# Patient Record
Sex: Male | Born: 1970 | Race: Black or African American | Hispanic: No | Smoking: Current some day smoker
Health system: Southern US, Community
[De-identification: ages and names within clinical notes are randomized; demographics above are authoritative.]

## PROBLEM LIST (undated history)

## (undated) DIAGNOSIS — I639 Cerebral infarction, unspecified: Secondary | ICD-10-CM

## (undated) DIAGNOSIS — I10 Essential (primary) hypertension: Secondary | ICD-10-CM

## (undated) DIAGNOSIS — E119 Type 2 diabetes mellitus without complications: Secondary | ICD-10-CM

## (undated) DIAGNOSIS — Z21 Asymptomatic human immunodeficiency virus [HIV] infection status: Secondary | ICD-10-CM

## (undated) DIAGNOSIS — G510 Bell's palsy: Secondary | ICD-10-CM

## (undated) DIAGNOSIS — B20 Human immunodeficiency virus [HIV] disease: Secondary | ICD-10-CM

## (undated) HISTORY — DX: Bell's palsy: G51.0

## (undated) HISTORY — PX: NO PAST SURGERIES: SHX2092

## (undated) HISTORY — PX: HEMORRHOID SURGERY: SHX153

---

## 1998-12-20 ENCOUNTER — Emergency Department (HOSPITAL_COMMUNITY): Admission: EM | Admit: 1998-12-20 | Discharge: 1998-12-20 | Payer: Self-pay | Admitting: Emergency Medicine

## 1999-11-08 ENCOUNTER — Emergency Department (HOSPITAL_COMMUNITY): Admission: EM | Admit: 1999-11-08 | Discharge: 1999-11-08 | Payer: Self-pay | Admitting: Emergency Medicine

## 1999-11-10 ENCOUNTER — Ambulatory Visit (HOSPITAL_COMMUNITY): Admission: RE | Admit: 1999-11-10 | Discharge: 1999-11-10 | Payer: Self-pay | Admitting: Family Medicine

## 1999-11-10 ENCOUNTER — Encounter: Payer: Self-pay | Admitting: Family Medicine

## 2001-04-10 ENCOUNTER — Emergency Department (HOSPITAL_COMMUNITY): Admission: EM | Admit: 2001-04-10 | Discharge: 2001-04-10 | Payer: Self-pay | Admitting: Emergency Medicine

## 2001-04-10 ENCOUNTER — Encounter: Payer: Self-pay | Admitting: Emergency Medicine

## 2001-12-28 ENCOUNTER — Encounter: Payer: Self-pay | Admitting: Emergency Medicine

## 2001-12-28 ENCOUNTER — Emergency Department (HOSPITAL_COMMUNITY): Admission: EM | Admit: 2001-12-28 | Discharge: 2001-12-28 | Payer: Self-pay | Admitting: Emergency Medicine

## 2002-07-02 ENCOUNTER — Emergency Department (HOSPITAL_COMMUNITY): Admission: EM | Admit: 2002-07-02 | Discharge: 2002-07-02 | Payer: Self-pay

## 2003-04-20 ENCOUNTER — Emergency Department (HOSPITAL_COMMUNITY): Admission: EM | Admit: 2003-04-20 | Discharge: 2003-04-20 | Payer: Self-pay | Admitting: Emergency Medicine

## 2003-05-04 ENCOUNTER — Emergency Department (HOSPITAL_COMMUNITY): Admission: EM | Admit: 2003-05-04 | Discharge: 2003-05-04 | Payer: Self-pay | Admitting: Family Medicine

## 2003-10-05 ENCOUNTER — Emergency Department (HOSPITAL_COMMUNITY): Admission: EM | Admit: 2003-10-05 | Discharge: 2003-10-05 | Payer: Self-pay | Admitting: Emergency Medicine

## 2005-09-21 ENCOUNTER — Emergency Department (HOSPITAL_COMMUNITY): Admission: EM | Admit: 2005-09-21 | Discharge: 2005-09-21 | Payer: Self-pay | Admitting: Emergency Medicine

## 2005-09-22 ENCOUNTER — Emergency Department (HOSPITAL_COMMUNITY): Admission: EM | Admit: 2005-09-22 | Discharge: 2005-09-22 | Payer: Self-pay | Admitting: Emergency Medicine

## 2007-04-12 ENCOUNTER — Emergency Department (HOSPITAL_COMMUNITY): Admission: EM | Admit: 2007-04-12 | Discharge: 2007-04-12 | Payer: Self-pay | Admitting: Emergency Medicine

## 2008-03-11 ENCOUNTER — Emergency Department (HOSPITAL_COMMUNITY): Admission: EM | Admit: 2008-03-11 | Discharge: 2008-03-11 | Payer: Self-pay | Admitting: Emergency Medicine

## 2009-02-25 ENCOUNTER — Emergency Department (HOSPITAL_COMMUNITY): Admission: EM | Admit: 2009-02-25 | Discharge: 2009-02-25 | Payer: Self-pay | Admitting: Emergency Medicine

## 2009-10-01 ENCOUNTER — Emergency Department (HOSPITAL_COMMUNITY): Admission: EM | Admit: 2009-10-01 | Discharge: 2009-10-01 | Payer: Self-pay | Admitting: Emergency Medicine

## 2009-10-09 ENCOUNTER — Emergency Department (HOSPITAL_COMMUNITY): Admission: EM | Admit: 2009-10-09 | Discharge: 2009-10-09 | Payer: Self-pay | Admitting: Emergency Medicine

## 2010-06-17 LAB — URINE MICROSCOPIC-ADD ON

## 2010-06-17 LAB — URINALYSIS, ROUTINE W REFLEX MICROSCOPIC
Bilirubin Urine: NEGATIVE
Glucose, UA: NEGATIVE mg/dL
Hgb urine dipstick: NEGATIVE
Ketones, ur: NEGATIVE mg/dL
Nitrite: NEGATIVE
Protein, ur: 30 mg/dL — AB
Specific Gravity, Urine: 1.011 (ref 1.005–1.030)
Urobilinogen, UA: 0.2 mg/dL (ref 0.0–1.0)
pH: 8 (ref 5.0–8.0)

## 2010-06-17 LAB — DIFFERENTIAL
Basophils Absolute: 0 K/uL (ref 0.0–0.1)
Basophils Relative: 0 % (ref 0–1)
Eosinophils Absolute: 0.1 K/uL (ref 0.0–0.7)
Eosinophils Relative: 1 % (ref 0–5)
Lymphocytes Relative: 47 % — ABNORMAL HIGH (ref 12–46)
Lymphs Abs: 3.9 K/uL (ref 0.7–4.0)
Monocytes Absolute: 0.9 K/uL (ref 0.1–1.0)
Monocytes Relative: 10 % (ref 3–12)
Neutro Abs: 3.4 K/uL (ref 1.7–7.7)
Neutrophils Relative %: 41 % — ABNORMAL LOW (ref 43–77)

## 2010-06-17 LAB — COMPREHENSIVE METABOLIC PANEL
ALT: 25 U/L (ref 0–53)
Alkaline Phosphatase: 60 U/L (ref 39–117)
BUN: 7 mg/dL (ref 6–23)
CO2: 26 mEq/L (ref 19–32)
Creatinine, Ser: 0.96 mg/dL (ref 0.4–1.5)
Total Bilirubin: 0.7 mg/dL (ref 0.3–1.2)

## 2010-06-17 LAB — HEMOCCULT GUIAC POC 1CARD (OFFICE): Fecal Occult Bld: NEGATIVE

## 2010-06-17 LAB — CBC
Hemoglobin: 16.5 g/dL (ref 13.0–17.0)
RBC: 5.56 MIL/uL (ref 4.22–5.81)
RDW: 13.7 % (ref 11.5–15.5)
WBC: 8.2 10*3/uL (ref 4.0–10.5)

## 2010-06-17 LAB — LIPASE, BLOOD: Lipase: 32 U/L (ref 11–59)

## 2010-12-19 LAB — DIFFERENTIAL
Basophils Relative: 1 % (ref 0–1)
Lymphocytes Relative: 48 % — ABNORMAL HIGH (ref 12–46)
Monocytes Absolute: 1.2 10*3/uL — ABNORMAL HIGH (ref 0.1–1.0)
Monocytes Relative: 10 % (ref 3–12)

## 2010-12-19 LAB — BASIC METABOLIC PANEL
Creatinine, Ser: 1 mg/dL (ref 0.4–1.5)
GFR calc Af Amer: >60 mL/min (ref >60)
GFR calc non Af Amer: >60 mL/min (ref >60)
Potassium: 3.6 mEq/L (ref 3.5–5.1)

## 2010-12-19 LAB — CBC
HCT: 45 % (ref 39.0–52.0)
MCV: 86.3 fL (ref 78.0–100.0)

## 2011-01-20 ENCOUNTER — Emergency Department (HOSPITAL_COMMUNITY)
Admission: EM | Admit: 2011-01-20 | Discharge: 2011-01-21 | Disposition: A | Discharge status: Home / Self Care | Payer: Self-pay | Attending: Emergency Medicine | Admitting: Emergency Medicine

## 2011-01-20 ENCOUNTER — Encounter: Payer: Self-pay | Admitting: *Deleted

## 2011-01-20 DIAGNOSIS — Z79899 Other long term (current) drug therapy: Secondary | ICD-10-CM | POA: Insufficient documentation

## 2011-01-20 DIAGNOSIS — R220 Localized swelling, mass and lump, head: Secondary | ICD-10-CM | POA: Insufficient documentation

## 2011-01-20 DIAGNOSIS — F17200 Nicotine dependence, unspecified, uncomplicated: Secondary | ICD-10-CM | POA: Insufficient documentation

## 2011-01-20 DIAGNOSIS — K047 Periapical abscess without sinus: Secondary | ICD-10-CM | POA: Insufficient documentation

## 2011-01-20 DIAGNOSIS — R221 Localized swelling, mass and lump, neck: Secondary | ICD-10-CM | POA: Insufficient documentation

## 2011-01-20 NOTE — ED Notes (Signed)
Rt face swollen this am with pain.

## 2011-01-21 MED ORDER — PENICILLIN V POTASSIUM 500 MG PO TABS: 500.0000 mg | ORAL_TABLET | Freq: Four times a day (QID) | ORAL | Status: AC

## 2011-01-21 MED ORDER — OXYCODONE-ACETAMINOPHEN 5-325 MG PO TABS
1.0000 | ORAL_TABLET | Freq: Once | ORAL | Status: AC
  Administered 2011-01-21: 1 via ORAL
  Filled 2011-01-21: qty 1

## 2011-01-21 MED ORDER — OXYCODONE-ACETAMINOPHEN 5-325 MG PO TABS: 1.0000 | ORAL_TABLET | ORAL | Status: AC | PRN

## 2011-01-21 NOTE — ED Notes (Signed)
To CT

## 2011-01-21 NOTE — ED Notes (Signed)
Mentioned L jaw swelling (in previous note) was d/t positioning, no L jaw swelling noted, swelling present and significant in the R maxillary sinus area, (R of nose), (denies: change in hearing or vision, fever, nvd, drainage in mouth nose or externally). Noticed last week, but went away and returned yesterday, "rapidly progressively worse".

## 2011-01-21 NOTE — ED Notes (Signed)
L jaw noted to be swollen.

## 2011-01-21 NOTE — ED Notes (Signed)
Alert, interactive, calm, skin W&D, resps e/u, NAD, speaking in clear complete sentences, sitting on edge of bed, family at [facility]. [facility].

## 2011-01-21 NOTE — ED Notes (Signed)
Alert, NAD, calm, interactive.  

## 2011-01-21 NOTE — ED Provider Notes (Signed)
History     CSN: [id number] Arrival date & time: 01/20/2011  9:15 PM   First MD Initiated Contact with Patient 01/21/11 0155      Chief Complaint  Patient presents with  . Facial Swelling    (Consider location/radiation/quality/duration/timing/severity/associated sxs/prior treatment) The history is provided by the patient.   patient has had swelling in his right face for the last day or 2 with increasing pain. No fevers. He states he is missing some fillings on that side. States he is able to eat he just chews on that side. No fevers. No drainage no trouble seeing. No eye pain.  History reviewed. No pertinent past medical history.  History reviewed. No pertinent past surgical history.  No family history on file.  History  Substance Use Topics  . Smoking status: Current Everyday Smoker  . Smokeless tobacco: Not on file  . Alcohol Use: Yes      Review of Systems  Constitutional: Negative for activity change and appetite change.  HENT: Positive for facial swelling. Negative for ear pain, drooling, mouth sores and ear discharge.   Respiratory: Negative for choking.   Cardiovascular: Negative for chest pain.    Allergies  Review of patient's allergies indicates no known allergies.  Home Medications   Current Outpatient Rx  Name Route Sig Dispense Refill  . ACETAMINOPHEN 500 MG PO TABS Oral Take 1,000 mg by mouth 3 (three) times daily as needed. For pain     . BC HEADACHE POWDER PO Oral Take 1 packet by mouth 2 (two) times daily as needed. For pain     . OXYCODONE-ACETAMINOPHEN 5-325 MG PO TABS Oral Take 1 tablet by mouth every 4 (four) hours as needed for pain. 15 tablet 0  . PENICILLIN V POTASSIUM 500 MG PO TABS Oral Take 1 tablet (500 mg total) by mouth 4 (four) times daily. 40 tablet 0    BP 161/99  Pulse 86  Temp(Src) 98.6 F (37 C) (Oral)  Resp 20  Ht 5\' 7"  (1.702 m)  Wt 192 lb (87.091 kg)  BMI 30.07 kg/m2  SpO2 97%  Physical Exam  Constitutional: He  appears well-developed and well-nourished.  HENT:       Swelling of the right cheek. Fluctuant mass in the right upper jaw area on the buchal side. Mild erythema. Overall good dentition. No palate swelling.  Eyes: Pupils are equal, round, and reactive to light.  Neck: Normal range of motion.  Cardiovascular: Normal rate.     ED Course  INCISION AND DRAINAGE Date/Time: 01/21/2011 3:00 AM Performed by: Benjiman Core R. Authorized by: Billee Cashing Consent: Verbal consent obtained. Written consent not obtained. Risks and benefits: risks, benefits and alternatives were discussed Consent given by: patient Patient understanding: patient states understanding of the procedure being performed Patient consent: the patient's understanding of the procedure matches consent given Procedure consent: procedure consent matches procedure scheduled Relevant documents: relevant documents present and verified Test results: test results available and properly labeled Site marked: the operative site was not marked Imaging studies: imaging studies not available Required items: required blood products, implants, devices, and special equipment available Patient identity confirmed: verbally with patient and arm band Time out: Immediately prior to procedure a "time out" was called to verify the correct patient, procedure, equipment, support staff and site/side marked as required. Type: abscess Body area: mouth Location details: alveolar process Anesthesia: local infiltration Local anesthetic: lidocaine 2% with epinephrine Anesthetic total: 2 ml Patient sedated: no Scalpel size: 11 Incision  type: single straight Complexity: simple Drainage: purulent Drainage amount: moderate Wound treatment: wound left open Patient tolerance: Patient tolerated the procedure well with no immediate complications.   (including critical care time)  Labs Reviewed - No data to display No results found.   1.  Dental abscess       MDM  Right-sided dental abscess post drainage. Patient feels better after draining. We'll treat with antibiotics and pain relief. Dental followup.        Juliet Rude. Rubin Payor, MD 01/21/11 [phone]

## 2011-01-21 NOTE — ED Notes (Signed)
I&D tray at [facility], EDP Dr. Rubin Payor into see & update pt.

## 2011-04-27 ENCOUNTER — Emergency Department (HOSPITAL_COMMUNITY)
Admission: EM | Admit: 2011-04-27 | Discharge: 2011-04-27 | Disposition: A | Discharge status: Home / Self Care | Payer: Self-pay | Attending: Emergency Medicine | Admitting: Emergency Medicine

## 2011-04-27 ENCOUNTER — Encounter (HOSPITAL_COMMUNITY): Payer: Self-pay | Admitting: Emergency Medicine

## 2011-04-27 DIAGNOSIS — K029 Dental caries, unspecified: Secondary | ICD-10-CM | POA: Insufficient documentation

## 2011-04-27 DIAGNOSIS — K089 Disorder of teeth and supporting structures, unspecified: Secondary | ICD-10-CM | POA: Insufficient documentation

## 2011-04-27 MED ORDER — OXYCODONE-ACETAMINOPHEN 5-325 MG PO TABS: 2.0000 | ORAL_TABLET | ORAL | Status: AC | PRN

## 2011-04-27 MED ORDER — OXYCODONE-ACETAMINOPHEN 5-325 MG PO TABS
1.0000 | ORAL_TABLET | Freq: Once | ORAL | Status: AC
  Administered 2011-04-27: 1 via ORAL
  Filled 2011-04-27: qty 1

## 2011-04-27 MED ORDER — PENICILLIN V POTASSIUM 250 MG PO TABS
500.0000 mg | ORAL_TABLET | Freq: Once | ORAL | Status: AC
  Administered 2011-04-27: 500 mg via ORAL
  Filled 2011-04-27: qty 2

## 2011-04-27 MED ORDER — PENICILLIN V POTASSIUM 500 MG PO TABS: 500.0000 mg | ORAL_TABLET | Freq: Four times a day (QID) | ORAL | Status: AC

## 2011-04-27 NOTE — ED Notes (Signed)
RT UPPER TOOTH PAIN SINCE SAT

## 2011-04-27 NOTE — ED Provider Notes (Signed)
History     CSN: [id number]  Arrival date & time 04/27/11  1514   First MD Initiated Contact with Patient 04/27/11 1746      Chief Complaint  Patient presents with  . Dental Pain    (Consider location/radiation/quality/duration/timing/severity/associated sxs/prior treatment) Patient is a 41 y.o. male presenting with tooth pain. The history is provided by the patient.  Dental PainPrimary symptoms do not include headaches, fever or sore throat.   the patient is a 41 year old, male, with no significant past medical history, who presents to the ER complaining of pain in his right upper molar for the past 2 days.  He had a filling that fell out.  A long time ago.  He smokes cigarettes.  He denies nausea, vomiting, fevers, chills, or trauma.  He tried to take Tylenol, but did not get any relief so he came to the emergency department for care.  History reviewed. No pertinent past medical history.  History reviewed. No pertinent past surgical history.  No family history on file.  History  Substance Use Topics  . Smoking status: Current Everyday Smoker  . Smokeless tobacco: Not on file  . Alcohol Use: Yes      Review of Systems  Constitutional: Negative for fever and chills.  HENT: Positive for dental problem. Negative for sore throat and voice change.   Gastrointestinal: Negative for nausea and vomiting.  Neurological: Negative for weakness and headaches.  All other systems reviewed and are negative.    Allergies  Review of patient's allergies indicates no known allergies.  Home Medications   Current Outpatient Rx  Name Route Sig Dispense Refill  . ACETAMINOPHEN 500 MG PO TABS Oral Take 1,000 mg by mouth 3 (three) times daily as needed. For pain     . BC HEADACHE POWDER PO Oral Take 1 packet by mouth 2 (two) times daily as needed. For headache      BP 147/98  Pulse 65  Temp 98.7 F (37.1 C)  Resp 15  SpO2 100%  Physical Exam  Vitals reviewed. Constitutional: He  is oriented to person, place, and time. He appears well-developed and well-nourished.  HENT:  Head: Normocephalic and atraumatic.  Nose: Nose normal.       #1 tooth tender to palpation without visible abscess. Widespread dental caries  Eyes: Conjunctivae are normal. Pupils are equal, round, and reactive to light.  Neck: Normal range of motion. Neck supple.  Pulmonary/Chest: Effort normal.  Musculoskeletal: Normal range of motion.  Neurological: He is alert and oriented to person, place, and time.  Skin: Skin is warm and dry.  Psychiatric: He has a normal mood and affect.    ED Course  Procedures (including critical care time) Dental caries with probable periapical abscess.  Labs Reviewed - No data to display No results found.   No diagnosis found.    MDM  Dental caries Nontoxic, with no airway obstruction.        Nicholes Stairs, MD 04/27/11 [phone]

## 2012-02-08 ENCOUNTER — Telehealth: Payer: Self-pay

## 2012-02-08 NOTE — Telephone Encounter (Signed)
Left message on patient's voicemail . Call to schedule intake .  Laurell Josephs, RN

## 2012-02-29 ENCOUNTER — Telehealth: Payer: Self-pay

## 2012-02-29 NOTE — Telephone Encounter (Signed)
Message left on patients voice mail. This is second attempt.  If a return call is not received . I will make a Paramedic referral.  Micalah Cabezas,RN

## 2012-03-03 ENCOUNTER — Ambulatory Visit (INDEPENDENT_AMBULATORY_CARE_PROVIDER_SITE_OTHER): Payer: Self-pay

## 2012-03-03 ENCOUNTER — Ambulatory Visit: Payer: Self-pay

## 2012-03-03 DIAGNOSIS — Z79899 Other long term (current) drug therapy: Secondary | ICD-10-CM

## 2012-03-03 DIAGNOSIS — B20 Human immunodeficiency virus [HIV] disease: Secondary | ICD-10-CM

## 2012-03-03 DIAGNOSIS — Z113 Encounter for screening for infections with a predominantly sexual mode of transmission: Secondary | ICD-10-CM

## 2012-03-03 LAB — CBC WITH DIFFERENTIAL/PLATELET
Basophils Relative: 0 % (ref 0–1)
Eosinophils Absolute: 0.2 10*3/uL (ref 0.0–0.7)
Hemoglobin: 14.5 g/dL (ref 13.0–17.0)
Lymphocytes Relative: 43 % (ref 12–46)
Lymphs Abs: 2.9 10*3/uL (ref 0.7–4.0)
MCH: 28.2 pg (ref 26.0–34.0)
Monocytes Relative: 9 % (ref 3–12)
Neutro Abs: 3 10*3/uL (ref 1.7–7.7)
Neutrophils Relative %: 45 % (ref 43–77)
RBC: 5.14 MIL/uL (ref 4.22–5.81)

## 2012-03-03 LAB — LIPID PANEL
Cholesterol: 144 mg/dL (ref 0–200)
Total CHOL/HDL Ratio: 6.3 Ratio
Triglycerides: 371 mg/dL — ABNORMAL HIGH (ref <150)
VLDL: 74 mg/dL — ABNORMAL HIGH (ref 0–40)

## 2012-03-04 LAB — URINALYSIS
Bilirubin Urine: NEGATIVE
Glucose, UA: NEGATIVE mg/dL
Protein, ur: NEGATIVE mg/dL
Specific Gravity, Urine: 1.011 (ref 1.005–1.030)
pH: 6 (ref 5.0–8.0)

## 2012-03-04 LAB — COMPLETE METABOLIC PANEL WITH GFR
ALT: 30 U/L (ref 0–53)
Albumin: 4.2 g/dL (ref 3.5–5.2)
Alkaline Phosphatase: 60 U/L (ref 39–117)
CO2: 26 mEq/L (ref 19–32)
Calcium: 8.9 mg/dL (ref 8.4–10.5)
Creat: 0.97 mg/dL (ref 0.50–1.35)
GFR, Est African American: >89 mL/min
Glucose, Bld: 105 mg/dL — ABNORMAL HIGH (ref 70–99)
Potassium: 3.9 mEq/L (ref 3.5–5.3)
Sodium: 138 mEq/L (ref 135–145)
Total Bilirubin: 0.5 mg/dL (ref 0.3–1.2)

## 2012-03-04 LAB — HEPATITIS B CORE ANTIBODY, TOTAL: Hep B Core Total Ab: NEGATIVE

## 2012-03-04 LAB — HIV-1 RNA ULTRAQUANT REFLEX TO GENTYP+: HIV-1 RNA Quant, Log: 4.65 {Log} — ABNORMAL HIGH (ref <1.30)

## 2012-03-10 LAB — HIV-1 GENOTYPR PLUS

## 2012-03-17 ENCOUNTER — Encounter: Payer: Self-pay | Admitting: Internal Medicine

## 2012-03-17 ENCOUNTER — Ambulatory Visit: Payer: Self-pay

## 2012-03-17 ENCOUNTER — Ambulatory Visit (INDEPENDENT_AMBULATORY_CARE_PROVIDER_SITE_OTHER): Payer: Self-pay | Admitting: Internal Medicine

## 2012-03-17 VITALS — BP 171/119 | HR 89 | Temp 98.4°F | Wt 190.0 lb

## 2012-03-17 DIAGNOSIS — B20 Human immunodeficiency virus [HIV] disease: Secondary | ICD-10-CM | POA: Insufficient documentation

## 2012-03-17 DIAGNOSIS — Z23 Encounter for immunization: Secondary | ICD-10-CM

## 2012-03-17 MED ORDER — ELVITEG-COBIC-EMTRICIT-TENOFDF 150-150-200-300 MG PO TABS: 1.0000 | ORAL_TABLET | Freq: Every day | ORAL | Status: DC

## 2012-03-17 MED ORDER — SULFAMETHOXAZOLE-TMP DS 800-160 MG PO TABS: 1.0000 | ORAL_TABLET | Freq: Every day | ORAL | Status: DC

## 2012-03-17 NOTE — Progress Notes (Signed)
Patient states his girlfriend tested positive for herpes in September  and he was told to go to [facility] for testing.  He is surprised about positive test results and slightly depressed.  His male partner tested negative 11-2011   Laurell Josephs, RN

## 2012-03-17 NOTE — Progress Notes (Signed)
HIV CLINIC NOTE  RFV: establishing care Subjective:    Patient ID: Donald Wheeler, male    DOB: 18-May-1970, 42 y.o.   MRN: [id number]  HPI 42 yo M with newly diagnosed in Oct 2013 with HIV,Cd 4 count 180/ 45,000, genotype naive. He states he was tested in the setting of his girlfriend having genital herpes and both tested for HIV. Her test results? Unknown, but he was found out to be positive. No recent illness, fatigue, lymphadenopathy, pneumonia. Still processing his diagnosis. Current Outpatient Prescriptions on File Prior to Visit  Medication Sig Dispense Refill  . acetaminophen (TYLENOL) 500 MG tablet Take 1,000 mg by mouth 3 (three) times daily as needed. For pain       . Aspirin-Salicylamide-Caffeine (BC HEADACHE POWDER PO) Take 1 packet by mouth 2 (two) times daily as needed. For headache       Active Ambulatory Problems    Diagnosis Date Noted  . AIDS 03/17/2012   Resolved Ambulatory Problems    Diagnosis Date Noted  . No Resolved Ambulatory Problems   No Additional Past Medical History   Social hx: works part-time for [employer], smokes cigars. In a relationship for the last 4 yrs. Rare alcohol  Family hx: parents deceased. HTN, DM  Review of Systems Review of Systems  Constitutional: Negative for fever, chills, diaphoresis, activity change, appetite change, fatigue and unexpected weight change.  HENT: Negative for congestion, sore throat, rhinorrhea, sneezing, trouble swallowing and sinus pressure.  Eyes: Negative for photophobia and visual disturbance.  Respiratory: Negative for cough, chest tightness, shortness of breath, wheezing and stridor.  Cardiovascular: Negative for chest pain, palpitations and leg swelling.  Gastrointestinal: Negative for nausea, vomiting, abdominal pain, diarrhea, constipation, blood in stool, abdominal distention and anal bleeding.  Genitourinary: Negative for dysuria, hematuria, flank pain and difficulty urinating.  Musculoskeletal: Negative for  myalgias, back pain, joint swelling, arthralgias and gait problem.  Skin: Negative for color change, pallor, rash and wound.  Neurological: Negative for dizziness, tremors, weakness and light-headedness.  Hematological: Negative for adenopathy. Does not bruise/bleed easily.  Psychiatric/Behavioral: Negative for behavioral problems, confusion, sleep disturbance, dysphoric mood, decreased concentration and agitation.        Objective:   Physical Exam BP 171/119  Pulse 89  Temp 98.4 F (36.9 C) (Oral)  Wt 190 lb (86.183 kg) Physical Exam  Constitutional: He is oriented to person, place, and time. He appears well-developed and well-nourished. No distress.  HENT: + submandibular LAD, NT, mobile Mouth/Throat: Oropharynx is clear and moist. No oropharyngeal exudate.  Cardiovascular: Normal rate, regular rhythm and normal heart sounds. Exam reveals no gallop and no friction rub.  No murmur heard.  Pulmonary/Chest: Effort normal and breath sounds normal. No respiratory distress. He has no wheezes.  Abdominal: Soft. Bowel sounds are normal. He exhibits no distension. There is no tenderness.  Lymphadenopathy:  He has no cervical adenopathy.  Neurological: He is alert and oriented to person, place, and time.  Skin: Skin is warm and dry. No rash noted. No erythema.  Psychiatric: He has a normal mood and affect. His behavior is normal.      Assessment & Plan:  HIV = will submit adap application with rx for stribild. Discussed how it is to be taken, daily, same time of day.  - discussed how HIV is transmitted, time line, importance of meds and adherence  oi proph = bactrim ds daily, to start now.  hiv prevention= to give condoms; protected sex is a must to minimize risk  to his girlfriend  Acceptance of hiv disease= still appears to be processing his diagnosis. Referred to [facility] for counseling.  Health maintenance= to get flu and pneumococcal vax  rtc in 6 wks, (roughly 4 wks after  starting stribild, will draw labs at that visit)

## 2012-03-21 ENCOUNTER — Telehealth: Payer: Self-pay

## 2012-03-21 ENCOUNTER — Ambulatory Visit: Payer: Self-pay

## 2012-03-21 NOTE — Telephone Encounter (Signed)
Patient no showed for adap appt and counseling prior to that -

## 2012-04-28 ENCOUNTER — Ambulatory Visit: Payer: Self-pay

## 2012-04-28 ENCOUNTER — Ambulatory Visit: Payer: Self-pay | Admitting: Internal Medicine

## 2012-04-28 ENCOUNTER — Other Ambulatory Visit: Payer: Self-pay

## 2012-05-04 ENCOUNTER — Telehealth: Payer: Self-pay | Admitting: *Deleted

## 2012-05-04 NOTE — Telephone Encounter (Signed)
Called patient to try and reschedule his recently missed appt (due to weather) and was unable to get him. Someone picked up the line but immediately hung up the phone.

## 2012-08-05 ENCOUNTER — Encounter (HOSPITAL_COMMUNITY): Payer: Self-pay | Admitting: Emergency Medicine

## 2012-08-05 ENCOUNTER — Emergency Department (HOSPITAL_COMMUNITY)
Admission: EM | Admit: 2012-08-05 | Discharge: 2012-08-05 | Disposition: A | Discharge status: Home / Self Care | Payer: Self-pay | Attending: Emergency Medicine | Admitting: Emergency Medicine

## 2012-08-05 DIAGNOSIS — R509 Fever, unspecified: Secondary | ICD-10-CM | POA: Insufficient documentation

## 2012-08-05 DIAGNOSIS — F17200 Nicotine dependence, unspecified, uncomplicated: Secondary | ICD-10-CM | POA: Insufficient documentation

## 2012-08-05 DIAGNOSIS — R059 Cough, unspecified: Secondary | ICD-10-CM | POA: Insufficient documentation

## 2012-08-05 DIAGNOSIS — R1310 Dysphagia, unspecified: Secondary | ICD-10-CM | POA: Insufficient documentation

## 2012-08-05 DIAGNOSIS — J029 Acute pharyngitis, unspecified: Secondary | ICD-10-CM | POA: Insufficient documentation

## 2012-08-05 DIAGNOSIS — Z21 Asymptomatic human immunodeficiency virus [HIV] infection status: Secondary | ICD-10-CM | POA: Insufficient documentation

## 2012-08-05 DIAGNOSIS — R05 Cough: Secondary | ICD-10-CM | POA: Insufficient documentation

## 2012-08-05 LAB — RAPID STREP SCREEN (MED CTR MEBANE ONLY): Streptococcus, Group A Screen (Direct): NEGATIVE

## 2012-08-05 MED ORDER — LIDOCAINE VISCOUS 2 % MT SOLN: 15.0000 mL | OROMUCOSAL | Status: DC | PRN

## 2012-08-05 MED ORDER — LIDOCAINE VISCOUS 2 % MT SOLN
20.0000 mL | Freq: Once | OROMUCOSAL | Status: AC
  Administered 2012-08-05: 20 mL via OROMUCOSAL
  Filled 2012-08-05: qty 15

## 2012-08-05 NOTE — ED Notes (Signed)
PA at bedside.

## 2012-08-05 NOTE — ED Provider Notes (Signed)
History    This chart was scribed for a non-physician practitioner, Wynetta Emery, working with Hurman Horn, MD by Frederik Pear, ED Scribe. This patient was seen in room TR07C/TR07C and the patient's care was started at 2133.   CSN: [id number]  Arrival date & time 08/05/12  2123   First MD Initiated Contact with Patient 08/05/12 2133      Chief Complaint  Patient presents with  . Sore Throat    (Consider location/radiation/quality/duration/timing/severity/associated sxs/prior treatment) The history is provided by the patient and medical records. No language interpreter was used.    HPI Comments: Donald Wheeler is a 42 y.o. male with a h/o of HIV who presents to the Emergency Department complaining of a sudden onset, constant, severe sore throat that began 2 days ago with an associated difficulty swallowing, subjective fever, and intermittent cough. He treated the symptoms with chloraseptic pills with mild relief. He has no h/o of DM. He states that he is not currently being followed by an infectious disease specialist for his HIV and is unable to states when his last T cell counts were taken.   History reviewed. No pertinent past medical history.  History reviewed. No pertinent past surgical history.  No family history on file.  History  Substance Use Topics  . Smoking status: Current Every Day Smoker -- 0.60 packs/day for 6 years    Types: Cigarettes  . Smokeless tobacco: Never Used     Comment: black and milds   . Alcohol Use: Yes     Comment: rarely   beer       Review of Systems  Constitutional: Negative for fever.  HENT: Positive for sore throat and trouble swallowing.   Respiratory: Positive for cough. Negative for shortness of breath.   Cardiovascular: Negative for chest pain.  Gastrointestinal: Negative for nausea, vomiting, abdominal pain and diarrhea.  All other systems reviewed and are negative.    Allergies  Review of patient's allergies indicates no  known allergies.  Home Medications   Current Outpatient Rx  Name  Route  Sig  Dispense  Refill  . acetaminophen (TYLENOL) 500 MG tablet   Oral   Take 1,000 mg by mouth 3 (three) times daily as needed. For pain          . ibuprofen (ADVIL,MOTRIN) 200 MG tablet   Oral   Take 400 mg by mouth every 6 (six) hours as needed for pain.         Marland Kitchen menthol-cetylpyridinium (CEPACOL) 3 MG lozenge   Oral   Take 1 lozenge by mouth as needed (for sore throat).           BP 156/106  Pulse 77  Temp(Src) 98.7 F (37.1 C) (Oral)  Resp 18  SpO2 97%  Physical Exam  Nursing note and vitals reviewed. Constitutional: He is oriented to person, place, and time. He appears well-developed and well-nourished. No distress.  HENT:  Head: Normocephalic.  Mouth/Throat: Posterior oropharyngeal erythema present. No oropharyngeal exudate, posterior oropharyngeal edema or tonsillar abscesses.  Tonsillar hypertrophy. Posterior pharynx is mildy erythematous.  Eyes: Conjunctivae and EOM are normal.  Neck:  Non-Tender anterior cervical lymphadenopathy  Cardiovascular: Normal rate.   Pulmonary/Chest: Effort normal. No stridor.  Musculoskeletal: Normal range of motion.  Lymphadenopathy:    He has cervical adenopathy.  Mild tenderness to palpation of the anterior cervical lymph nodes.  Neurological: He is alert and oriented to person, place, and time.  Psychiatric: He has a normal mood and affect.  ED Course  Procedures (including critical care time)  DIAGNOSTIC STUDIES: Oxygen Saturation is 97% on room air, adequate by my interpretation.    COORDINATION OF CARE:  22:35- Discussed planned course of treatment with the patient, including xylocaine, who is agreeable at this time.   Results for orders placed during the hospital encounter of 08/05/12  RAPID STREP SCREEN      Result Value Range   Streptococcus, Group A Screen (Direct) NEGATIVE  NEGATIVE  CULTURE, GROUP A STREP      Result Value  Range   Specimen Description THROAT     Special Requests CX ADDED AT 2206 ON [id number]     Culture No Beta Hemolytic Streptococci Isolated     Report Status 08/07/2012 FINAL     1. Acute pharyngitis       MDM   Filed Vitals:   08/05/12 2131 08/05/12 2254  BP: 156/106 144/94  Pulse: 77 82  Temp: 98.7 F (37.1 C) 98.5 F (36.9 C)  TempSrc: Oral Oral  Resp: 18 16  SpO2: 97% 96%     Donald Wheeler is a 42 y.o. male  with HIV, noncompliant with antivirals. Last T-cell count is unknown. I had extensive discussions with him on the importance of treating his HIV. Patient has verbalized his understanding and states that he will followup with infectious disease. Physical exam is not consistent with strep, thrush. We'll treat him for a viral pharyngitis.  Medications  lidocaine (XYLOCAINE) 2 % viscous mouth solution 20 mL (20 mLs Mouth/Throat Given 08/05/12 2259)   Discussed case with attending who agrees with plan and stability to d/c to home.    The patient is hemodynamically stable, appropriate for, and amenable to, discharge at this time. Pt verbalized understanding and agrees with care plan. Outpatient follow-up and return precautions given.    Discharge Medication List as of 08/05/2012 10:52 PM    START taking these medications   Details  lidocaine (XYLOCAINE) 2 % solution Take 15 mLs by mouth every 3 (three) hours as needed for pain., Starting 08/05/2012, Until Discontinued, Print        I personally performed the services described in this documentation, which was scribed in my presence. The recorded information has been reviewed and is accurate.         Wynetta Emery, PA-C 08/07/12 2140

## 2012-08-05 NOTE — ED Notes (Signed)
PT. REPORTS SORE THROAT WITH SWELLING " HARD TO SWALLOW' FOR 2 DAYS . DENEIS FEVER OR CHILLS, AIRWAY INTACT / RESPIRATIONS UNLABORED.

## 2012-08-07 LAB — CULTURE, GROUP A STREP

## 2012-08-08 NOTE — ED Provider Notes (Signed)
Medical screening examination/treatment/procedure(s) were performed by non-physician practitioner and as supervising physician I was immediately available for consultation/collaboration.   Hurman Horn, MD 08/08/12 [phone]

## 2012-10-19 ENCOUNTER — Telehealth: Payer: Self-pay | Admitting: *Deleted

## 2012-10-19 NOTE — Telephone Encounter (Signed)
Called patient to schedule a follow up appt and had to leave a message for him to call the office.

## 2013-01-19 ENCOUNTER — Telehealth: Payer: Self-pay | Admitting: *Deleted

## 2013-01-19 NOTE — Telephone Encounter (Signed)
Non working phone numbers

## 2013-01-20 NOTE — Telephone Encounter (Signed)
Referred to [facility] to f/u.

## 2013-02-20 ENCOUNTER — Encounter: Payer: Self-pay | Admitting: Internal Medicine

## 2013-02-20 ENCOUNTER — Ambulatory Visit (INDEPENDENT_AMBULATORY_CARE_PROVIDER_SITE_OTHER): Payer: Self-pay | Admitting: Internal Medicine

## 2013-02-20 VITALS — BP 167/102 | HR 80 | Temp 97.8°F | Wt 189.0 lb

## 2013-02-20 DIAGNOSIS — Z113 Encounter for screening for infections with a predominantly sexual mode of transmission: Secondary | ICD-10-CM

## 2013-02-20 DIAGNOSIS — I10 Essential (primary) hypertension: Secondary | ICD-10-CM

## 2013-02-20 DIAGNOSIS — Z23 Encounter for immunization: Secondary | ICD-10-CM

## 2013-02-20 DIAGNOSIS — Z79899 Other long term (current) drug therapy: Secondary | ICD-10-CM

## 2013-02-20 DIAGNOSIS — B20 Human immunodeficiency virus [HIV] disease: Secondary | ICD-10-CM

## 2013-02-20 LAB — COMPREHENSIVE METABOLIC PANEL
AST: 36 U/L (ref 0–37)
Albumin: 4.1 g/dL (ref 3.5–5.2)
BUN: 11 mg/dL (ref 6–23)
CO2: 30 mEq/L (ref 19–32)
Calcium: 9.1 mg/dL (ref 8.4–10.5)
Chloride: 106 mEq/L (ref 96–112)
Creat: 0.99 mg/dL (ref 0.50–1.35)
Potassium: 4.1 mEq/L (ref 3.5–5.3)
Total Protein: 8.1 g/dL (ref 6.0–8.3)

## 2013-02-20 LAB — CBC WITH DIFFERENTIAL/PLATELET
Basophils Absolute: 0 10*3/uL (ref 0.0–0.1)
HCT: 44.9 % (ref 39.0–52.0)
Hemoglobin: 15.7 g/dL (ref 13.0–17.0)
Lymphocytes Relative: 47 % — ABNORMAL HIGH (ref 12–46)
MCH: 28.8 pg (ref 26.0–34.0)
MCV: 82.4 fL (ref 78.0–100.0)
Monocytes Absolute: 0.6 10*3/uL (ref 0.1–1.0)
Monocytes Relative: 13 % — ABNORMAL HIGH (ref 3–12)
Neutro Abs: 1.9 10*3/uL (ref 1.7–7.7)
WBC: 5 10*3/uL (ref 4.0–10.5)

## 2013-02-20 LAB — LIPID PANEL
HDL: 25 mg/dL — ABNORMAL LOW (ref >39)
LDL Cholesterol: 66 mg/dL (ref 0–99)
Triglycerides: 283 mg/dL — ABNORMAL HIGH (ref <150)

## 2013-02-20 MED ORDER — SULFAMETHOXAZOLE-TRIMETHOPRIM 400-80 MG PO TABS: 1.0000 | ORAL_TABLET | Freq: Two times a day (BID) | ORAL | Status: DC

## 2013-02-20 MED ORDER — ELVITEG-COBIC-EMTRICIT-TENOFDF 150-150-200-300 MG PO TABS: 1.0000 | ORAL_TABLET | Freq: Every day | ORAL | Status: DC

## 2013-02-20 MED ORDER — AMLODIPINE BESYLATE 10 MG PO TABS: 10.0000 mg | ORAL_TABLET | Freq: Every day | ORAL | Status: DC

## 2013-02-20 NOTE — Progress Notes (Signed)
Subjective:    Patient ID: Donald Wheeler, male    DOB: 05/24/1970, 42 y.o.   MRN: [id number]  HPI 42yo M with HIV, CD 4 count of 180/VL 44,939, (last seen in jan 2014) did not start his ART at that time. Comes to clinic as a walk-in. He is in fairly good state of health. Did have an ed visit for pharyngitis.  No Known Allergies Current Outpatient Prescriptions on File Prior to Visit  Medication Sig Dispense Refill  . acetaminophen (TYLENOL) 500 MG tablet Take 1,000 mg by mouth 3 (three) times daily as needed. For pain       . ibuprofen (ADVIL,MOTRIN) 200 MG tablet Take 400 mg by mouth every 6 (six) hours as needed for pain.      Marland Kitchen lidocaine (XYLOCAINE) 2 % solution Take 15 mLs by mouth every 3 (three) hours as needed for pain.  100 mL  0  . menthol-cetylpyridinium (CEPACOL) 3 MG lozenge Take 1 lozenge by mouth as needed (for sore throat).       No current facility-administered medications on file prior to visit.   Active Ambulatory Problems    Diagnosis Date Noted  . AIDS 03/17/2012   Resolved Ambulatory Problems    Diagnosis Date Noted  . No Resolved Ambulatory Problems   No Additional Past Medical History      Review of Systems Review of Systems  Constitutional: Negative for fever, chills, diaphoresis, activity change, appetite change, fatigue and unexpected weight change.  HENT: Negative for congestion, sore throat, rhinorrhea, sneezing, trouble swallowing and sinus pressure.  Eyes: Negative for photophobia and visual disturbance.  Respiratory: Negative for cough, chest tightness, shortness of breath, wheezing and stridor.  Cardiovascular: Negative for chest pain, palpitations and leg swelling.  Gastrointestinal: Negative for nausea, vomiting, abdominal pain, diarrhea, constipation, blood in stool, abdominal distention and anal bleeding.  Genitourinary: Negative for dysuria, hematuria, flank pain and difficulty urinating.  Musculoskeletal: Negative for myalgias, back pain,  joint swelling, arthralgias and gait problem.  Skin: Negative for color change, pallor, rash and wound.  Neurological: Negative for dizziness, tremors, weakness and light-headedness.  Hematological: Negative for adenopathy. Does not bruise/bleed easily.  Psychiatric/Behavioral: Negative for behavioral problems, confusion, sleep disturbance, dysphoric mood, decreased concentration and agitation.       Objective:   Physical Exam BP 167/102  Pulse 80  Temp(Src) 97.8 F (36.6 C) (Oral)  Wt 189 lb (85.73 kg) Physical Exam  Constitutional: He is oriented to person, place, and time. He appears well-developed and well-nourished. No distress.  HENT:  Mouth/Throat: Oropharynx is clear and moist. No oropharyngeal exudate.  Cardiovascular: Normal rate, regular rhythm and normal heart sounds. Exam reveals no gallop and no friction rub.  No murmur heard.  Pulmonary/Chest: Effort normal and breath sounds normal. No respiratory distress. He has no wheezes.  Abdominal: Soft. Bowel sounds are normal. He exhibits no distension. There is no tenderness.  Lymphadenopathy:  He has no cervical adenopathy.  Neurological: He is alert and oriented to person, place, and time.  Skin: Skin is warm and dry. No rash noted. No erythema.  Psychiatric: He has a normal mood and affect. His behavior is normal.       Assessment & Plan:  hiv = uncontrolled, art naive. Will start truvada daily and RLG BID.provided instruction, pillbox and tabs today. Processing adap application to switch to stribild in 1 month.  Will check cbc, cmp, cd 4 cell, and hiv viral load.  oi proph = will give bactrim  ds daily  Health maintnenance = flu and hep a  Htn= will start him on amlodipine 10mg  daily once adap approved  Spent 25 min with patinet with greater than 50% in counseling and adherence for hiv medications  rtc in 4 wks

## 2013-02-20 NOTE — Addendum Note (Signed)
Addended by: Lurlean Leyden on: 02/20/2013 03:30 PM   Modules accepted: Orders

## 2013-02-21 LAB — T-HELPER CELL (CD4) - (RCID CLINIC ONLY)
CD4 % Helper T Cell: 8 % — ABNORMAL LOW (ref 33–55)
CD4 T Cell Abs: 180 /uL — ABNORMAL LOW (ref 400–2700)

## 2013-02-21 LAB — RPR

## 2013-02-21 LAB — HIV-1 RNA QUANT-NO REFLEX-BLD: HIV-1 RNA Quant, Log: 4.75 {Log} — ABNORMAL HIGH (ref <1.30)

## 2013-03-24 ENCOUNTER — Other Ambulatory Visit: Payer: Self-pay | Admitting: *Deleted

## 2013-03-24 DIAGNOSIS — Z79899 Other long term (current) drug therapy: Secondary | ICD-10-CM

## 2013-03-24 DIAGNOSIS — B20 Human immunodeficiency virus [HIV] disease: Secondary | ICD-10-CM

## 2013-03-24 MED ORDER — ELVITEG-COBIC-EMTRICIT-TENOFDF 150-150-200-300 MG PO TABS: 1.0000 | ORAL_TABLET | Freq: Every day | ORAL | Status: DC

## 2013-03-24 MED ORDER — SULFAMETHOXAZOLE-TRIMETHOPRIM 400-80 MG PO TABS: 1.0000 | ORAL_TABLET | Freq: Two times a day (BID) | ORAL | Status: DC

## 2013-04-13 ENCOUNTER — Ambulatory Visit: Payer: Self-pay

## 2013-04-13 ENCOUNTER — Ambulatory Visit: Payer: Self-pay | Admitting: Internal Medicine

## 2013-04-28 ENCOUNTER — Ambulatory Visit: Payer: Self-pay | Admitting: Internal Medicine

## 2013-06-29 ENCOUNTER — Ambulatory Visit: Payer: Self-pay

## 2013-07-01 ENCOUNTER — Emergency Department (HOSPITAL_COMMUNITY): Payer: Self-pay

## 2013-07-01 ENCOUNTER — Encounter (HOSPITAL_COMMUNITY): Payer: Self-pay | Admitting: Emergency Medicine

## 2013-07-01 ENCOUNTER — Telehealth (HOSPITAL_BASED_OUTPATIENT_CLINIC_OR_DEPARTMENT_OTHER): Payer: Self-pay | Admitting: *Deleted

## 2013-07-01 ENCOUNTER — Emergency Department (HOSPITAL_COMMUNITY)
Admission: EM | Admit: 2013-07-01 | Discharge: 2013-07-01 | Disposition: A | Discharge status: Home / Self Care | Payer: Self-pay | Attending: Emergency Medicine | Admitting: Emergency Medicine

## 2013-07-01 DIAGNOSIS — G510 Bell's palsy: Secondary | ICD-10-CM | POA: Insufficient documentation

## 2013-07-01 DIAGNOSIS — F17200 Nicotine dependence, unspecified, uncomplicated: Secondary | ICD-10-CM | POA: Insufficient documentation

## 2013-07-01 DIAGNOSIS — I10 Essential (primary) hypertension: Secondary | ICD-10-CM | POA: Insufficient documentation

## 2013-07-01 LAB — I-STAT CHEM 8, ED
BUN: 9 mg/dL (ref 6–23)
CALCIUM ION: 1.13 mmol/L (ref 1.12–1.23)
Chloride: 102 mEq/L (ref 96–112)
Creatinine, Ser: 1.1 mg/dL (ref 0.50–1.35)
Glucose, Bld: 66 mg/dL — ABNORMAL LOW (ref 70–99)
HCT: 47 % (ref 39.0–52.0)
HEMOGLOBIN: 16 g/dL (ref 13.0–17.0)
Potassium: 3.7 mEq/L (ref 3.7–5.3)
SODIUM: 143 meq/L (ref 137–147)
TCO2: 28 mmol/L (ref 0–100)

## 2013-07-01 LAB — CBG MONITORING, ED: Glucose-Capillary: 65 mg/dL — ABNORMAL LOW (ref 70–99)

## 2013-07-01 MED ORDER — HYDROCHLOROTHIAZIDE 25 MG PO TABS: 25.0000 mg | ORAL_TABLET | Freq: Every day | ORAL | Status: DC

## 2013-07-01 MED ORDER — PREDNISONE 20 MG PO TABS: ORAL_TABLET | ORAL | Status: DC

## 2013-07-01 MED ORDER — SODIUM CHLORIDE 0.9 % IV BOLUS (SEPSIS)
500.0000 mL | Freq: Once | INTRAVENOUS | Status: AC
Start: 2013-07-01 — End: 2013-07-01
  Administered 2013-07-01: 500 mL via INTRAVENOUS

## 2013-07-01 MED ORDER — ACYCLOVIR 400 MG PO TABS: 400.0000 mg | ORAL_TABLET | Freq: Every day | ORAL | Status: AC

## 2013-07-01 MED ORDER — HYDRALAZINE HCL 20 MG/ML IJ SOLN
5.0000 mg | Freq: Once | INTRAMUSCULAR | Status: AC
  Administered 2013-07-01: 5 mg via INTRAVENOUS
  Filled 2013-07-01: qty 1

## 2013-07-01 NOTE — ED Provider Notes (Signed)
CSN: [id number]     Arrival date & time 07/01/13  0920 History   First MD Initiated Contact with Patient 07/01/13 1016     Chief Complaint  Patient presents with  . Facial Droop     (Consider location/radiation/quality/duration/timing/severity/associated sxs/prior Treatment) Patient is a 43 y.o. male presenting with neurologic complaint. The history is provided by the patient.  Neurologic Problem This is a new problem. Episode onset: 3 days ago. The problem occurs constantly. The problem has not changed since onset.Pertinent negatives include no chest pain, no abdominal pain, no headaches and no shortness of breath. Nothing aggravates the symptoms. Nothing relieves the symptoms. He has tried nothing for the symptoms. The treatment provided no relief.    History reviewed. No pertinent past medical history. History reviewed. No pertinent past surgical history. No family history on file. History  Substance Use Topics  . Smoking status: Current Every Day Smoker -- 0.60 packs/day for 6 years    Types: Cigarettes  . Smokeless tobacco: Never Used     Comment: black and milds   . Alcohol Use: Yes     Comment: rarely   beer     Review of Systems  Constitutional: Negative for fever.  HENT: Negative for drooling and rhinorrhea.   Eyes: Negative for pain.  Respiratory: Negative for cough and shortness of breath.   Cardiovascular: Negative for chest pain and leg swelling.  Gastrointestinal: Negative for nausea, vomiting, abdominal pain and diarrhea.  Genitourinary: Negative for dysuria and hematuria.  Musculoskeletal: Negative for gait problem and neck pain.  Skin: Negative for color change.  Neurological: Negative for numbness and headaches.  Hematological: Negative for adenopathy.  Psychiatric/Behavioral: Negative for behavioral problems.  All other systems reviewed and are negative.     Allergies  Review of patient's allergies indicates no known allergies.  Home Medications    Prior to Admission medications   Medication Sig Start Date End Date Taking? Authorizing Provider  acetaminophen (TYLENOL) 500 MG tablet Take 1,000 mg by mouth 3 (three) times daily as needed. For pain     Historical Provider, MD  amLODipine (NORVASC) 10 MG tablet Take 1 tablet (10 mg total) by mouth daily. 02/20/13   Judyann Munsonynthia Snider, MD  elvitegravir-cobicistat-emtricitabine-tenofovir (STRIBILD) 150-150-200-300 MG TABS tablet Take 1 tablet by mouth daily with breakfast. 03/24/13   Judyann Munsonynthia Snider, MD  ibuprofen (ADVIL,MOTRIN) 200 MG tablet Take 400 mg by mouth every 6 (six) hours as needed for pain.    Historical Provider, MD  lidocaine (XYLOCAINE) 2 % solution Take 15 mLs by mouth every 3 (three) hours as needed for pain. 08/05/12   Nicole Pisciotta, PA-C  menthol-cetylpyridinium (CEPACOL) 3 MG lozenge Take 1 lozenge by mouth as needed (for sore throat).    Historical Provider, MD  sulfamethoxazole-trimethoprim (SEPTRA) 400-80 MG per tablet Take 1 tablet by mouth 2 (two) times daily. 03/24/13   Judyann Munsonynthia Snider, MD   BP 161/108  Pulse 86  Temp(Src) 98.8 F (37.1 C)  Resp 27  Ht 5\' 7"  (1.702 m)  Wt 192 lb (87.091 kg)  BMI 30.06 kg/m2  SpO2 100% Physical Exam  Nursing note and vitals reviewed. Constitutional: He is oriented to person, place, and time. He appears well-developed and well-nourished.  HENT:  Head: Normocephalic and atraumatic.  Right Ear: External ear normal.  Left Ear: External ear normal.  Nose: Nose normal.  Mouth/Throat: Oropharynx is clear and moist. No oropharyngeal exudate.  No vesicular lesions noted on the face or ears.   Eyes: Conjunctivae  and EOM are normal. Pupils are equal, round, and reactive to light.  Neck: Normal range of motion. Neck supple.  Cardiovascular: Normal rate, regular rhythm, normal heart sounds and intact distal pulses.  Exam reveals no gallop and no friction rub.   No murmur heard. Pulmonary/Chest: Effort normal and breath sounds normal. No  respiratory distress. He has no wheezes.  Abdominal: Soft. Bowel sounds are normal. He exhibits no distension. There is no tenderness. There is no rebound and no guarding.  Musculoskeletal: Normal range of motion. He exhibits no edema and no tenderness.  Neurological: He is alert and oriented to person, place, and time. He has normal strength. No cranial nerve deficit or sensory deficit. He displays a negative Romberg sign. Coordination and gait normal.  alert, oriented x3 speech: normal in context and clarity memory: intact grossly cranial nerves II-XII: left sided facial paralysis, pt is able to close left eye, unable to furrow left brow or raise left eyebrow. Peripheral fields intact. Sensation intact on the face diffusely.  motor strength: full proximally and distally no involuntary movements or tremors sensation: intact to light touch diffusely  cerebellar: finger-to-nose and heel-to-shin intact gait: normal forwards and backwards, normal tandem gait   Skin: Skin is warm and dry.  Psychiatric: He has a normal mood and affect. His behavior is normal.    ED Course  Procedures (including critical care time) Labs Review Labs Reviewed  CBG MONITORING, ED - Abnormal; Notable for the following:    Glucose-Capillary 65 (*)    All other components within normal limits  I-STAT CHEM 8, ED - Abnormal; Notable for the following:    Glucose, Bld 66 (*)    All other components within normal limits    Imaging Review Ct Head Wo Contrast  07/01/2013   CLINICAL DATA:  Left-sided facial droop since Wednesday, history of hypertension and HIV  EXAM: CT HEAD WITHOUT CONTRAST  TECHNIQUE: Contiguous axial images were obtained from the base of the skull through the vertex without intravenous contrast.  COMPARISON:  None.  FINDINGS: Gray-white differentiation is maintained. No CT evidence of acute large territory infarct. No intraparenchymal or extra-axial mass or hemorrhage. There is mild diffuse  increased attenuation of the intracranial blood pool suggestive of volume depletion. Normal size and configuration of the ventricles and basal cisterns. No midline shift. Polypoid mucosal thickening within the sphenoid sinus. The remaining paranasal sinuses and mastoid air cells are normally aerated. Regional soft tissues appear normal. No displaced calvarial fracture.  IMPRESSION: Negative noncontrast head CT.   Electronically Signed   By: Simonne Come M.D.   On: 07/01/2013 12:22     EKG Interpretation None      MDM   Final diagnoses:  Bell's palsy  HTN (hypertension)    10:55 AM 43 y.o. male who presents with left-sided facial droop which was first noticed 3 days ago upon awakening. He denies any numbness or weakness. He is afebrile and hypertensive here. He denies any medical problems. He denies any headaches. He has otherwise been well. He appears to have paralysis of the facial muscles of the left side of his face including the forehead. He is able to close his left eye. I suspect Bell's palsy but will get CT of head.  I discussed case w/ Neurohospitalist. Suspect Bell's Palsy. CT neg.   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 Will tx w/ steroids and acyclovir. Will start on hctz for elevated BP. I have discussed the diagnosis/risks/treatment  options with the patient and believe the pt to be eligible for discharge home to follow-up with Neurology and establish w/ a pcp. We also discussed returning to the ED immediately if new or worsening sx occur. We discussed the sx which are most concerning (e.g., weakness, numbness) that necessitate immediate return. Medications administered to the patient during their visit and any new prescriptions provided to the patient are listed below.  Medications given during this visit Medications  hydrALAZINE (APRESOLINE) injection 5 mg (5 mg Intravenous Given 07/01/13 1110)  sodium chloride 0.9 % bolus 500 mL (0 mLs Intravenous Stopped  07/01/13 1214)    New Prescriptions   ACYCLOVIR (ZOVIRAX) 400 MG TABLET    Take 1 tablet (400 mg total) by mouth 5 (five) times daily.   HYDROCHLOROTHIAZIDE (HYDRODIURIL) 25 MG TABLET    Take 1 tablet (25 mg total) by mouth daily.   PREDNISONE (DELTASONE) 20 MG TABLET    Take 60 mg or 3 tablets by mouth daily for 7 days.     Junius ArgyleForrest S Tanay Misuraca, MD 07/02/13 1044

## 2013-07-01 NOTE — ED Notes (Signed)
MD at bedside. 

## 2013-07-01 NOTE — ED Notes (Signed)
CBG 65 

## 2013-07-01 NOTE — Telephone Encounter (Signed)
Pt calling to get help with purchasing medications,  Pt sts cannot afford meds and has no insurance.  Will call Case Manager in AM to help with Match program.

## 2013-07-01 NOTE — ED Notes (Signed)
Pt c/o left sided facial droop onset Wednesday. Pt reports he woke up Wednesday with the droop. Pt speech clear, equal grips.

## 2013-07-01 NOTE — Discharge Instructions (Signed)
Bell's Palsy Bell's palsy is a condition in which the muscles on one side of the face cannot move (paralysis). This is because the nerves in the face are paralyzed. It is most often thought to be caused by a virus. The virus causes swelling of the nerve that controls movement on one side of the face. The nerve travels through a tight space surrounded by bone. When the nerve swells, it can be compressed by the bone. This results in damage to the protective covering around the nerve. This damage interferes with how the nerve communicates with the muscles of the face. As a result, it can cause weakness or paralysis of the facial muscles.  Injury (trauma), tumor, and surgery may cause Bell's palsy, but most of the time the cause is unknown. It is a relatively common condition. It starts suddenly (abrupt onset) with the paralysis usually ending within 2 days. Bell's palsy is not dangerous. But because the eye does not close properly, you may need care to keep the eye from getting dry. This can include splinting (to keep the eye shut) or moistening with artificial tears. Bell's palsy very seldom occurs on both sides of the face at the same time. SYMPTOMS   Eyebrow sagging.  Drooping of the eyelid and corner of the mouth.  Inability to close one eye.  Loss of taste on the front of the tongue.  Sensitivity to loud noises. TREATMENT  The treatment is usually non-surgical. If the patient is seen within the first 24 to 48 hours, a short course of steroids may be prescribed, in an attempt to shorten the length of the condition. Antiviral medicines may also be used with the steroids, but it is unclear if they are helpful.  You will need to protect your eye, if you cannot close it. The cornea (clear covering over your eye) will become dry and can be damaged. Artificial tears can be used to keep your eye moist. Glasses or an eye patch should be worn to protect your eye. PROGNOSIS  Recovery is variable, ranging  from days to months. Although the problem usually goes away completely (about 80% of cases resolve), predicting the outcome is impossible. Most people improve within 3 weeks of when the symptoms began. Improvement may continue for 3 to 6 months. A small number of people have moderate to severe weakness that is permanent.  HOME CARE INSTRUCTIONS   If your caregiver prescribed medication to reduce swelling in the nerve, use as directed. Do not stop taking the medication unless directed by your caregiver.  Use moisturizing eye drops as needed to prevent drying of your eye, as directed by your caregiver.  Protect your eye, as directed by your caregiver.  Use facial massage and exercises, as directed by your caregiver.  Perform your normal activities, and get your normal rest. SEEK IMMEDIATE MEDICAL CARE IF:   There is pain, redness or irritation in the eye.  You or your child has an oral temperature above 102 F (38.9 C), not controlled by medicine. MAKE SURE YOU:   Understand these instructions.  Will watch your condition.  Will get help right away if you are not doing well or get worse. Document Released: 03/02/2005 Document Revised: 05/25/2011 Document Reviewed: 03/11/2009 [facility] Patient Information 2014 [place], [place].  Arterial Hypertension Arterial hypertension (high blood pressure) is a condition of elevated pressure in your blood vessels. Hypertension over a long period of time is a risk factor for strokes, heart attacks, and heart failure. It is  also the leading cause of kidney (renal) failure.  CAUSES   In Adults -- Over 90% of all hypertension has no known cause. This is called essential or primary hypertension. In the other 10% of people with hypertension, the increase in blood pressure is caused by another disorder. This is called secondary hypertension. Important causes of secondary hypertension are:  Heavy alcohol use.  Obstructive sleep apnea.  Hyperaldosterosim  (Conn's syndrome).  Steroid use.  Chronic kidney failure.  Hyperparathyroidism.  Medications.  Renal artery stenosis.  Pheochromocytoma.  Cushing's disease.  Coarctation of the aorta.  Scleroderma renal crisis.  Licorice (in excessive amounts).  Drugs (cocaine, methamphetamine). Your caregiver can explain any items above that apply to you.  In Children -- Secondary hypertension is more common and should always be considered.  Pregnancy -- Few women of childbearing age have high blood pressure. However, up to 10% of them develop hypertension of pregnancy. Generally, this will not harm the woman. It may be a sign of 3 complications of pregnancy: preeclampsia, HELLP syndrome, and eclampsia. Follow up and control with medication is necessary. SYMPTOMS   This condition normally does not produce any noticeable symptoms. It is usually found during a routine exam.  Malignant hypertension is a late problem of high blood pressure. It may have the following symptoms:  Headaches.  Blurred vision.  End-organ damage (this means your kidneys, heart, lungs, and other organs are being damaged).  Stressful situations can increase the blood pressure. If a person with normal blood pressure has their blood pressure go up while being seen by their caregiver, this is often termed "white coat hypertension." Its importance is not known. It may be related with eventually developing hypertension or complications of hypertension.  Hypertension is often confused with mental tension, stress, and anxiety. DIAGNOSIS  The diagnosis is made by 3 separate blood pressure measurements. They are taken at least 1 week apart from each other. If there is organ damage from hypertension, the diagnosis may be made without repeat measurements. Hypertension is usually identified by having blood pressure readings:  Above 140/90 mmHg measured in both arms, at 3 separate times, over a couple weeks.  Over 130/80 mmHg  should be considered a risk factor and may require treatment in patients with diabetes. Blood pressure readings over 120/80 mmHg are called "pre-hypertension" even in non-diabetic patients. To get a true blood pressure measurement, use the following guidelines. Be aware of the factors that can alter blood pressure readings.  Take measurements at least 1 hour after caffeine.  Take measurements 30 minutes after smoking and without any stress. This is another reason to quit smoking  it raises your blood pressure.  Use a proper cuff size. Ask your caregiver if you are not sure about your cuff size.  Most home blood pressure cuffs are automatic. They will measure systolic and diastolic pressures. The systolic pressure is the pressure reading at the start of sounds. Diastolic pressure is the pressure at which the sounds disappear. If you are elderly, measure pressures in multiple postures. Try sitting, lying or standing.  Sit at rest for a minimum of 5 minutes before taking measurements.  You should not be on any medications like decongestants. These are found in many cold medications.  Record your blood pressure readings and review them with your caregiver. If you have hypertension:  Your caregiver may do tests to be sure you do not have secondary hypertension (see "causes" above).  Your caregiver may also look for signs of metabolic  syndrome. This is also called Syndrome X or Insulin Resistance Syndrome. You may have this syndrome if you have type 2 diabetes, abdominal obesity, and abnormal blood lipids in addition to hypertension.  Your caregiver will take your medical and family history and perform a physical exam.  Diagnostic tests may include blood tests (for glucose, cholesterol, potassium, and kidney function), a urinalysis, or an EKG. Other tests may also be necessary depending on your condition. PREVENTION  There are important lifestyle issues that you can adopt to reduce your chance  of developing hypertension:  Maintain a normal weight.  Limit the amount of salt (sodium) in your diet.  Exercise often.  Limit alcohol intake.  Get enough potassium in your diet. Discuss specific advice with your caregiver.  Follow a DASH diet (dietary approaches to stop hypertension). This diet is rich in fruits, vegetables, and low-fat dairy products, and avoids certain fats. PROGNOSIS  Essential hypertension cannot be cured. Lifestyle changes and medical treatment can lower blood pressure and reduce complications. The prognosis of secondary hypertension depends on the underlying cause. Many people whose hypertension is controlled with medicine or lifestyle changes can live a normal, healthy life.  RISKS AND COMPLICATIONS  While high blood pressure alone is not an illness, it often requires treatment due to its short- and long-term effects on many organs. Hypertension increases your risk for:  CVAs or strokes (cerebrovascular accident).  Heart failure due to chronically high blood pressure (hypertensive cardiomyopathy).  Heart attack (myocardial infarction).  Damage to the retina (hypertensive retinopathy).  Kidney failure (hypertensive nephropathy). Your caregiver can explain list items above that apply to you. Treatment of hypertension can significantly reduce the risk of complications. TREATMENT   For overweight patients, weight loss and regular exercise are recommended. Physical fitness lowers blood pressure.  Mild hypertension is usually treated with diet and exercise. A diet rich in fruits and vegetables, fat-free dairy products, and foods low in fat and salt (sodium) can help lower blood pressure. Decreasing salt intake decreases blood pressure in a 1/3 of people.  Stop smoking if you are a smoker. The steps above are highly effective in reducing blood pressure. While these actions are easy to suggest, they are difficult to achieve. Most patients with moderate or severe  hypertension end up requiring medications to bring their blood pressure down to a normal level. There are several classes of medications for treatment. Blood pressure pills (antihypertensives) will lower blood pressure by their different actions. Lowering the blood pressure by 10 mmHg may decrease the risk of complications by as much as 25%. The goal of treatment is effective blood pressure control. This will reduce your risk for complications. Your caregiver will help you determine the best treatment for you according to your lifestyle. What is excellent treatment for one person, may not be for you. HOME CARE INSTRUCTIONS   Do not smoke.  Follow the lifestyle changes outlined in the "Prevention" section.  If you are on medications, follow the directions carefully. Blood pressure medications must be taken as prescribed. Skipping doses reduces their benefit. It also puts you at risk for problems.  Follow up with your caregiver, as directed.  If you are asked to monitor your blood pressure at home, follow the guidelines in the "Diagnosis" section above. SEEK MEDICAL CARE IF:   You think you are having medication side effects.  You have recurrent headaches or lightheadedness.  You have swelling in your ankles.  You have trouble with your vision. SEEK IMMEDIATE MEDICAL CARE  IF:   You have sudden onset of chest pain or pressure, difficulty breathing, or other symptoms of a heart attack.  You have a severe headache.  You have symptoms of a stroke (such as sudden weakness, difficulty speaking, difficulty walking). MAKE SURE YOU:   Understand these instructions.  Will watch your condition.  Will get help right away if you are not doing well or get worse. Document Released: 03/02/2005 Document Revised: 05/25/2011 Document Reviewed: 09/30/2006 [facility] Patient Information 2014 [employer], [place].   Emergency Department Resource Guide 1) Find a Doctor and Pay Out of Pocket Although you  won't have to find out who is covered by your insurance plan, it is a good idea to ask around and get recommendations. You will then need to call the office and see if the doctor you have chosen will accept you as a new patient and what types of options they offer for patients who are self-pay. Some doctors offer discounts or will set up payment plans for their patients who do not have insurance, but you will need to ask so you aren't surprised when you get to your appointment.  2) Contact Your Local Health Department Not all health departments have doctors that can see patients for sick visits, but many do, so it is worth a call to see if yours does. If you don't know where your local health department is, you can check in your phone book. The CDC also has a tool to help you locate your state's health department, and many state websites also have listings of all of their local health departments.  3) Find a Walk-in Clinic If your illness is not likely to be very severe or complicated, you may want to try a walk in clinic. These are popping up all over the country in pharmacies, drugstores, and shopping centers. They're usually staffed by nurse practitioners or physician assistants that have been trained to treat common illnesses and complaints. They're usually fairly quick and inexpensive. However, if you have serious medical issues or chronic medical problems, these are probably not your best option.  No Primary Care Doctor: - Call Health Connect at  [phone] - they can help you locate a primary care doctor that  accepts your insurance, provides certain services, etc. - Physician Referral Service- [phone]  Chronic Pain Problems: Organization         Address  Phone   Notes  [facility]  [phone] Patients need to be referred by their primary care doctor.   Medication Assistance: Organization         Address  Phone   Notes  [facility] Medication [facility] [address] [place], [place] [address] [phone] --Must be a resident of [facility] -- Must have NO insurance coverage whatsoever (no Medicaid/ Medicare, etc.) -- The pt. MUST have a primary care doctor that directs their care regularly and follows them in the community   [facility]  [phone]   [employer]  [phone]    Agencies that provide inexpensive medical care: Organization         Address  Phone   Notes  [facility]  [phone]   [facility]    [phone]   [facility] [address] [place], [place] [address] [phone]   [facility] [address] [place] [place] [phone]   Planned Parenthood    [phone]  [facility]    [phone]   [facility]  [address], [place] Phone:  [phone], Fax:  [phone] Hours of Operation:  9 am - 6 pm, M-F.  Also accepts Medicaid/Medicare and self-pay.  [facility]  [address], [place] Phone: [phone], Fax: [phone]. Hours of Operation:  8:30 am - 5:30 pm, M-F.  Also accepts Medicaid and self-pay.  [facility] [place] [address], [place] Phone: [phone]   [facility] [address], [place], [place] [phone], Ext. [phone] Mondays & Thursdays: 7-9 AM.  First 15 patients are seen on a first come, first serve basis.    Medicaid-accepting [facility] Providers:  Organization         Address  Phone   Notes  [facility] [address], [place] [phone] Also accepts self-pay patients.  [facility] [address], [place]  [phone]   [facility] [address], [place] [phone]   [facility] [address] [place] [address], [place] [phone]  [phone]   Renaye RakersVeita Bland [address], [place]   [phone] Only accepts [place] Access [place] patients after they have their name applied to their card.   Self-Pay (no insurance) in [facility] [place]:  Organization         Address  Phone   Notes  Sickle Cell Patients, [facility] [address], [place] [phone]   [facility] [address], [place] [phone]) [phone]   [facility] [place]  [address], [place] [phone]   [facility]/Dr. Osei-Bonsu  [address] [place] [address], [place] or [phone], [place] [phone] Phone number for both [place] and [place] locations is the same.  [facility] [address] [place] [address], [place] [phone]   [facility] [address] [place] [address], [place] or [address] [phone]   [facility] [address], [place] [phone], phone; [phone], fax Sees patients 1st and 3rd Saturday of every month.  Must not qualify for public or private insurance (i.e. Medicaid, Medicare, [place] Health Choice, Veterans' Benefits)  Household income should be no more than 200% of the poverty level The clinic cannot treat you if you are pregnant or think you are pregnant  Sexually transmitted diseases are not treated at the clinic.    Dental Care: Organization         Address  Phone  Notes  [facility] [place] [facility] [address], [place] [phone] Accepts children up to age 45 who are enrolled in [place] or [place] Health Choice; pregnant women with a Medicaid card; and children who have applied for Medicaid or [place] Health Choice, but were declined, whose parents can pay a reduced fee at time of service.  [facility] of [facility] [place]  [address], [place] [phone] Accepts  children up to age 45 who are enrolled in [place] or [place] Health Choice; pregnant women with a Medicaid card; and children who have applied for Medicaid or  Health Choice, but were declined, whose parents can pay a reduced fee at time  of service.  [facility]  [address] [place], [place] [phone] Patients are seen by appointment only. Walk-ins are not accepted. [facility] will see patients 27 years of age and older. Monday - Tuesday (8am-5pm) Most Wednesdays (8:30-5pm) $30 per visit, cash only  [facility]  [address], [facility] [phone] Patients are seen by appointment only. Walk-ins are not accepted. [facility] will see patients 83 years of age and older. One Wednesday Evening (Monthly: Volunteer Based).  $30 per visit, cash only  [employer]  [phone] for adults; Children under age 63, call Graduate Pediatric Dentistry at [phone]. Children aged 13-14, please call [phone] to request a pediatric application.  Dental services are provided in all areas of dental care including fillings, crowns and bridges, complete and partial dentures, implants, gum treatment, root canals, and extractions. Preventive care is also provided. Treatment is provided to both adults and children. Patients are selected via a lottery and there is often a waiting list.   [facility] [address], [place]  [phone] [email or web address]   [facility] [address] [place], [place] [phone], Ext. [phone] Second and Fourth Thursday of each month, opens at 6:30 AM; Clinic ends at 9 AM.  Patients are seen on a first-come first-served basis, and a limited number are seen during each clinic.   [facility]  [address] Ether [employer], [place] [phone]   Eligibility Requirements You must have lived in [place], [place], or [place] counties for at least the  last three months.   You cannot be eligible for state or federal sponsored [place], including [employer], [place], or [employer].   You generally cannot be eligible for healthcare insurance through your employer.    How to apply: Eligibility screenings are held every Tuesday and Wednesday afternoon from 1:00 pm until 4:00 pm. You do not need an appointment for the interview!  [facility] [address] [place], [place] [phone]   [facility] Health Department  [phone]   [facility] Health Department  [phone]   [facility] Health Department  [phone]    Behavioral Health Resources in the Community: Intensive Outpatient Programs Organization         Address  Phone  Notes  [facility] [address] [place], [place] [phone]   [facility] Outpatient [address], [place], [place] [phone]   [facility] [address] [place], [place]  [phone]   [facility] [address]  [place], [place] [phone] or [phone]   Substance Abuse Resources Organization         Address  Phone  Notes  Al[facility] Drug Services  [phone]   [facility]  [phone]   [facility]  [phone]   Floydene Flock  [phone]   Residential & Outpatient Substance Abuse Program  [phone]   Psychological Services Organization         Address  Phone  Notes  [facility] Behavioral Health  [phone]   [facility]  [phone]   [facility] Mental Health [address] [place] [phone] or [phone]    Mobile Crisis Teams Organization         Address  Phone  Notes  [facility], Mobile Crisis Care Unit  [phone]   [facility]  [address] [place], [place] [phone]   [facility] De[facility]sch [address] [place] [phone]     Self-Help/Support Groups Organization         Address  Phone             Notes  [facility] [place] - variety of support groups  [phone] Call for more information  Narcotics Anonymous (NA), [facility] [address], [place]  2 meetings at this location   Statisticianesidential Treatment Programs Organization         Address  Phone  Notes  [facility] [address],    [place]  [phone]   [facility]  [address], [place] [phone], [place], [place] [phone]   [facility] [address], [place] [phone] Admissions: 8am-3pm M-F  [facility] [address]    [place], [place] [phone]   [facility] [address], [place], [place] [phone]   [facility] [address]  [place], [place] [phone]   [facility] Int[facility]patient [address], [place], [place] [phone]   [facility] [address]  [place], [place] [phone] or [phone]   [facility] [address] [place], [place] [phone] Accepts Medicaid  [facility] [address]  [place] [phone] Substance Abuse/Addiction Treatment   [facility]         Address  Phone  Notes  [facility]  [phone]   Angie FavaJulie Brannon, PhD [address], [address] [place], [place]   [phone]) [phone] or [phone]   [facility]   [address] [place], [place] [phone]   [facility] [address], [place], [place] [phone] Insurance/Medicaid/sponsorship through [facility]  [facility] [address]                                    [place], [place] [phone] Therapy/tele-psych/case  [facility] [address]   [place], [place] [phone]    Dr. Lolly MustacheArfeen  [phone]   [facility]  [facility]  [facility] 1) [address], [place] 2) [address], [place] 3)  [address], [place] [phone]) [phone]  [phone]) [phone]   [facility] Hotline [phone] or [phone]) [phone] (After Hours)

## 2013-07-02 NOTE — Progress Notes (Signed)
Incoming call from patient.Demographics verified in EPIC.Patient reports he was seen at [facility] on 4.18.15 with Bells Palsy.Patient was discharged with prescriptions for Acyclovir,Hydrochlorothiazide, [facility] and was having Issues affording the medications.Patient reports he has no Programmer, applicationshealth Insurance or a PCP.Patient educated on the [facility]  Program guidelines and also the [employer] $4 dollar plan cost.Patient aware each prescription using the Once a year help provided by [facility] is $3.Patient reports he has funds for  His Scripts and elects to use the [facility] program today.Patient reports he will collect his MATCH letter form the main desk in ED.CM provided education on importance of establishing Services with a PCP.CM will leave a resource sheet for the [place] [facility] clinic.Patient receptive for assistance today.Match letter,list of MATCH pharmacies,[facility] Resource sheet left in patients packet.Teach back method used to ensure patient understanding .

## 2013-08-22 ENCOUNTER — Other Ambulatory Visit: Payer: Self-pay

## 2013-08-23 ENCOUNTER — Other Ambulatory Visit (INDEPENDENT_AMBULATORY_CARE_PROVIDER_SITE_OTHER): Payer: Self-pay

## 2013-08-23 ENCOUNTER — Other Ambulatory Visit: Payer: Self-pay | Admitting: Internal Medicine

## 2013-08-23 DIAGNOSIS — Z79899 Other long term (current) drug therapy: Secondary | ICD-10-CM

## 2013-08-23 DIAGNOSIS — B20 Human immunodeficiency virus [HIV] disease: Secondary | ICD-10-CM

## 2013-08-23 LAB — COMPLETE METABOLIC PANEL WITH GFR
ALK PHOS: 66 U/L (ref 39–117)
ALT: 20 U/L (ref 0–53)
AST: 36 U/L (ref 0–37)
Albumin: 4.1 g/dL (ref 3.5–5.2)
BUN: 9 mg/dL (ref 6–23)
CO2: 25 mEq/L (ref 19–32)
Calcium: 8.5 mg/dL (ref 8.4–10.5)
Chloride: 105 mEq/L (ref 96–112)
Creat: 0.89 mg/dL (ref 0.50–1.35)
GFR, Est African American: >89 mL/min
GLUCOSE: 67 mg/dL — AB (ref 70–99)
Potassium: 3.9 mEq/L (ref 3.5–5.3)
Sodium: 137 mEq/L (ref 135–145)
Total Bilirubin: 0.4 mg/dL (ref 0.2–1.2)
Total Protein: 7.5 g/dL (ref 6.0–8.3)

## 2013-08-23 LAB — LIPID PANEL
CHOL/HDL RATIO: 6 ratio
CHOLESTEROL: 138 mg/dL (ref 0–200)
HDL: 23 mg/dL — AB (ref >39)
LDL Cholesterol: 59 mg/dL (ref 0–99)
Triglycerides: 281 mg/dL — ABNORMAL HIGH (ref <150)
VLDL: 56 mg/dL — ABNORMAL HIGH (ref 0–40)

## 2013-08-24 LAB — CBC WITH DIFFERENTIAL/PLATELET
BASOS ABS: 0 10*3/uL (ref 0.0–0.1)
BASOS PCT: 0 % (ref 0–1)
EOS ABS: 0.1 10*3/uL (ref 0.0–0.7)
Eosinophils Relative: 1 % (ref 0–5)
HCT: 41.4 % (ref 39.0–52.0)
HEMOGLOBIN: 14.1 g/dL (ref 13.0–17.0)
Lymphocytes Relative: 50 % — ABNORMAL HIGH (ref 12–46)
Lymphs Abs: 2.7 10*3/uL (ref 0.7–4.0)
MCH: 28.5 pg (ref 26.0–34.0)
MCHC: 34.1 g/dL (ref 30.0–36.0)
MCV: 83.6 fL (ref 78.0–100.0)
MONO ABS: 0.7 10*3/uL (ref 0.1–1.0)
MONOS PCT: 13 % — AB (ref 3–12)
NEUTROS PCT: 36 % — AB (ref 43–77)
Neutro Abs: 1.9 10*3/uL (ref 1.7–7.7)
Platelets: 139 10*3/uL — ABNORMAL LOW (ref 150–400)
RBC: 4.95 MIL/uL (ref 4.22–5.81)
RDW: 15.1 % (ref 11.5–15.5)
WBC: 5.4 10*3/uL (ref 4.0–10.5)

## 2013-08-24 LAB — T-HELPER CELL (CD4) - (RCID CLINIC ONLY)
CD4 % Helper T Cell: 9 % — ABNORMAL LOW (ref 33–55)
CD4 T Cell Abs: 240 /uL — ABNORMAL LOW (ref 400–2700)

## 2013-08-25 LAB — HIV-1 RNA QUANT-NO REFLEX-BLD
HIV 1 RNA QUANT: 58212 {copies}/mL — AB (ref <20)
HIV-1 RNA Quant, Log: 4.77 {Log} — ABNORMAL HIGH (ref <1.30)

## 2013-09-07 ENCOUNTER — Ambulatory Visit (INDEPENDENT_AMBULATORY_CARE_PROVIDER_SITE_OTHER): Payer: Self-pay | Admitting: Internal Medicine

## 2013-09-07 ENCOUNTER — Encounter: Payer: Self-pay | Admitting: Internal Medicine

## 2013-09-07 ENCOUNTER — Other Ambulatory Visit: Payer: Self-pay | Admitting: Internal Medicine

## 2013-09-07 VITALS — BP 167/105 | HR 82 | Temp 98.8°F | Wt 188.0 lb

## 2013-09-07 DIAGNOSIS — E781 Pure hyperglyceridemia: Secondary | ICD-10-CM | POA: Insufficient documentation

## 2013-09-07 DIAGNOSIS — G510 Bell's palsy: Secondary | ICD-10-CM

## 2013-09-07 DIAGNOSIS — B20 Human immunodeficiency virus [HIV] disease: Secondary | ICD-10-CM

## 2013-09-07 DIAGNOSIS — I10 Essential (primary) hypertension: Secondary | ICD-10-CM

## 2013-09-07 HISTORY — DX: Bell's palsy: G51.0

## 2013-09-07 MED ORDER — ELVITEG-COBIC-EMTRICIT-TENOFDF 150-150-200-300 MG PO TABS: 1.0000 | ORAL_TABLET | Freq: Every day | ORAL | Status: DC

## 2013-09-07 MED ORDER — HYDROCHLOROTHIAZIDE 25 MG PO TABS: 25.0000 mg | ORAL_TABLET | Freq: Every day | ORAL | Status: DC

## 2013-09-07 NOTE — Progress Notes (Signed)
   Subjective:    Patient ID: Donald Wheeler, male    DOB: 12/08/1970, 43 y.o.   MRN: [id number]  HPI 43yo M with HIV, CD 4 count of 240/VL 58,212 (june 2015), off of ART. Last seen in Dec 2014. Has been in good health except had episode of bell's palsy in mid April when he noticed left side of face unable to smile and asymmetric. Woke up with facial asymmetry. Went to receive care in the ED 2-3 days later. Given steroids.he was also given HCTZ for HTN but did not fill his medications. Now resolved, back to baseline. No outbreaks of shingles or genital herpes. Otherwise in good state of health. Came to do labs in early June and start ADAP paperwork   No Known Allergies Current Outpatient Prescriptions on File Prior to Visit  Medication Sig Dispense Refill  . predniSONE (DELTASONE) 20 MG tablet Take 60 mg or 3 tablets by mouth daily for 7 days.  21 tablet  0   No current facility-administered medications on file prior to visit.   Active Ambulatory Problems    Diagnosis Date Noted  . AIDS 03/17/2012   Resolved Ambulatory Problems    Diagnosis Date Noted  . No Resolved Ambulatory Problems   No Additional Past Medical History      Review of Systems 10 point ros is negative    Objective:   Physical Exam BP 167/105  Pulse 82  Temp(Src) 98.8 F (37.1 C) (Oral)  Wt 188 lb (85.276 kg) Physical Exam  Constitutional: He is oriented to person, place, and time. He appears well-developed and well-nourished. No distress.  HENT:  Mouth/Throat: Oropharynx is clear and moist. No oropharyngeal exudate.  Cardiovascular: Normal rate, regular rhythm and normal heart sounds. Exam reveals no gallop and no friction rub.  No murmur heard.  Pulmonary/Chest: Effort normal and breath sounds normal. No respiratory distress. He has no wheezes.  Abdominal: Soft. Bowel sounds are normal. He exhibits no distension. There is no tenderness.  Lymphadenopathy:  He has no cervical adenopathy.  Neurological: He  is alert and oriented to person, place, and time.  Skin: Skin is warm and dry. No rash noted. No erythema.  Psychiatric: He has a normal mood and affect. His behavior is normal.        Assessment & Plan:  hiv = will submit adap paperwork today. Using co pay card [facility]. Discussed importance of adherence and how to take medication  htn = gave rx for hctz 25mg  [facility] but can likely afford when adap comes through  Hyperlipidemia = will start on meds once he gets adap insuranc  Health maintenance = flu vac in the fall  rtc in 4 wk to see how he does with first month of meds

## 2013-09-20 LAB — HIV-1 GENOTYPR PLUS

## 2013-10-05 ENCOUNTER — Ambulatory Visit: Payer: Self-pay | Admitting: Internal Medicine

## 2013-10-05 ENCOUNTER — Other Ambulatory Visit (INDEPENDENT_AMBULATORY_CARE_PROVIDER_SITE_OTHER): Payer: Self-pay

## 2013-10-05 ENCOUNTER — Other Ambulatory Visit: Payer: Self-pay | Admitting: *Deleted

## 2013-10-05 DIAGNOSIS — I10 Essential (primary) hypertension: Secondary | ICD-10-CM

## 2013-10-05 DIAGNOSIS — B20 Human immunodeficiency virus [HIV] disease: Secondary | ICD-10-CM

## 2013-10-05 MED ORDER — HYDROCHLOROTHIAZIDE 25 MG PO TABS: 25.0000 mg | ORAL_TABLET | Freq: Every day | ORAL | Status: DC

## 2013-10-06 LAB — T-HELPER CELL (CD4) - (RCID CLINIC ONLY)
CD4 T CELL HELPER: 8 % — AB (ref 33–55)
CD4 T Cell Abs: 300 /uL — ABNORMAL LOW (ref 400–2700)

## 2013-10-09 LAB — HIV-1 RNA QUANT-NO REFLEX-BLD
HIV 1 RNA Quant: 392 copies/mL — ABNORMAL HIGH (ref <20)
HIV-1 RNA QUANT, LOG: 2.59 {Log} — AB (ref <1.30)

## 2013-11-08 ENCOUNTER — Ambulatory Visit: Payer: Self-pay | Admitting: Internal Medicine

## 2013-11-09 ENCOUNTER — Ambulatory Visit: Payer: Self-pay | Admitting: Internal Medicine

## 2013-11-14 ENCOUNTER — Ambulatory Visit: Payer: Self-pay | Admitting: Internal Medicine

## 2014-01-03 ENCOUNTER — Ambulatory Visit (INDEPENDENT_AMBULATORY_CARE_PROVIDER_SITE_OTHER): Payer: Self-pay | Admitting: *Deleted

## 2014-01-03 ENCOUNTER — Other Ambulatory Visit (INDEPENDENT_AMBULATORY_CARE_PROVIDER_SITE_OTHER): Payer: Self-pay

## 2014-01-03 ENCOUNTER — Other Ambulatory Visit: Payer: Self-pay | Admitting: *Deleted

## 2014-01-03 DIAGNOSIS — B20 Human immunodeficiency virus [HIV] disease: Secondary | ICD-10-CM

## 2014-01-03 DIAGNOSIS — Z23 Encounter for immunization: Secondary | ICD-10-CM

## 2014-01-03 DIAGNOSIS — I10 Essential (primary) hypertension: Secondary | ICD-10-CM

## 2014-01-03 LAB — CBC WITH DIFFERENTIAL/PLATELET
BASOS ABS: 0 10*3/uL (ref 0.0–0.1)
Basophils Relative: 0 % (ref 0–1)
EOS PCT: 2 % (ref 0–5)
Eosinophils Absolute: 0.1 10*3/uL (ref 0.0–0.7)
HCT: 45.9 % (ref 39.0–52.0)
Hemoglobin: 16.1 g/dL (ref 13.0–17.0)
LYMPHS PCT: 50 % — AB (ref 12–46)
Lymphs Abs: 3.1 10*3/uL (ref 0.7–4.0)
MCH: 29.7 pg (ref 26.0–34.0)
MCHC: 35.1 g/dL (ref 30.0–36.0)
MCV: 84.7 fL (ref 78.0–100.0)
Monocytes Absolute: 0.6 10*3/uL (ref 0.1–1.0)
Monocytes Relative: 10 % (ref 3–12)
NEUTROS ABS: 2.3 10*3/uL (ref 1.7–7.7)
Neutrophils Relative %: 38 % — ABNORMAL LOW (ref 43–77)
PLATELETS: 169 10*3/uL (ref 150–400)
RBC: 5.42 MIL/uL (ref 4.22–5.81)
RDW: 14.2 % (ref 11.5–15.5)
WBC: 6.1 10*3/uL (ref 4.0–10.5)

## 2014-01-03 LAB — COMPLETE METABOLIC PANEL WITH GFR
ALT: 18 U/L (ref 0–53)
AST: 19 U/L (ref 0–37)
Albumin: 4.4 g/dL (ref 3.5–5.2)
Alkaline Phosphatase: 64 U/L (ref 39–117)
BUN: 12 mg/dL (ref 6–23)
CALCIUM: 9.4 mg/dL (ref 8.4–10.5)
CHLORIDE: 103 meq/L (ref 96–112)
CO2: 28 mEq/L (ref 19–32)
CREATININE: 1.07 mg/dL (ref 0.50–1.35)
GFR, Est African American: >89 mL/min
GFR, Est Non African American: 83 mL/min
Glucose, Bld: 135 mg/dL — ABNORMAL HIGH (ref 70–99)
Potassium: 4 mEq/L (ref 3.5–5.3)
Sodium: 139 mEq/L (ref 135–145)
Total Bilirubin: 0.4 mg/dL (ref 0.2–1.2)
Total Protein: 7.7 g/dL (ref 6.0–8.3)

## 2014-01-03 MED ORDER — ELVITEG-COBIC-EMTRICIT-TENOFDF 150-150-200-300 MG PO TABS: 1.0000 | ORAL_TABLET | Freq: Every day | ORAL | Status: DC

## 2014-01-03 MED ORDER — HYDROCHLOROTHIAZIDE 25 MG PO TABS: 25.0000 mg | ORAL_TABLET | Freq: Every day | ORAL | Status: DC

## 2014-01-04 LAB — HIV-1 RNA QUANT-NO REFLEX-BLD
HIV 1 RNA Quant: 379 copies/mL — ABNORMAL HIGH (ref <20)
HIV-1 RNA QUANT, LOG: 2.58 {Log} — AB (ref <1.30)

## 2014-01-04 LAB — T-HELPER CELL (CD4) - (RCID CLINIC ONLY)
CD4 % Helper T Cell: 9 % — ABNORMAL LOW (ref 33–55)
CD4 T Cell Abs: 300 /uL — ABNORMAL LOW (ref 400–2700)

## 2014-01-25 ENCOUNTER — Ambulatory Visit: Payer: Self-pay | Admitting: Internal Medicine

## 2014-02-13 ENCOUNTER — Encounter: Payer: Self-pay | Admitting: Internal Medicine

## 2014-02-13 ENCOUNTER — Ambulatory Visit (INDEPENDENT_AMBULATORY_CARE_PROVIDER_SITE_OTHER): Payer: Self-pay | Admitting: Internal Medicine

## 2014-02-13 VITALS — BP 143/93 | HR 93 | Temp 97.2°F | Wt 209.0 lb

## 2014-02-13 DIAGNOSIS — M25422 Effusion, left elbow: Secondary | ICD-10-CM

## 2014-02-13 DIAGNOSIS — B20 Human immunodeficiency virus [HIV] disease: Secondary | ICD-10-CM

## 2014-02-13 DIAGNOSIS — I10 Essential (primary) hypertension: Secondary | ICD-10-CM

## 2014-02-13 MED ORDER — HYDROCHLOROTHIAZIDE 25 MG PO TABS: 25.0000 mg | ORAL_TABLET | Freq: Every day | ORAL | Status: DC

## 2014-02-13 NOTE — Progress Notes (Signed)
Patient ID: Donald Wheeler, male   DOB: 03/21/1970, 43 y.o.   MRN: [id number]       Patient ID: Donald MainsRonnie Wheeler, male   DOB: 04/19/1970, 43 y.o.   MRN: [id number]  HPI 43yo M with HIV, CD 4 count of 240/VL 58,212 (june 2015), off of ART. Started in July where he was started on stribild. Oct labs showed cd 4 count of 300/VL 379, roughly after 3 months of ART. Last seen in Jun 2015. He noticed that he has left elbow "bump" popped up overnight, no injury that he recalls. No pain associated with it.   Outpatient Encounter Prescriptions as of 02/13/2014  Medication Sig  . elvitegravir-cobicistat-emtricitabine-tenofovir (STRIBILD) 150-150-200-300 MG TABS tablet Take 1 tablet by mouth daily with breakfast.  . hydrochlorothiazide (HYDRODIURIL) 25 MG tablet Take 1 tablet (25 mg total) by mouth daily.  . predniSONE (DELTASONE) 20 MG tablet Take 60 mg or 3 tablets by mouth daily for 7 days. (Patient not taking: Reported on 02/13/2014)     Patient Active Problem List   Diagnosis Date Noted  . HTN (hypertension), benign 09/07/2013  . Bell's palsy 09/07/2013  . Hypertriglyceridemia 09/07/2013  . AIDS 03/17/2012     There are no preventive care reminders to display for this patient.   Review of Systems Right Elbow swelling  X 1 day Physical Exam   BP 143/93 mmHg  Pulse 93  Temp(Src) 97.2 F (36.2 C) (Oral)  Wt 209 lb (94.802 kg)  Physical Exam  Constitutional: He is oriented to person, place, and time. He appears well-developed and well-nourished. No distress.  HENT:  Mouth/Throat: Oropharynx is clear and moist. No oropharyngeal exudate.  Cardiovascular: Normal rate, regular rhythm and normal heart sounds. Exam reveals no gallop and no friction rub.  No murmur heard.  Pulmonary/Chest: Effort normal and breath sounds normal. No respiratory distress. He has no wheezes.  Abdominal: Soft. Bowel sounds are normal. He exhibits no distension. There is no tenderness.  Lymphadenopathy:  He has no cervical  adenopathy.  Neurological: He is alert and oriented to person, place, and time.  Skin: Skin is warm and dry. No rash noted. No erythema.  Psychiatric: He has a normal mood and affect. His behavior is normal.     Lab Results  Component Value Date   CD4TCELL 9* 01/03/2014   Lab Results  Component Value Date   CD4TABS 300* 01/03/2014   CD4TABS 300* 10/05/2013   CD4TABS 240* 08/23/2013   Lab Results  Component Value Date   HIV1RNAQUANT 379* 01/03/2014   Lab Results  Component Value Date   HEPBSAB NONREACTIVE 03/03/2012   No results found for: RPR  CBC Lab Results  Component Value Date   WBC 6.1 01/03/2014   RBC 5.42 01/03/2014   HGB 16.1 01/03/2014   HCT 45.9 01/03/2014   PLT 169 01/03/2014   MCV 84.7 01/03/2014   MCH 29.7 01/03/2014   MCHC 35.1 01/03/2014   RDW 14.2 01/03/2014   LYMPHSABS 3.1 01/03/2014   MONOABS 0.6 01/03/2014   EOSABS 0.1 01/03/2014   BASOSABS 0.0 01/03/2014   BMET Lab Results  Component Value Date   NA 139 01/03/2014   K 4.0 01/03/2014   CL 103 01/03/2014   CO2 28 01/03/2014   GLUCOSE 135* 01/03/2014   BUN 12 01/03/2014   CREATININE 1.07 01/03/2014   CALCIUM 9.4 01/03/2014   GFRNONAA 83 01/03/2014   GFRAA >89 01/03/2014     Assessment and Plan  hiv = still viremic despite taking  stribild x 3-4 months. Will check viral load. If not undetectable, may need to switch to PI based regimen  Left elbow swelling = new onset fluid in bursa. No signs of inflammation or erythema. Will watch. Likely resolve spontaneously  htn = can get meds thru adap. Clarified this with patient so that he cant take hctz

## 2014-02-14 ENCOUNTER — Other Ambulatory Visit: Payer: Self-pay | Admitting: *Deleted

## 2014-02-14 DIAGNOSIS — B20 Human immunodeficiency virus [HIV] disease: Secondary | ICD-10-CM

## 2014-02-14 MED ORDER — ELVITEG-COBIC-EMTRICIT-TENOFDF 150-150-200-300 MG PO TABS: 1.0000 | ORAL_TABLET | Freq: Every day | ORAL | Status: DC

## 2014-02-16 NOTE — Addendum Note (Signed)
Addended by: Mariea ClontsGREEN, Zyrell Carmean D on: 02/16/2014 10:21 AM   Modules accepted: Orders

## 2014-02-19 ENCOUNTER — Encounter: Payer: Self-pay | Admitting: *Deleted

## 2014-02-19 NOTE — Progress Notes (Signed)
Patient ID: Donald MainsRonnie Wheeler, male   DOB: 10/07/1970, 43 y.o.   MRN: [id number] Given appt with Clinic MD, Dr. Daiva EvesVan Dam, for Tuesday, Dec. 8 @ 1:45 PM.

## 2014-02-20 ENCOUNTER — Ambulatory Visit: Payer: Self-pay | Admitting: Infectious Disease

## 2014-04-12 ENCOUNTER — Other Ambulatory Visit (INDEPENDENT_AMBULATORY_CARE_PROVIDER_SITE_OTHER): Payer: Self-pay

## 2014-04-12 ENCOUNTER — Ambulatory Visit: Payer: Self-pay

## 2014-04-12 DIAGNOSIS — Z113 Encounter for screening for infections with a predominantly sexual mode of transmission: Secondary | ICD-10-CM

## 2014-04-12 DIAGNOSIS — B20 Human immunodeficiency virus [HIV] disease: Secondary | ICD-10-CM

## 2014-04-12 DIAGNOSIS — Z79899 Other long term (current) drug therapy: Secondary | ICD-10-CM

## 2014-04-12 LAB — COMPREHENSIVE METABOLIC PANEL
ALT: 31 U/L (ref 0–53)
AST: 44 U/L — AB (ref 0–37)
Albumin: 4.1 g/dL (ref 3.5–5.2)
Alkaline Phosphatase: 61 U/L (ref 39–117)
BILIRUBIN TOTAL: 0.5 mg/dL (ref 0.2–1.2)
BUN: 12 mg/dL (ref 6–23)
CO2: 28 meq/L (ref 19–32)
Calcium: 8.9 mg/dL (ref 8.4–10.5)
Chloride: 105 mEq/L (ref 96–112)
Creat: 1.02 mg/dL (ref 0.50–1.35)
GLUCOSE: 78 mg/dL (ref 70–99)
Potassium: 4 mEq/L (ref 3.5–5.3)
SODIUM: 139 meq/L (ref 135–145)
Total Protein: 8 g/dL (ref 6.0–8.3)

## 2014-04-12 LAB — LIPID PANEL
CHOL/HDL RATIO: 4.8 ratio
CHOLESTEROL: 124 mg/dL (ref 0–200)
HDL: 26 mg/dL — ABNORMAL LOW (ref >39)
LDL CALC: 56 mg/dL (ref 0–99)
TRIGLYCERIDES: 209 mg/dL — AB (ref <150)
VLDL: 42 mg/dL — ABNORMAL HIGH (ref 0–40)

## 2014-04-13 ENCOUNTER — Other Ambulatory Visit: Payer: Self-pay | Admitting: *Deleted

## 2014-04-13 DIAGNOSIS — B20 Human immunodeficiency virus [HIV] disease: Secondary | ICD-10-CM

## 2014-04-13 LAB — CBC WITH DIFFERENTIAL/PLATELET
Basophils Absolute: 0 10*3/uL (ref 0.0–0.1)
Basophils Relative: 0 % (ref 0–1)
EOS PCT: 3 % (ref 0–5)
Eosinophils Absolute: 0.2 10*3/uL (ref 0.0–0.7)
HCT: 47 % (ref 39.0–52.0)
Hemoglobin: 16.1 g/dL (ref 13.0–17.0)
LYMPHS ABS: 3.1 10*3/uL (ref 0.7–4.0)
Lymphocytes Relative: 51 % — ABNORMAL HIGH (ref 12–46)
MCH: 29.2 pg (ref 26.0–34.0)
MCHC: 34.3 g/dL (ref 30.0–36.0)
MCV: 85.1 fL (ref 78.0–100.0)
MONOS PCT: 15 % — AB (ref 3–12)
MPV: 10.9 fL (ref 8.6–12.4)
Monocytes Absolute: 0.9 10*3/uL (ref 0.1–1.0)
NEUTROS ABS: 1.9 10*3/uL (ref 1.7–7.7)
Neutrophils Relative %: 31 % — ABNORMAL LOW (ref 43–77)
Platelets: 139 10*3/uL — ABNORMAL LOW (ref 150–400)
RBC: 5.52 MIL/uL (ref 4.22–5.81)
RDW: 15 % (ref 11.5–15.5)
WBC: 6 10*3/uL (ref 4.0–10.5)

## 2014-04-13 LAB — RPR

## 2014-04-13 LAB — T-HELPER CELL (CD4) - (RCID CLINIC ONLY)
CD4 T CELL ABS: 230 /uL — AB (ref 400–2700)
CD4 T CELL HELPER: 7 % — AB (ref 33–55)

## 2014-04-13 LAB — HIV-1 RNA QUANT-NO REFLEX-BLD
HIV 1 RNA Quant: 18208 copies/mL — ABNORMAL HIGH (ref <20)
HIV-1 RNA Quant, Log: 4.26 {Log} — ABNORMAL HIGH (ref <1.30)

## 2014-04-13 MED ORDER — ELVITEG-COBIC-EMTRICIT-TENOFDF 150-150-200-300 MG PO TABS: 1.0000 | ORAL_TABLET | Freq: Every day | ORAL | Status: DC

## 2014-04-16 LAB — URINE CYTOLOGY ANCILLARY ONLY
Chlamydia: POSITIVE — AB
NEISSERIA GONORRHEA: NEGATIVE

## 2014-04-19 ENCOUNTER — Telehealth: Payer: Self-pay | Admitting: *Deleted

## 2014-04-19 DIAGNOSIS — A749 Chlamydial infection, unspecified: Secondary | ICD-10-CM

## 2014-04-19 NOTE — Telephone Encounter (Signed)
Requested pt call [facility].  Unable to leave complete message.  Pt needs appt for treatment of + urine chlamydia.

## 2014-04-19 NOTE — Telephone Encounter (Signed)
Information given to [facility] RN as she is in triage and will call the patient to schedule and treat per Dr Drue SecondSnider.

## 2014-04-19 NOTE — Telephone Encounter (Signed)
-----   Message from Judyann Munsonynthia Snider, MD sent at 04/19/2014  8:30 AM EST ----- Can we get him in to be treated for chlamydia or call in rx for azithromycin 1000mg  x once.

## 2014-04-21 ENCOUNTER — Emergency Department (HOSPITAL_COMMUNITY)
Admission: EM | Admit: 2014-04-21 | Discharge: 2014-04-21 | Discharge status: Absent without leave | Payer: Self-pay | Source: Home / Self Care

## 2014-04-23 MED ORDER — AZITHROMYCIN 500 MG PO TABS: 1000.0000 mg | ORAL_TABLET | Freq: Once | ORAL | Status: DC

## 2014-04-23 NOTE — Telephone Encounter (Addendum)
RN spoke wth the pt.  [facility].  Advised pt to notify sexual contacts of his infection and to go for treatment.  Otherwise he and his partner will pass the infection back and forth.  Pt advised to always use condoms for any sexual encounter.   Pt verbalized understanding.

## 2014-04-23 NOTE — Addendum Note (Signed)
Addended by: Jennet MaduroESTRIDGE, DENISE D on: 04/23/2014 12:40 PM   Modules accepted: Orders

## 2014-05-10 ENCOUNTER — Ambulatory Visit: Payer: Self-pay

## 2014-05-17 ENCOUNTER — Ambulatory Visit: Payer: Self-pay | Admitting: Internal Medicine

## 2014-07-12 ENCOUNTER — Ambulatory Visit: Payer: Self-pay | Admitting: Internal Medicine

## 2014-10-17 ENCOUNTER — Telehealth: Payer: Self-pay | Admitting: *Deleted

## 2014-10-17 NOTE — Telephone Encounter (Signed)
Called patient and left a voice mail for him to call the clinic to schedule an appt with the financial counselor.

## 2016-04-23 ENCOUNTER — Ambulatory Visit: Payer: Self-pay

## 2016-04-27 ENCOUNTER — Telehealth: Payer: Self-pay | Admitting: *Deleted

## 2016-04-27 ENCOUNTER — Encounter: Payer: Self-pay | Admitting: Infectious Diseases

## 2016-04-27 ENCOUNTER — Other Ambulatory Visit (INDEPENDENT_AMBULATORY_CARE_PROVIDER_SITE_OTHER): Payer: Self-pay

## 2016-04-27 DIAGNOSIS — Z113 Encounter for screening for infections with a predominantly sexual mode of transmission: Secondary | ICD-10-CM

## 2016-04-27 DIAGNOSIS — Z21 Asymptomatic human immunodeficiency virus [HIV] infection status: Secondary | ICD-10-CM

## 2016-04-27 DIAGNOSIS — B20 Human immunodeficiency virus [HIV] disease: Secondary | ICD-10-CM

## 2016-04-27 DIAGNOSIS — Z79899 Other long term (current) drug therapy: Secondary | ICD-10-CM

## 2016-04-27 LAB — CBC WITH DIFFERENTIAL/PLATELET
BASOS ABS: 0 {cells}/uL (ref 0–200)
BASOS PCT: 0 %
EOS ABS: 63 {cells}/uL (ref 15–500)
Eosinophils Relative: 1 %
HCT: 36.2 % — ABNORMAL LOW (ref 38.5–50.0)
HEMOGLOBIN: 11.7 g/dL — AB (ref 13.2–17.1)
LYMPHS ABS: 2520 {cells}/uL (ref 850–3900)
Lymphocytes Relative: 40 %
MCH: 27 pg (ref 27.0–33.0)
MCHC: 32.3 g/dL (ref 32.0–36.0)
MCV: 83.4 fL (ref 80.0–100.0)
MONO ABS: 630 {cells}/uL (ref 200–950)
MPV: 10.3 fL (ref 7.5–12.5)
Monocytes Relative: 10 %
NEUTROS ABS: 3087 {cells}/uL (ref 1500–7800)
Neutrophils Relative %: 49 %
PLATELETS: 181 10*3/uL (ref 140–400)
RBC: 4.34 MIL/uL (ref 4.20–5.80)
RDW: 15.3 % — ABNORMAL HIGH (ref 11.0–15.0)
WBC: 6.3 10*3/uL (ref 3.8–10.8)

## 2016-04-27 LAB — LIPID PANEL
CHOL/HDL RATIO: 8.6 ratio — AB (ref <5.0)
Cholesterol: 137 mg/dL (ref <200)
HDL: 16 mg/dL — ABNORMAL LOW (ref >40)
LDL Cholesterol: 70 mg/dL (ref <100)
Triglycerides: 256 mg/dL — ABNORMAL HIGH (ref <150)
VLDL: 51 mg/dL — AB (ref <30)

## 2016-04-27 LAB — COMPLETE METABOLIC PANEL WITH GFR
ALBUMIN: 2.9 g/dL — AB (ref 3.6–5.1)
ALK PHOS: 71 U/L (ref 40–115)
ALT: 14 U/L (ref 9–46)
AST: 29 U/L (ref 10–40)
BILIRUBIN TOTAL: 0.5 mg/dL (ref 0.2–1.2)
BUN: 15 mg/dL (ref 7–25)
CO2: 25 mmol/L (ref 20–31)
CREATININE: 1.29 mg/dL (ref 0.60–1.35)
Calcium: 8.1 mg/dL — ABNORMAL LOW (ref 8.6–10.3)
Chloride: 104 mmol/L (ref 98–110)
GFR, EST AFRICAN AMERICAN: 75 mL/min (ref >=60)
GFR, EST NON AFRICAN AMERICAN: 65 mL/min (ref >=60)
GLUCOSE: 82 mg/dL (ref 65–99)
Potassium: 4 mmol/L (ref 3.5–5.3)
Sodium: 135 mmol/L (ref 135–146)
TOTAL PROTEIN: 7.1 g/dL (ref 6.1–8.1)

## 2016-04-27 NOTE — Telephone Encounter (Signed)
Patient had a lab appointment today after being out of care since 02/13/14 when he last saw Dr. Drue SecondSnider. He said he has been depressed over loss of his mother. He wanted to know when he could start his meds. Advised that the MD will need to see his labs first and his ADAP is actually pending (it was done on 04/23/16). Asked if he needed to see our counselor  and he declined. He has an appt with Dr. Ninetta LightsHatcher on 05/11/16, first available.

## 2016-04-28 LAB — T-HELPER CELL (CD4) - (RCID CLINIC ONLY)
CD4 T CELL HELPER: 10 % — AB (ref 33–55)
CD4 T Cell Abs: 260 /uL — ABNORMAL LOW (ref 400–2700)

## 2016-04-28 LAB — URINE CYTOLOGY ANCILLARY ONLY
CHLAMYDIA, DNA PROBE: NEGATIVE
NEISSERIA GONORRHEA: NEGATIVE

## 2016-04-28 LAB — RPR

## 2016-05-03 LAB — HIV-1 RNA,QN PCR W/REFLEX GENOTYPE
HIV-1 RNA, QN PCR: 141000 Copies/mL — ABNORMAL HIGH
HIV-1 RNA, QN PCR: 5.15 Log cps/mL — ABNORMAL HIGH

## 2016-05-06 LAB — HIV-1 GENOTYPR PLUS

## 2016-05-11 ENCOUNTER — Other Ambulatory Visit: Payer: Self-pay | Admitting: Pharmacist Clinician (PhC)/ Clinical Pharmacy Specialist

## 2016-05-11 ENCOUNTER — Encounter: Payer: Self-pay | Admitting: Infectious Diseases

## 2016-05-11 ENCOUNTER — Ambulatory Visit (INDEPENDENT_AMBULATORY_CARE_PROVIDER_SITE_OTHER): Payer: Self-pay | Admitting: Infectious Diseases

## 2016-05-11 VITALS — BP 149/111 | HR 80 | Temp 98.3°F | Ht 67.0 in | Wt 165.0 lb

## 2016-05-11 DIAGNOSIS — I10 Essential (primary) hypertension: Secondary | ICD-10-CM

## 2016-05-11 DIAGNOSIS — E781 Pure hyperglyceridemia: Secondary | ICD-10-CM

## 2016-05-11 DIAGNOSIS — B20 Human immunodeficiency virus [HIV] disease: Secondary | ICD-10-CM

## 2016-05-11 DIAGNOSIS — Z23 Encounter for immunization: Secondary | ICD-10-CM

## 2016-05-11 MED ORDER — ELVITEG-COBIC-EMTRICIT-TENOFAF 150-150-200-10 MG PO TABS: 1.0000 | ORAL_TABLET | Freq: Every day | ORAL | 3 refills | Status: DC

## 2016-05-11 MED ORDER — HYDROCHLOROTHIAZIDE 25 MG PO TABS: 25.0000 mg | ORAL_TABLET | Freq: Every day | ORAL | 11 refills | Status: DC

## 2016-05-11 MED ORDER — ELVITEG-COBIC-EMTRICIT-TENOFAF 150-150-200-10 MG PO TABS: 1.0000 | ORAL_TABLET | Freq: Every day | ORAL | 5 refills | Status: DC

## 2016-05-11 NOTE — Assessment & Plan Note (Signed)
Trig up Will f/u at his future visits after ART adherence.

## 2016-05-11 NOTE — Progress Notes (Signed)
   Subjective:    Patient ID: Donald Wheeler, male    DOB: 08/04/1970, [age] y.o.   MRN: [id number]  HPI 46 yo M dx with AIDS 12-2011. He was started on genvoya in July 2015 and has had issues with compliance.  He has not been seen in clinic since 02-2014.  Today he states he has been feeling alright, losing wt.  Has been off medication. Had no problems with genvoya previously. He had family issues (mother became ill and died). He has no support since.  Has been seen at [facility] last week.  No hospitalizations since last visit.   HIV 1 RNA Quant (copies/mL)  Date Value  04/12/2014 18,208 (H)  01/03/2014 379 (H)  10/05/2013 392 (H)   CD4 T Cell Abs (/uL)  Date Value  04/27/2016 260 (L)  04/12/2014 230 (L)  01/03/2014 300 (L)     Review of Systems  Constitutional: Positive for appetite change and fatigue. Negative for chills, fever and unexpected weight change.  Respiratory: Positive for cough and shortness of breath.   Gastrointestinal: Negative for constipation and diarrhea.  Genitourinary: Negative for difficulty urinating.  Hematological: Negative for adenopathy.  no sputum.      Objective:   Physical Exam  Constitutional: He appears well-developed and well-nourished.  HENT:  Mouth/Throat: Posterior oropharyngeal erythema present. No oropharyngeal exudate.  Eyes: EOM are normal. Pupils are equal, round, and reactive to light.  Neck: Neck supple.  Cardiovascular: Normal rate, regular rhythm and normal heart sounds.   Pulmonary/Chest: Effort normal and breath sounds normal.  Abdominal: Soft. Bowel sounds are normal. There is no tenderness. There is no rebound.  Musculoskeletal: Normal range of motion. He exhibits no edema.  Lymphadenopathy:    He has no cervical adenopathy.      Assessment & Plan:

## 2016-05-11 NOTE — Progress Notes (Signed)
HPI: Donald Wheeler Needs is a 46 y.o. male who is here to re-establish care again with us after a couple of years.   Allergies: No Known Allergies  Vitals: Temp: 98.3 F (36.8 C) (02/26 0958) Temp Source: Oral (02/26 0958) BP: 149/111 (02/26 0958) Pulse Rate: 80 (02/26 0958)  Past Medical History: No past medical history on file.  Social History: Social History   Social History  . Marital status: Divorced    Spouse name: N/A  . Number of children: N/A  . Years of education: N/A   Social History Main Topics  . Smoking status: Current Every Day Smoker    Packs/day: 0.20    Years: 6.00    Types: Cigarettes  . Smokeless tobacco: Never Used     Comment: black and milds   . Alcohol use Yes     Comment: rarely   beer   . Drug use: No  . Sexual activity: Not Currently    Partners: Female    Birth control/ protection: None, Condom     Comment: patient given condoms   Other Topics Concern  . None   Social History Narrative  . None    Previous Regimen: Stribild  Current Regimen: Off  Labs: HIV 1 RNA Quant (copies/mL)  Date Value  04/12/2014 18,208 (H)  01/03/2014 379 (H)  10/05/2013 392 (H)   CD4 T Cell Abs (/uL)  Date Value  04/27/2016 260 (L)  04/12/2014 230 (L)  01/03/2014 300 (L)   Hep B S Ab (no units)  Date Value  03/03/2012 NONREACTIVE   Hepatitis B Surface Ag (no units)  Date Value  03/03/2012 NEGATIVE   HCV Ab (no units)  Date Value  03/03/2012 NEGATIVE    CrCl: Estimated Creatinine Clearance: 65.5 mL/min (by C-G formula based on SCr of 1.29 mg/dL).  Lipids:    Component Value Date/Time   CHOL 137 04/27/2016 1458   TRIG 256 (H) 04/27/2016 1458   HDL 16 (L) 04/27/2016 1458   CHOLHDL 8.6 (H) 04/27/2016 1458   VLDL 51 (H) 04/27/2016 1458   LDLCALC 70 04/27/2016 1458    Assessment: Donald Wheeler is here to re-establish care with us. He was previously on Stribild before switching to [place]voya. Then he fell out of care with us. Recently, his  mother died. He has been off of therapy completely so his VL is out of control again. He filled out his ADAP app a couple of weeks ago but it hasn't been approved yet. We are going to [facility] him in the mean time. He doesn't have a stable place to stay at this time and is moving places to places to stay with friends and family. I just checked and his [facility] is already approved. Since he doesn't have a permanent address, the meds will be mailed to the clinic until his ADAP is approved. [facility] [address].   Recommendations:  [facility]  Ulyses SouthwardMinh Pham, PharmD, BCPS, AAHIVP, CPP Clinical Infectious Disease Pharmacist [facility] 05/11/2016, 12:09 PM

## 2016-05-11 NOTE — Assessment & Plan Note (Addendum)
Will get him back on adap Will get him back on genvoya Given condoms Refuses flu shot.  Mening vax pharm meeting with him today.  Will see him back in 2 months.

## 2016-05-11 NOTE — Assessment & Plan Note (Signed)
Will resume his HCTZ Add ace if not improved.  rtc in 2 months.

## 2016-05-11 NOTE — Addendum Note (Signed)
Addended by: Andree CossHOWELL, Sharifah Champine M on: 05/11/2016 12:15 PM   Modules accepted: Orders

## 2016-06-17 ENCOUNTER — Other Ambulatory Visit: Payer: Self-pay | Admitting: *Deleted

## 2016-06-17 DIAGNOSIS — B20 Human immunodeficiency virus [HIV] disease: Secondary | ICD-10-CM

## 2016-06-17 MED ORDER — ELVITEG-COBIC-EMTRICIT-TENOFAF 150-150-200-10 MG PO TABS: 1.0000 | ORAL_TABLET | Freq: Every day | ORAL | 1 refills | Status: DC

## 2016-06-25 ENCOUNTER — Encounter: Payer: Self-pay | Admitting: Infectious Diseases

## 2016-06-25 ENCOUNTER — Ambulatory Visit (INDEPENDENT_AMBULATORY_CARE_PROVIDER_SITE_OTHER): Payer: Self-pay | Admitting: Infectious Diseases

## 2016-06-25 ENCOUNTER — Telehealth: Payer: Self-pay | Admitting: *Deleted

## 2016-06-25 VITALS — BP 185/116 | HR 98 | Temp 99.4°F | Ht 67.0 in | Wt 172.0 lb

## 2016-06-25 DIAGNOSIS — I10 Essential (primary) hypertension: Secondary | ICD-10-CM

## 2016-06-25 DIAGNOSIS — B20 Human immunodeficiency virus [HIV] disease: Secondary | ICD-10-CM

## 2016-06-25 DIAGNOSIS — E781 Pure hyperglyceridemia: Secondary | ICD-10-CM

## 2016-06-25 NOTE — Progress Notes (Signed)
   Subjective:    Patient ID: Donald Wheeler, male    DOB: 1970-05-29, 46 y.o.   MRN: [id number]  HPI 46 yo M dx with AIDS 12-2011. He was started on genvoya in July 2015 and has had issues with compliance.  Has been back on art, genvoya after a lapse. States he is taking qam.  He has not started his anti-htn rx yet. Smoked a cigar prior to visit today.   HIV 1 RNA Quant (copies/mL)  Date Value  04/12/2014 18,208 (H)  01/03/2014 379 (H)  10/05/2013 392 (H)   CD4 T Cell Abs (/uL)  Date Value  04/27/2016 260 (L)  04/12/2014 230 (L)  01/03/2014 300 (L)    Review of Systems  Constitutional: Negative for appetite change and unexpected weight change.  Respiratory: Positive for cough. Negative for shortness of breath.   Cardiovascular: Negative for chest pain.  Gastrointestinal: Negative for constipation and diarrhea.  Genitourinary: Negative for difficulty urinating.  Neurological: Negative for headaches.  Please see HPI. 12 point ROS o/w (-)     Objective:   Physical Exam  Constitutional: He appears well-developed and well-nourished.  HENT:  Mouth/Throat: No oropharyngeal exudate.  Eyes: EOM are normal. Pupils are equal, round, and reactive to light.  Neck: Neck supple.  Cardiovascular: Normal rate, regular rhythm and normal heart sounds.   Pulmonary/Chest: Effort normal and breath sounds normal.  Abdominal: Soft. Bowel sounds are normal. There is no tenderness. There is no rebound.  Musculoskeletal: He exhibits no edema.  Lymphadenopathy:    He has no cervical adenopathy.  Psychiatric: He has a normal mood and affect.       Assessment & Plan:

## 2016-06-25 NOTE — Assessment & Plan Note (Signed)
Will recheck in 4 months , consider fibrate, fish oil.

## 2016-06-25 NOTE — Assessment & Plan Note (Addendum)
BP recheck today:  rtc in 2 months for BP check He will get his rx from pharmacy

## 2016-06-25 NOTE — Telephone Encounter (Signed)
Referral for opthalmology given to Diane, referral coordinator. Patient is self pay and said he will try to come up with what is needed for eye exam. Advised patient that if he can get an Rx for glasses, he can go online and it is much cheaper.

## 2016-06-25 NOTE — Assessment & Plan Note (Signed)
He is doing well Want ophtho eval, dental Offered/refused condoms.  Appears to be adherent Recheck labs in 4 months.

## 2016-08-25 ENCOUNTER — Ambulatory Visit: Payer: Self-pay

## 2016-08-25 ENCOUNTER — Encounter: Payer: Self-pay | Admitting: Infectious Diseases

## 2016-08-25 ENCOUNTER — Other Ambulatory Visit: Payer: Self-pay

## 2016-08-25 DIAGNOSIS — B20 Human immunodeficiency virus [HIV] disease: Secondary | ICD-10-CM

## 2016-08-25 DIAGNOSIS — I10 Essential (primary) hypertension: Secondary | ICD-10-CM

## 2016-08-25 MED ORDER — ELVITEG-COBIC-EMTRICIT-TENOFAF 150-150-200-10 MG PO TABS: 1.0000 | ORAL_TABLET | Freq: Every day | ORAL | 2 refills | Status: DC

## 2016-08-25 MED ORDER — LISINOPRIL-HYDROCHLOROTHIAZIDE 10-12.5 MG PO TABS: 1.0000 | ORAL_TABLET | Freq: Every day | ORAL | 3 refills | Status: DC

## 2016-08-25 NOTE — Assessment & Plan Note (Signed)
Pt here for nurse visit for f/u of HTN.  No CP, no SOB, no headaches.  His BP is much worse since starting HCTZ.  Will change him to hctz- ace Have him back in 2 weeks for BP check.

## 2016-08-25 NOTE — Assessment & Plan Note (Signed)
Will refill his ART while here

## 2016-10-12 ENCOUNTER — Other Ambulatory Visit: Payer: Self-pay

## 2016-10-26 ENCOUNTER — Ambulatory Visit: Payer: Self-pay | Admitting: Infectious Diseases

## 2017-01-20 ENCOUNTER — Other Ambulatory Visit: Payer: Self-pay | Admitting: Infectious Diseases

## 2017-01-20 DIAGNOSIS — B20 Human immunodeficiency virus [HIV] disease: Secondary | ICD-10-CM

## 2017-02-03 ENCOUNTER — Ambulatory Visit: Payer: Self-pay

## 2017-02-23 ENCOUNTER — Ambulatory Visit (INDEPENDENT_AMBULATORY_CARE_PROVIDER_SITE_OTHER): Payer: Self-pay | Admitting: Pharmacist

## 2017-02-23 DIAGNOSIS — B20 Human immunodeficiency virus [HIV] disease: Secondary | ICD-10-CM

## 2017-02-23 MED ORDER — ELVITEG-COBIC-EMTRICIT-TENOFAF 150-150-200-10 MG PO TABS: 1.0000 | ORAL_TABLET | Freq: Every day | ORAL | 5 refills | Status: DC
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
## 2017-02-24 ENCOUNTER — Encounter: Payer: Self-pay | Admitting: Internal Medicine

## 2017-02-24 LAB — T-HELPER CELL (CD4) - (RCID CLINIC ONLY)
CD4 % Helper T Cell: 11 % — ABNORMAL LOW (ref 33–55)
CD4 T Cell Abs: 370 /uL — ABNORMAL LOW (ref 400–2700)

## 2017-02-24 LAB — COMPREHENSIVE METABOLIC PANEL
AG RATIO: 1.2 (calc) (ref 1.0–2.5)
ALT: 14 U/L (ref 9–46)
AST: 16 U/L (ref 10–40)
Albumin: 4 g/dL (ref 3.6–5.1)
Alkaline phosphatase (APISO): 66 U/L (ref 40–115)
BILIRUBIN TOTAL: 0.4 mg/dL (ref 0.2–1.2)
BUN/Creatinine Ratio: 12 (calc) (ref 6–22)
BUN: 19 mg/dL (ref 7–25)
CO2: 27 mmol/L (ref 20–32)
CREATININE: 1.64 mg/dL — AB (ref 0.60–1.35)
Calcium: 9.3 mg/dL (ref 8.6–10.3)
Chloride: 104 mmol/L (ref 98–110)
GLOBULIN: 3.4 g/dL (ref 1.9–3.7)
GLUCOSE: 107 mg/dL — AB (ref 65–99)
POTASSIUM: 4.2 mmol/L (ref 3.5–5.3)
Sodium: 137 mmol/L (ref 135–146)
TOTAL PROTEIN: 7.4 g/dL (ref 6.1–8.1)

## 2017-02-24 LAB — CBC WITH DIFFERENTIAL/PLATELET
BASOS ABS: 29 {cells}/uL (ref 0–200)
BASOS PCT: 0.4 %
EOS ABS: 79 {cells}/uL (ref 15–500)
Eosinophils Relative: 1.1 %
HEMATOCRIT: 43.8 % (ref 38.5–50.0)
HEMOGLOBIN: 14.8 g/dL (ref 13.2–17.1)
LYMPHS ABS: 3182 {cells}/uL (ref 850–3900)
MCH: 29.8 pg (ref 27.0–33.0)
MCHC: 33.8 g/dL (ref 32.0–36.0)
MCV: 88.1 fL (ref 80.0–100.0)
MONOS PCT: 10.6 %
MPV: 11.8 fL (ref 7.5–12.5)
NEUTROS ABS: 3146 {cells}/uL (ref 1500–7800)
Neutrophils Relative %: 43.7 %
Platelets: 196 10*3/uL (ref 140–400)
RBC: 4.97 10*6/uL (ref 4.20–5.80)
RDW: 14.3 % (ref 11.0–15.0)
Total Lymphocyte: 44.2 %
WBC mixed population: 763 cells/uL (ref 200–950)
WBC: 7.2 10*3/uL (ref 3.8–10.8)

## 2017-02-24 LAB — LIPID PANEL
CHOL/HDL RATIO: 7.4 (calc) — AB (ref <5.0)
CHOLESTEROL: 221 mg/dL — AB (ref <200)
HDL: 30 mg/dL — AB (ref >40)
Non-HDL Cholesterol (Calc): 191 mg/dL (calc) — ABNORMAL HIGH (ref <130)
TRIGLYCERIDES: 453 mg/dL — AB (ref <150)

## 2017-02-24 LAB — RPR: RPR Ser Ql: NONREACTIVE

## 2017-02-28 LAB — HIV RNA, RTPCR W/R GT (RTI, PI,INT)
HIV 1 RNA Quant: 66 copies/mL — ABNORMAL HIGH
HIV-1 RNA QUANT, LOG: 1.82 {Log_copies}/mL — AB

## 2017-03-05 ENCOUNTER — Other Ambulatory Visit: Payer: Self-pay | Admitting: Pharmacist

## 2017-03-05 DIAGNOSIS — I10 Essential (primary) hypertension: Secondary | ICD-10-CM

## 2017-03-05 MED ORDER — LISINOPRIL-HYDROCHLOROTHIAZIDE 10-12.5 MG PO TABS: 1.0000 | ORAL_TABLET | Freq: Every day | ORAL | 5 refills | Status: DC

## 2017-03-05 MED FILL — LISINOPRIL-HCTZ 10-12.5 MG: 10-12.5 | 30 days supply | Qty: 30 | Fill #0

## 2017-03-05 MED FILL — GENVOYA TABLET: 150-150-200 | 30 days supply | Qty: 30 | Fill #0

## 2017-04-09 ENCOUNTER — Emergency Department (HOSPITAL_COMMUNITY): Payer: Self-pay

## 2017-04-09 ENCOUNTER — Encounter (HOSPITAL_COMMUNITY): Payer: Self-pay

## 2017-04-09 ENCOUNTER — Observation Stay (HOSPITAL_BASED_OUTPATIENT_CLINIC_OR_DEPARTMENT_OTHER): Payer: Self-pay

## 2017-04-09 ENCOUNTER — Other Ambulatory Visit: Payer: Self-pay

## 2017-04-09 ENCOUNTER — Observation Stay (HOSPITAL_COMMUNITY)
Admission: EM | Admit: 2017-04-09 | Discharge: 2017-04-10 | Disposition: A | Discharge status: Home / Self Care | Payer: Self-pay | Attending: Student in an Organized Health Care Education/Training Program | Admitting: Student in an Organized Health Care Education/Training Program

## 2017-04-09 ENCOUNTER — Observation Stay (HOSPITAL_COMMUNITY): Payer: Self-pay

## 2017-04-09 DIAGNOSIS — I10 Essential (primary) hypertension: Secondary | ICD-10-CM | POA: Insufficient documentation

## 2017-04-09 DIAGNOSIS — Z823 Family history of stroke: Secondary | ICD-10-CM

## 2017-04-09 DIAGNOSIS — I6330 Cerebral infarction due to thrombosis of unspecified cerebral artery: Principal | ICD-10-CM | POA: Insufficient documentation

## 2017-04-09 DIAGNOSIS — E119 Type 2 diabetes mellitus without complications: Secondary | ICD-10-CM

## 2017-04-09 DIAGNOSIS — F17290 Nicotine dependence, other tobacco product, uncomplicated: Secondary | ICD-10-CM | POA: Insufficient documentation

## 2017-04-09 DIAGNOSIS — N183 Chronic kidney disease, stage 3 unspecified: Secondary | ICD-10-CM | POA: Insufficient documentation

## 2017-04-09 DIAGNOSIS — I63532 Cerebral infarction due to unspecified occlusion or stenosis of left posterior cerebral artery: Secondary | ICD-10-CM

## 2017-04-09 DIAGNOSIS — B20 Human immunodeficiency virus [HIV] disease: Secondary | ICD-10-CM

## 2017-04-09 DIAGNOSIS — F17200 Nicotine dependence, unspecified, uncomplicated: Secondary | ICD-10-CM | POA: Insufficient documentation

## 2017-04-09 DIAGNOSIS — G459 Transient cerebral ischemic attack, unspecified: Secondary | ICD-10-CM

## 2017-04-09 DIAGNOSIS — R29818 Other symptoms and signs involving the nervous system: Secondary | ICD-10-CM | POA: Diagnosis present

## 2017-04-09 DIAGNOSIS — I639 Cerebral infarction, unspecified: Secondary | ICD-10-CM

## 2017-04-09 DIAGNOSIS — R911 Solitary pulmonary nodule: Secondary | ICD-10-CM | POA: Insufficient documentation

## 2017-04-09 DIAGNOSIS — I998 Other disorder of circulatory system: Secondary | ICD-10-CM

## 2017-04-09 DIAGNOSIS — Z79899 Other long term (current) drug therapy: Secondary | ICD-10-CM | POA: Insufficient documentation

## 2017-04-09 DIAGNOSIS — E781 Pure hyperglyceridemia: Secondary | ICD-10-CM

## 2017-04-09 DIAGNOSIS — G510 Bell's palsy: Secondary | ICD-10-CM | POA: Insufficient documentation

## 2017-04-09 DIAGNOSIS — Z21 Asymptomatic human immunodeficiency virus [HIV] infection status: Secondary | ICD-10-CM | POA: Insufficient documentation

## 2017-04-09 DIAGNOSIS — R531 Weakness: Secondary | ICD-10-CM | POA: Insufficient documentation

## 2017-04-09 DIAGNOSIS — Z8249 Family history of ischemic heart disease and other diseases of the circulatory system: Secondary | ICD-10-CM

## 2017-04-09 HISTORY — DX: Essential (primary) hypertension: I10

## 2017-04-09 HISTORY — DX: Human immunodeficiency virus (HIV) disease: B20

## 2017-04-09 HISTORY — DX: Asymptomatic human immunodeficiency virus (hiv) infection status: Z21

## 2017-04-09 HISTORY — DX: Cerebral infarction, unspecified: I63.9

## 2017-04-09 LAB — COMPREHENSIVE METABOLIC PANEL
ALBUMIN: 4.1 g/dL (ref 3.5–5.0)
ALT: 15 U/L — ABNORMAL LOW (ref 17–63)
AST: 21 U/L (ref 15–41)
Alkaline Phosphatase: 73 U/L (ref 38–126)
Anion gap: 12 (ref 5–15)
BUN: 13 mg/dL (ref 6–20)
CHLORIDE: 103 mmol/L (ref 101–111)
CO2: 24 mmol/L (ref 22–32)
Calcium: 9.2 mg/dL (ref 8.9–10.3)
Creatinine, Ser: 1.48 mg/dL — ABNORMAL HIGH (ref 0.61–1.24)
GFR calc Af Amer: >60 mL/min (ref >60)
GFR, EST NON AFRICAN AMERICAN: 54 mL/min — AB (ref >60)
GLUCOSE: 88 mg/dL (ref 65–99)
POTASSIUM: 3.5 mmol/L (ref 3.5–5.1)
Sodium: 139 mmol/L (ref 135–145)
Total Bilirubin: 0.7 mg/dL (ref 0.3–1.2)
Total Protein: 8.2 g/dL — ABNORMAL HIGH (ref 6.5–8.1)

## 2017-04-09 LAB — I-STAT CHEM 8, ED
BUN: 13 mg/dL (ref 6–20)
Calcium, Ion: 1.06 mmol/L — ABNORMAL LOW (ref 1.15–1.40)
Chloride: 102 mmol/L (ref 101–111)
Creatinine, Ser: 1.5 mg/dL — ABNORMAL HIGH (ref 0.61–1.24)
Glucose, Bld: 89 mg/dL (ref 65–99)
HEMATOCRIT: 48 % (ref 39.0–52.0)
Hemoglobin: 16.3 g/dL (ref 13.0–17.0)
Potassium: 3.5 mmol/L (ref 3.5–5.1)
SODIUM: 141 mmol/L (ref 135–145)
TCO2: 26 mmol/L (ref 22–32)

## 2017-04-09 LAB — DIFFERENTIAL
BASOS ABS: 0 10*3/uL (ref 0.0–0.1)
BASOS PCT: 1 %
EOS ABS: 0.1 10*3/uL (ref 0.0–0.7)
Eosinophils Relative: 1 %
Lymphocytes Relative: 45 %
Lymphs Abs: 3.5 10*3/uL (ref 0.7–4.0)
Monocytes Absolute: 0.8 10*3/uL (ref 0.1–1.0)
Monocytes Relative: 10 %
NEUTROS PCT: 43 %
Neutro Abs: 3.4 10*3/uL (ref 1.7–7.7)

## 2017-04-09 LAB — CBG MONITORING, ED
GLUCOSE-CAPILLARY: 80 mg/dL (ref 65–99)
GLUCOSE-CAPILLARY: 87 mg/dL (ref 65–99)

## 2017-04-09 LAB — CBC
HCT: 46.4 % (ref 39.0–52.0)
Hemoglobin: 15.9 g/dL (ref 13.0–17.0)
MCH: 29.9 pg (ref 26.0–34.0)
MCHC: 34.3 g/dL (ref 30.0–36.0)
MCV: 87.2 fL (ref 78.0–100.0)
Platelets: 200 10*3/uL (ref 150–400)
RBC: 5.32 MIL/uL (ref 4.22–5.81)
RDW: 12.9 % (ref 11.5–15.5)
WBC: 7.8 10*3/uL (ref 4.0–10.5)

## 2017-04-09 LAB — LIPID PANEL
CHOL/HDL RATIO: 8.4 ratio
CHOLESTEROL: 234 mg/dL — AB (ref 0–200)
HDL: 28 mg/dL — AB (ref >40)
LDL Cholesterol: UNDETERMINED mg/dL (ref 0–99)
Triglycerides: 413 mg/dL — ABNORMAL HIGH (ref <150)
VLDL: UNDETERMINED mg/dL (ref 0–40)

## 2017-04-09 LAB — ECHOCARDIOGRAM COMPLETE
Height: 67 in
Weight: 3072 oz

## 2017-04-09 LAB — PROTIME-INR
INR: 1.04
Prothrombin Time: 13.5 seconds (ref 11.4–15.2)

## 2017-04-09 LAB — I-STAT TROPONIN, ED: TROPONIN I, POC: 0.02 ng/mL (ref 0.00–0.08)

## 2017-04-09 LAB — APTT: APTT: 32 s (ref 24–36)

## 2017-04-09 LAB — HEMOGLOBIN A1C
Hgb A1c MFr Bld: 6.6 % — ABNORMAL HIGH (ref 4.8–5.6)
Mean Plasma Glucose: 142.72 mg/dL

## 2017-04-09 MED ORDER — ENOXAPARIN SODIUM 40 MG/0.4ML ~~LOC~~ SOLN
40.0000 mg | SUBCUTANEOUS | Status: DC
  Administered 2017-04-09: 40 mg via SUBCUTANEOUS
  Filled 2017-04-09: qty 0.4

## 2017-04-09 MED ORDER — ASPIRIN EC 81 MG PO TBEC
81.0000 mg | DELAYED_RELEASE_TABLET | Freq: Every day | ORAL | Status: DC
  Administered 2017-04-09 – 2017-04-10 (×2): 81 mg via ORAL
  Filled 2017-04-09 (×2): qty 1

## 2017-04-09 MED ORDER — ONDANSETRON HCL 4 MG/2ML IJ SOLN: 4.0000 mg | Freq: Four times a day (QID) | INTRAMUSCULAR | Status: DC | PRN

## 2017-04-09 MED ORDER — ONDANSETRON HCL 4 MG PO TABS: 4.0000 mg | ORAL_TABLET | Freq: Four times a day (QID) | ORAL | Status: DC | PRN

## 2017-04-09 MED ORDER — ACETAMINOPHEN 650 MG RE SUPP: 650.0000 mg | Freq: Four times a day (QID) | RECTAL | Status: DC | PRN

## 2017-04-09 MED ORDER — SENNOSIDES-DOCUSATE SODIUM 8.6-50 MG PO TABS: 1.0000 | ORAL_TABLET | Freq: Every evening | ORAL | Status: DC | PRN

## 2017-04-09 MED ORDER — ATORVASTATIN CALCIUM 80 MG PO TABS
80.0000 mg | ORAL_TABLET | Freq: Every day | ORAL | Status: DC
  Administered 2017-04-09: 80 mg via ORAL
  Filled 2017-04-09 (×2): qty 1

## 2017-04-09 MED ORDER — GADOBENATE DIMEGLUMINE 529 MG/ML IV SOLN
20.0000 mL | Freq: Once | INTRAVENOUS | Status: AC | PRN
  Administered 2017-04-09: 18 mL via INTRAVENOUS

## 2017-04-09 MED ORDER — IOPAMIDOL (ISOVUE-370) INJECTION 76%
INTRAVENOUS | Status: AC
  Filled 2017-04-09: qty 100

## 2017-04-09 MED ORDER — ACETAMINOPHEN 325 MG PO TABS
650.0000 mg | ORAL_TABLET | Freq: Four times a day (QID) | ORAL | Status: DC | PRN
Start: 2017-04-09 — End: 2017-04-10

## 2017-04-09 MED ORDER — ELVITEG-COBIC-EMTRICIT-TENOFAF 150-150-200-10 MG PO TABS
1.0000 | ORAL_TABLET | Freq: Every day | ORAL | Status: DC
  Administered 2017-04-10: 1 via ORAL
  Filled 2017-04-09: qty 1

## 2017-04-09 NOTE — Progress Notes (Signed)
  Echocardiogram 2D Echocardiogram has been performed.  Donald Wheeler, Julies Carmickle R 04/09/2017, 4:36 PM

## 2017-04-09 NOTE — ED Notes (Addendum)
Report given to Windell MouldingRuth on [facility].  Patient going to [facility] room 08.  Patient has cell phone, 2 earrings, clothing and boots, jacket and hat

## 2017-04-09 NOTE — Progress Notes (Signed)
Pt. Arrived to the unit Alert and Oriented X 4  Denies pain  All questions and concern addressed, bed in lowest positions.

## 2017-04-09 NOTE — ED Triage Notes (Signed)
Pt endorses right side numbness that began at 0800 this morning with right side weakness. No drift noted but pt is weaker in the right arm/leg than the left. LSN 0800 when sx began suddenly. Hypertensive. Axox4. No visual changes.

## 2017-04-09 NOTE — ED Provider Notes (Signed)
[facility] EMERGENCY DEPARTMENT Provider Note   CSN: [id number] Arrival date & time: 04/09/17  1214     History   Chief Complaint Chief Complaint  Patient presents with  . Code Stroke    HPI Donald Wheeler is a 47 y.o. male history of hypertension, here presenting with trouble speaking, right-sided weakness.  Patient states that he woke around 6:30 AM and felt fine and around 8 AM, he had acute onset of right-sided weakness as well as trouble speaking.  He thought that the symptoms will resolve but they did not so he came in to the ER around 12:20 PM.  Patient has hx of HIV and is compliant with meds. Last CD4 was a month ago and was 370 and vital load is 66. Patient follows up with Dr. Ninetta Lights from [facility] but doesn't have PCP. No hx of stroke   The history is provided by the patient.    Past Medical History:  Diagnosis Date  . Hypertension     Patient Active Problem List   Diagnosis Date Noted  . HTN (hypertension), benign 09/07/2013  . Bell's palsy 09/07/2013  . Hypertriglyceridemia 09/07/2013  . AIDS (HCC) 03/17/2012    History reviewed. No pertinent surgical history.     Home Medications    Prior to Admission medications   Medication Sig Start Date End Date Taking? Authorizing Provider  elvitegravir-cobicistat-emtricitabine-tenofovir (GENVOYA) 150-150-200-10 MG TABS tablet Take 1 tablet by mouth daily with breakfast. 02/23/17   Kuppelweiser, Cassie L, RPH-CPP  lisinopril-hydrochlorothiazide (PRINZIDE,ZESTORETIC) 10-12.5 MG tablet Take 1 tablet by mouth daily. 03/05/17   Kuppelweiser, Cassie L, RPH-CPP    Family History History reviewed. No pertinent family history.  Social History Social History   Tobacco Use  . Smoking status: Current Every Day Smoker    Packs/day: 0.20    Years: 6.00    Pack years: 1.20    Types: Cigars  . Smokeless tobacco: Never Used  . Tobacco comment: black and milds   Substance Use Topics  . Alcohol use: Yes   Comment: rarely   beer   . Drug use: No     Allergies   Patient has no known allergies.   Review of Systems Review of Systems  Neurological: Positive for speech difficulty and weakness.  All other systems reviewed and are negative.    Physical Exam Updated Vital Signs BP (!) 162/107   Pulse 76   Temp 98.3 F (36.8 C)   Resp 19   Ht 5\' 7"  (1.702 m)   Wt 87.1 kg (192 lb)   SpO2 98%   BMI 30.07 kg/m   Physical Exam  Constitutional: He is oriented to person, place, and time. He appears well-nourished.  HENT:  Head: Normocephalic.  Mouth/Throat: Oropharynx is clear and moist.  Eyes: Conjunctivae and EOM are normal. Pupils are equal, round, and reactive to light.  Neck: Normal range of motion. Neck supple.  Cardiovascular: Normal rate, regular rhythm and normal heart sounds.  Pulmonary/Chest: Effort normal and breath sounds normal. No stridor. No respiratory distress. He has no wheezes.  Abdominal: Soft. Bowel sounds are normal. He exhibits no distension. There is no tenderness.  Musculoskeletal: Normal range of motion.  Neurological: He is alert and oriented to person, place, and time.  CN 2-12 intact. Strength 4/5 L arm and leg, 5/5 R arm and leg. No obvious pronator drift.   Skin: Skin is warm.  Psychiatric: He has a normal mood and affect.  Nursing note and vitals reviewed.  ED Treatments / Results  Labs (all labs ordered are listed, but only abnormal results are displayed) Labs Reviewed  COMPREHENSIVE METABOLIC PANEL - Abnormal; Notable for the following components:      Result Value   Creatinine, Ser 1.48 (*)    Total Protein 8.2 (*)    ALT 15 (*)    GFR calc non Af Amer 54 (*)    All other components within normal limits  I-STAT CHEM 8, ED - Abnormal; Notable for the following components:   Creatinine, Ser 1.50 (*)    Calcium, Ion 1.06 (*)    All other components within normal limits  PROTIME-INR  APTT  CBC  DIFFERENTIAL  I-STAT TROPONIN, ED  CBG  MONITORING, ED  CBG MONITORING, ED    EKG  EKG Interpretation  Date/Time:  Friday April 09 2017 12:18:15 EST Ventricular Rate:  99 PR Interval:  140 QRS Duration: 144 QT Interval:  384 QTC Calculation: 492 R Axis:   61 Text Interpretation:  Normal sinus rhythm Right bundle branch block Abnormal ECG RBBB new since 2015 Confirmed by Richardean Canal ([id number]) on 04/09/2017 12:25:17 PM       Radiology Ct Angio Head W Or Wo Contrast  Result Date: 04/09/2017 CLINICAL DATA:  Focal neuro deficit. Right-sided numbness that began this morning. Right-sided weakness. Hypertension and HIV. EXAM: CT ANGIOGRAPHY HEAD AND NECK CT PERFUSION BRAIN TECHNIQUE: Multidetector CT imaging of the head and neck was performed using the standard protocol during bolus administration of intravenous contrast. Multiplanar CT image reconstructions and MIPs were obtained to evaluate the vascular anatomy. Carotid stenosis measurements (when applicable) are obtained utilizing NASCET criteria, using the distal internal carotid diameter as the denominator. Multiphase CT imaging of the brain was performed following IV bolus contrast injection. Subsequent parametric perfusion maps were calculated using RAPID software. CONTRAST:  Reference EMR COMPARISON:  Noncontrast head CT earlier today FINDINGS: CTA NECK FINDINGS Aortic arch: Normal.  Three vessel branching. Right carotid system: Minimal to no atheromatous changes of the ICA bulb. Mild ICA tortuosity. No stenosis, ulceration, or beading. Left carotid system: No definite atheromatous changes. Mild ICA tortuosity. No stenosis, ulceration, or beading. Vertebral arteries: No proximal subclavian stenosis. Codominant vertebral arteries that are both smooth and widely patent to the dura. Skeleton: No acute or aggressive finding Other neck: Thickened tonsillar tissue which may be related patient's history of HIV. Upper chest: 9 mm ill-defined left upper lobe pulmonary nodule. Review of the  MIP images confirms the above findings CTA HEAD FINDINGS Anterior circulation: The carotid siphons are smooth and diffusely patent. Hypoplastic right A1 segment with robust anterior communicating artery. There is no branch occlusion, stenosis, or ulceration. Posterior circulation: Codominant vertebral arteries. The vertebral and basilar arteries are smooth and widely patent. Bilateral P1 hypoplasia. Circle-of-Willis is intact. Negative PCAs. Negative for aneurysm Venous sinuses: Negative Anatomic variants: As above Delayed phase: Not performed in the emergent setting. There is cerebral white matter disease based on preceding noncontrast CT Review of the MIP images confirms the above findings CT Brain Perfusion Findings: CBF (<30%) Volume: 0mL Perfusion (Tmax>6.0s) volume: 3mL, which is artifactual based on position over the atrium of the right lateral ventricle. Case discussed with Dr. Wilford Corner in person. IMPRESSION: 1. Negative CTA of the head and neck. No infarct or penumbra by cerebral perfusion. 2. 9 mm left upper lobe pulmonary nodule. Consider one of the following in 3 months for both low-risk and high-risk individuals: (a) repeat chest CT, (b) follow-up PET-CT, or (c)  tissue sampling. This recommendation follows the consensus statement: Guidelines for Management of Incidental Pulmonary Nodules Detected on CT Images: From the Fleischner Society 2017; Radiology 2017; 284:228-243. Electronically Signed   By: Marnee Spring M.D.   On: 04/09/2017 13:10   Ct Angio Neck W Or Wo Contrast  Result Date: 04/09/2017 CLINICAL DATA:  Focal neuro deficit. Right-sided numbness that began this morning. Right-sided weakness. Hypertension and HIV. EXAM: CT ANGIOGRAPHY HEAD AND NECK CT PERFUSION BRAIN TECHNIQUE: Multidetector CT imaging of the head and neck was performed using the standard protocol during bolus administration of intravenous contrast. Multiplanar CT image reconstructions and MIPs were obtained to evaluate the  vascular anatomy. Carotid stenosis measurements (when applicable) are obtained utilizing NASCET criteria, using the distal internal carotid diameter as the denominator. Multiphase CT imaging of the brain was performed following IV bolus contrast injection. Subsequent parametric perfusion maps were calculated using RAPID software. CONTRAST:  Reference EMR COMPARISON:  Noncontrast head CT earlier today FINDINGS: CTA NECK FINDINGS Aortic arch: Normal.  Three vessel branching. Right carotid system: Minimal to no atheromatous changes of the ICA bulb. Mild ICA tortuosity. No stenosis, ulceration, or beading. Left carotid system: No definite atheromatous changes. Mild ICA tortuosity. No stenosis, ulceration, or beading. Vertebral arteries: No proximal subclavian stenosis. Codominant vertebral arteries that are both smooth and widely patent to the dura. Skeleton: No acute or aggressive finding Other neck: Thickened tonsillar tissue which may be related patient's history of HIV. Upper chest: 9 mm ill-defined left upper lobe pulmonary nodule. Review of the MIP images confirms the above findings CTA HEAD FINDINGS Anterior circulation: The carotid siphons are smooth and diffusely patent. Hypoplastic right A1 segment with robust anterior communicating artery. There is no branch occlusion, stenosis, or ulceration. Posterior circulation: Codominant vertebral arteries. The vertebral and basilar arteries are smooth and widely patent. Bilateral P1 hypoplasia. Circle-of-Willis is intact. Negative PCAs. Negative for aneurysm Venous sinuses: Negative Anatomic variants: As above Delayed phase: Not performed in the emergent setting. There is cerebral white matter disease based on preceding noncontrast CT Review of the MIP images confirms the above findings CT Brain Perfusion Findings: CBF (<30%) Volume: 0mL Perfusion (Tmax>6.0s) volume: 3mL, which is artifactual based on position over the atrium of the right lateral ventricle. Case  discussed with Dr. Wilford Corner in person. IMPRESSION: 1. Negative CTA of the head and neck. No infarct or penumbra by cerebral perfusion. 2. 9 mm left upper lobe pulmonary nodule. Consider one of the following in 3 months for both low-risk and high-risk individuals: (a) repeat chest CT, (b) follow-up PET-CT, or (c) tissue sampling. This recommendation follows the consensus statement: Guidelines for Management of Incidental Pulmonary Nodules Detected on CT Images: From the Fleischner Society 2017; Radiology 2017; 284:228-243. Electronically Signed   By: Marnee Spring M.D.   On: 04/09/2017 13:10   Ct Cerebral Perfusion W Contrast  Result Date: 04/09/2017 CLINICAL DATA:  Focal neuro deficit. Right-sided numbness that began this morning. Right-sided weakness. Hypertension and HIV. EXAM: CT ANGIOGRAPHY HEAD AND NECK CT PERFUSION BRAIN TECHNIQUE: Multidetector CT imaging of the head and neck was performed using the standard protocol during bolus administration of intravenous contrast. Multiplanar CT image reconstructions and MIPs were obtained to evaluate the vascular anatomy. Carotid stenosis measurements (when applicable) are obtained utilizing NASCET criteria, using the distal internal carotid diameter as the denominator. Multiphase CT imaging of the brain was performed following IV bolus contrast injection. Subsequent parametric perfusion maps were calculated using RAPID software. CONTRAST:  Reference EMR COMPARISON:  Noncontrast head CT earlier today FINDINGS: CTA NECK FINDINGS Aortic arch: Normal.  Three vessel branching. Right carotid system: Minimal to no atheromatous changes of the ICA bulb. Mild ICA tortuosity. No stenosis, ulceration, or beading. Left carotid system: No definite atheromatous changes. Mild ICA tortuosity. No stenosis, ulceration, or beading. Vertebral arteries: No proximal subclavian stenosis. Codominant vertebral arteries that are both smooth and widely patent to the dura. Skeleton: No acute or  aggressive finding Other neck: Thickened tonsillar tissue which may be related patient's history of HIV. Upper chest: 9 mm ill-defined left upper lobe pulmonary nodule. Review of the MIP images confirms the above findings CTA HEAD FINDINGS Anterior circulation: The carotid siphons are smooth and diffusely patent. Hypoplastic right A1 segment with robust anterior communicating artery. There is no branch occlusion, stenosis, or ulceration. Posterior circulation: Codominant vertebral arteries. The vertebral and basilar arteries are smooth and widely patent. Bilateral P1 hypoplasia. Circle-of-Willis is intact. Negative PCAs. Negative for aneurysm Venous sinuses: Negative Anatomic variants: As above Delayed phase: Not performed in the emergent setting. There is cerebral white matter disease based on preceding noncontrast CT Review of the MIP images confirms the above findings CT Brain Perfusion Findings: CBF (<30%) Volume: 0mL Perfusion (Tmax>6.0s) volume: 3mL, which is artifactual based on position over the atrium of the right lateral ventricle. Case discussed with Dr. Wilford Corner in person. IMPRESSION: 1. Negative CTA of the head and neck. No infarct or penumbra by cerebral perfusion. 2. 9 mm left upper lobe pulmonary nodule. Consider one of the following in 3 months for both low-risk and high-risk individuals: (a) repeat chest CT, (b) follow-up PET-CT, or (c) tissue sampling. This recommendation follows the consensus statement: Guidelines for Management of Incidental Pulmonary Nodules Detected on CT Images: From the Fleischner Society 2017; Radiology 2017; 284:228-243. Electronically Signed   By: Marnee Spring M.D.   On: 04/09/2017 13:10   Ct Head Code Stroke Wo Contrast  Result Date: 04/09/2017 CLINICAL DATA:  Code stroke. Right-sided numbness today. Right-sided weakness. AIDS, hypertension EXAM: CT HEAD WITHOUT CONTRAST TECHNIQUE: Contiguous axial images were obtained from the base of the skull through the vertex  without intravenous contrast. COMPARISON:  CT head 07/01/2013 FINDINGS: Brain: Ventricular size and cerebral volume normal. Hypodensity throughout the cerebral white matter has progressed since 2015. No acute infarct, hemorrhage, or mass.  No midline shift. Vascular: Negative for hyperdense vessel Skull: Negative Sinuses/Orbits: Mild mucosal edema sphenoid sinus.  Normal orbit Other: None ASPECTS (Alberta Stroke Program Early CT Score) - Ganglionic level infarction (caudate, lentiform nuclei, internal capsule, insula, M1-M3 cortex): 7 - Supraganglionic infarction (M4-M6 cortex): 3 Total score (0-10 with 10 being normal): 10 IMPRESSION: 1. No acute abnormality 2. ASPECTS is 10 3. Moderate white matter changes with progression since 2015. This may be related to the patient's HIV infection or chronic microvascular ischemia. 4. These results were called by telephone at the time of interpretation on 04/09/2017 at 12:45 pm to Dr. Charlesetta Garibaldi, who verbally acknowledged these results. Electronically Signed   By: Marlan Palau M.D.   On: 04/09/2017 12:46    Procedures Procedures (including critical care time)  Medications Ordered in ED Medications  iopamidol (ISOVUE-370) 76 % injection (not administered)     Initial Impression / Assessment and Plan / ED Course  I have reviewed the triage vital signs and the nursing notes.  Pertinent labs & imaging results that were available during my care of the patient were reviewed by me and considered in my medical decision making (see chart  for details).    Lita MainsRonnie Glinski is a 47 y.o. male here with R arm and leg weakness, some slurred speech that improved. No slurred speech on my exam. He does have R arm and leg weakness. Code stroke activated at 12:24 pm. Will get CT head, labs.   1:38 PM CT head and CT perfusion unremarkable. No TPA per Dr. Wilford CornerArora. He recommend admission for stroke/TIA workup. Patient has hx of HIV so he recommends MRI w/wo but patient has no fever or  headache and CD4 was 370 a month ago so I have low suspicion for opportunistic infection.      Final Clinical Impressions(s) / ED Diagnoses   Final diagnoses:  None    ED Discharge Orders    None       Charlynne PanderYao, David Hsienta, MD 04/09/17 1339

## 2017-04-09 NOTE — H&P (Signed)
Date: 04/09/2017               Wheeler Name:  Donald Wheeler MRN: [id number]  DOB: 1971-01-20 Age / Sex: 47 y.o., male   PCP: Wheeler, No Pcp Per         Medical Service: Internal Medicine Teaching Service         Attending Physician: Dr. Silverio Lay, Gonzella Lex, MD    First Contact: Dr. Lorenso Courier Pager: [phone]  Second Contact: Dr. Noemi Chapel Pager: [phone]       After Hours (After 5p/  First Contact Pager: [phone]  weekends / holidays): Second Contact Pager: [phone]   Chief Complaint: Right Sided Weakness  History of Present Illness: Donald Wheeler is a 47 y.o person with hiv on Genvoya, essential hypertension, and hypertriglyceridemia who presented with right-sided upper and lower extremity weakness and tingling that started at 8:30 AM 04/09/2017. Donald Wheeler states that Donald symptoms lasted a few hours. He did not do any interventions as he thought Donald symptoms would go away, however Donald symptoms persisted. Donald Wheeler states that he has noticed that his blood pressure usually runs high with systolic being 150-180/80-90s.   He denies any headaches, lightheadedness, nausea/vomiting, recent falls.   ED Course: Hypertensive, normal pulse, afebrile, normal respiration, saturating well Code stroke was activated and neurology saw Wheeler   Meds:  No outpatient medications have been marked as taking for Donald 04/09/17 encounter [facility] Encounter).     Allergies: Allergies as of 04/09/2017  . (No Known Allergies)   Past Medical History:  Diagnosis Date  . Hypertension     Family History:   Mother: strokes, htn, died 3 yrs ago  Social History:   Social History   Socioeconomic History  . Marital status: Divorced    Spouse name: None  . Number of children: None  . Years of education: None  . Highest education level: None  Social Needs  . Financial resource strain: None  . Food insecurity - worry: None  . Food insecurity - inability: None  . Transportation needs -  medical: None  . Transportation needs - non-medical: None  Occupational History  . None  Tobacco Use  . Smoking status: Current Every Day Smoker    Packs/day: 0.20    Years: 6.00    Pack years: 1.20    Types: Cigars  . Smokeless tobacco: Never Used  . Tobacco comment: black and milds   Substance and Sexual Activity  . Alcohol use: Yes    Comment: rarely   beer   . Drug use: No  . Sexual activity: Not Currently    Partners: Female    Birth control/protection: None, Condom    Comment: Wheeler given condoms  Other Topics Concern  . None  Social History Narrative  . None   Smokes cigars and marijuana  No other drugs etoh socially  Review of Systems: A complete ROS was negative except as per HPI.   Physical Exam: Blood pressure (!) 162/107, pulse 76, temperature 98.3 F (36.8 C), resp. rate 19, height 5\' 7"  (1.702 m), weight 192 lb (87.1 kg), SpO2 98 %.  Physical Exam  Constitutional: He is oriented to person, place, and time. He appears well-developed and well-nourished. No distress.  HENT:  Head: Normocephalic and atraumatic.  Eyes: Conjunctivae are normal.  Cardiovascular: Normal rate, regular rhythm and normal heart sounds.  No murmur heard. Respiratory: Effort normal and breath sounds normal. No respiratory distress. He has no wheezes.  GI: Soft. He exhibits no distension. There is no tenderness.  Musculoskeletal: He exhibits no edema.  Neurological: He is alert and oriented to person, place, and time. No cranial nerve deficit.  5/5 bilateral upper and lower extremity strength  Skin: He is not diaphoretic.  Psychiatric: He has a normal mood and affect. His behavior is normal. Judgment and thought content normal.   EKG: personally reviewed my interpretation is normal sinus rhythm with right bundle branch block.   CT Head code stroke (04/10/17): 1. No acute abnormality 2. ASPECTS is 10 3. Moderate white matter changes with progression since 2015. This may be  related to Donald Wheeler's HIV infection or chronic microvascular ischemia. 4. These results were called by telephone at Donald time of interpretation on 04/09/2017 at 12:45 pm to Dr. Charlesetta GaribaldiAroura, who verbally acknowledged these results.  CT Angio Head w or wo contrast (04/09/17): 1. Negative CTA of Donald head and neck. No infarct or penumbra by cerebral perfusion. 2. 9 mm left upper lobe pulmonary nodule. Consider one of Donald following in 3 months for both low-risk and high-risk individuals: (a) repeat chest CT, (b) follow-up PET-CT, or (c) tissue sampling.  CT Cerebral perfusion w contrast (04/09/17): 1. Negative CTA of Donald head and neck. No infarct or penumbra by cerebral perfusion. 2. 9 mm left upper lobe pulmonary nodule. Consider one of Donald following in 3 months for both low-risk and high-risk individuals: (a) repeat chest CT, (b) follow-up PET-CT, or (c) tissue sampling.  Assessment & Plan by Problem:  47 y.o person with hiv and essential hypertension presented with new onset right upper and lower extremity weakness and tingling.   Right upper and lower extremity weakness Donald Wheeler presented with few hours of right upper and lower extremity weakness. Donald Wheeler has uncontrolled hypertension and his family history of stokes places concern for possible stroke. CT head was negative for any intracranial abnormalities. CTA head and neck was negatvive for any infarct or decreased perfusion. TPA was not given.    It is possible that Donald Wheeler had a possible TIA and therefore MRI has been ordered.   -Pending MRI -Pending Echo -Cardiac Monitoring  -Allow permissive hypertension for 24-48hrs -HgBAIC pending -Lipid panel pending -Started aspirin 325mg   -Started atorvastatin 80mg  qd -PT, OT, and speech consult   HIV Last viral load (02/23/17)=66, cd4=370. Wheeler is on Genvoya at home   Essential Hypertension Donald Wheeler's blood pressure since admission has ranged 147-162/96-107. On  lisinopril-hctz 10-12.5mg  daily.   -Allowing permissive hypertension for 24-48hrs   Dispo: Admit Wheeler to Inpatient with expected length of stay greater than 2 midnights.  Signed: Lorenso CourierVahini Harnoor Kohles, MD Internal Medicine PGY1 [phone] 04/09/2017, 1:38 PM

## 2017-04-09 NOTE — Consult Note (Addendum)
Neurology Consultation  Reason for Consult: code stroke 1225 hrs Referring Physician: Dr Silverio Lay  CC: RSW  History is obtained from: patient  HPI: Jaceon Heiberger is a 47 y.o. male PMH of HTN, HIV who was in his usual state of health at 0800 when he felt that he had some tingling of RUE and RLE. This went on for a few hours, it progressed to numbness and weaklness of RUE and RLE. He thought symptoms would subside spontaneously but when it didn't, he came to the hospital. No prior h/o strokes of cardiac disease. Has HTN. Says is compliant to meds. Smokes daily. Does marijuana occasionally. No cocaine or opiate abuse. Never had similar symptoms before. Denies headache, visual symptoms, nausea or vomiting. Denies CP, SOB, Abd pain, bleeding or clotting problems.  LKW: 0800 04/09/16 tpa given?: no, OSW Premorbid modified Rankin scale (mRS):  0  ROS: ROS was performed and is negative except as noted in the HPI.  Past Medical History:  Diagnosis Date  . Hypertension    History reviewed. No pertinent family history. No h/o strokes in family  Social History:   reports that he has been smoking cigars.  He has a 1.20 pack-year smoking history. he has never used smokeless tobacco. He reports that he drinks alcohol. He reports that he does not use drugs.  Medications No current facility-administered medications for this encounter.   Current Outpatient Medications:  .  elvitegravir-cobicistat-emtricitabine-tenofovir (GENVOYA) 150-150-200-10 MG TABS tablet, Take 1 tablet by mouth daily with breakfast., Disp: 30 tablet, Rfl: 5 .  lisinopril-hydrochlorothiazide (PRINZIDE,ZESTORETIC) 10-12.5 MG tablet, Take 1 tablet by mouth daily., Disp: 30 tablet, Rfl: 5  Exam: Current vital signs: BP (!) 151/113 (BP Location: Right Arm)   Pulse 100   Temp 98 F (36.7 C) (Oral)   Resp 16   Ht 5\' 7"  (1.702 m)   Wt 87.1 kg (192 lb)   SpO2 100%   BMI 30.07 kg/m  Vital signs in last 24 hours: Temp:  [98  F (36.7 C)] 98 F (36.7 C) (01/25 1222) Pulse Rate:  [100] 100 (01/25 1222) Resp:  [16] 16 (01/25 1222) BP: (151)/(113) 151/113 (01/25 1222) SpO2:  [100 %] 100 % (01/25 1222) Weight:  [87.1 kg (192 lb)] 87.1 kg (192 lb) (01/25 1225) GENERAL: Awake, alert in NAD HEENT: - Normocephalic and atraumatic, dry mm, no LN++, no Thyromegally LUNGS - Clear to auscultation bilaterally with no wheezes CV - S1S2 RRR, no m/r/g, equal pulses bilaterally. ABDOMEN - Soft, nontender, nondistended with normoactive BS Ext: warm, well perfused, intact peripheral pulses, no edema NEURO:  Mental Status: AA&Ox3  Language: speech is clear.  Naming, repetition, fluency, and comprehension intact. Cranial Nerves: PERRL. EOMI, visual fields full, no facial asymmetry, facial sensation intact, hearing intact, tongue/uvula/soft palate midline, normal sternocleidomastoid and trapezius muscle strength. No evidence of tongue atrophy or fibrillations Motor: 4+/5 RUE and RLE with no vertical drift. 5/5 LUE and LLE Tone: is normal and bulk is normal Sensation- Intact to light touch bilaterally Coordination: FTN intact bilaterally, no ataxia in BLE. Gait- deferred  NIHSS - 0  Labs I have reviewed labs in epic and the results pertinent to this consultation are: CBC    Component Value Date/Time   WBC 7.2 02/23/2017 1123   RBC 4.97 02/23/2017 1123   HGB 16.3 04/09/2017 1231   HCT 48.0 04/09/2017 1231   PLT 196 02/23/2017 1123   MCV 88.1 02/23/2017 1123   MCH 29.8 02/23/2017 1123   MCHC 33.8 02/23/2017  1123   RDW 14.3 02/23/2017 1123   LYMPHSABS 3,182 02/23/2017 1123   MONOABS 630 04/27/2016 1458   EOSABS 79 02/23/2017 1123   BASOSABS 29 02/23/2017 1123   CMP     Component Value Date/Time   NA 141 04/09/2017 1231   K 3.5 04/09/2017 1231   CL 102 04/09/2017 1231   CO2 27 02/23/2017 1123   GLUCOSE 89 04/09/2017 1231   BUN 13 04/09/2017 1231   CREATININE 1.50 (H) 04/09/2017 1231   CREATININE 1.64 (H)  02/23/2017 1123   CALCIUM 9.3 02/23/2017 1123   PROT 7.4 02/23/2017 1123   ALBUMIN 2.9 (L) 04/27/2016 1458   AST 16 02/23/2017 1123   ALT 14 02/23/2017 1123   ALKPHOS 71 04/27/2016 1458   BILITOT 0.4 02/23/2017 1123   GFRNONAA 65 04/27/2016 1458   GFRAA 75 04/27/2016 1458   Lipid Panel     Component Value Date/Time   CHOL 221 (H) 02/23/2017 1123   TRIG 453 (H) 02/23/2017 1123   HDL 30 (L) 02/23/2017 1123   CHOLHDL 7.4 (H) 02/23/2017 1123   VLDL 51 (H) 04/27/2016 1458   LDLCALC 70 04/27/2016 1458   Imaging I have reviewed the images obtained:  CT-scan of the brain - ASPECTS 10 CTA/P: no LVO. Pulm nodule that needs OP f/u  Assessment:  Lita MainsRonnie Rohrer is a 47 y.o. male PMH of HTN, HIV who was in his usual state of health at 0800 when he felt that he had some tingling that progressed to numbness and weakness of RUE and RLE. Subtle Right sided weakness but NIHSS 0. Last known normal at 8 AM, seen in the ER at 12:25 PM, by the time CT scan was done, patient outside the window. Also NIH stroke scale 0.  Hence not a candidate for TPA. No large vessel occlusion hence not a candidate for endovascular treatment. Most likely a lacunar stroke versus TIA.  Impression: Lacunar stroke v TIA  Recommendations: -Admit to hospitalist -Telemetry monitoring -Allow for permissive hypertension for the first 24-48h - only treat PRN if SBP >220 mmHg. Blood pressures can be gradually normalized to SBP<140 upon discharge. -MRI brain with and without contrast given h/o HIV -Echocardiogram -HgbA1c, fasting lipid panel -Frequent neuro checks -Prophylactic therapy-Antiplatelet med: Aspirin - dose 325mg  PO or 300mg  PR    -Atorvastatin 80 mg PO daily -Risk factor modification -I discussed the importance of exercise as well as smoking/alcohol/illicit drug use cessation. -PT consult, OT consult, Speech consult  Please page stroke NP/PA/MD (listed on AMION)  from 8am-4 pm as this patient will be  followed by the stroke team at this point.  -- Milon DikesAshish Ellisyn Icenhower, MD [facility] Pager: [phone] If 7pm to 7am, please call on call as listed on AMION.   CRITICAL CARE ATTESTATION This patient is critically ill and at significant risk of neurological worsening, death and care requires constant monitoring of vital signs, hemodynamics,respiratory and cardiac monitoring. I spent 40  minutes of neurocritical care time performing neurological assessment, discussion with family, other specialists and medical decision making of high complexityin the care of  this patient.

## 2017-04-09 NOTE — Code Documentation (Signed)
47[facility] at 1214.  Patient from home where he woke up at his baseline and later developed right sided weakness and altered sensation at 0800.  Patient with h/o HTN and smoking, endorses occasional marijuana use. Code stroke called in the ED.  Stroke team responded to the bedside.  CT completed.  NIHSS 0, see documentation for details and code stroke times.  CTA completed.  Patient is outside the window for treatment with tPA.  No acute stroke treatment at this time.  Bedside handoff with ED RN Britta MccreedyBarbara.

## 2017-04-09 NOTE — ED Notes (Signed)
Code stroke @ 1224

## 2017-04-09 NOTE — Progress Notes (Signed)
PT Cancellation Note  Patient Details Name: Donald MainsRonnie Younan MRN: [id number] DOB: 02/16/1971   Cancelled Treatment:    Reason Eval/Treat Not Completed: PT screened, no needs identified, will sign off; RN and pt report he is at baseline, no need for PT.   Elray McgregorCynthia Wynn 04/09/2017, 3:36 PM  Sheran Lawlessyndi Wynn, [place]PT [phone] 04/09/2017

## 2017-04-10 DIAGNOSIS — I6330 Cerebral infarction due to thrombosis of unspecified cerebral artery: Secondary | ICD-10-CM

## 2017-04-10 DIAGNOSIS — Z79899 Other long term (current) drug therapy: Secondary | ICD-10-CM

## 2017-04-10 DIAGNOSIS — B20 Human immunodeficiency virus [HIV] disease: Secondary | ICD-10-CM

## 2017-04-10 DIAGNOSIS — F17200 Nicotine dependence, unspecified, uncomplicated: Secondary | ICD-10-CM

## 2017-04-10 DIAGNOSIS — R911 Solitary pulmonary nodule: Secondary | ICD-10-CM

## 2017-04-10 DIAGNOSIS — I10 Essential (primary) hypertension: Secondary | ICD-10-CM

## 2017-04-10 DIAGNOSIS — Z7902 Long term (current) use of antithrombotics/antiplatelets: Secondary | ICD-10-CM

## 2017-04-10 DIAGNOSIS — Z7982 Long term (current) use of aspirin: Secondary | ICD-10-CM

## 2017-04-10 LAB — COMPREHENSIVE METABOLIC PANEL
ALT: 12 U/L — ABNORMAL LOW (ref 17–63)
ANION GAP: 10 (ref 5–15)
AST: 15 U/L (ref 15–41)
Albumin: 3.5 g/dL (ref 3.5–5.0)
Alkaline Phosphatase: 68 U/L (ref 38–126)
BILIRUBIN TOTAL: 0.6 mg/dL (ref 0.3–1.2)
BUN: 13 mg/dL (ref 6–20)
CHLORIDE: 101 mmol/L (ref 101–111)
CO2: 26 mmol/L (ref 22–32)
Calcium: 8.7 mg/dL — ABNORMAL LOW (ref 8.9–10.3)
Creatinine, Ser: 1.37 mg/dL — ABNORMAL HIGH (ref 0.61–1.24)
GFR calc Af Amer: >60 mL/min (ref >60)
GFR calc non Af Amer: 59 mL/min — ABNORMAL LOW (ref >60)
Glucose, Bld: 123 mg/dL — ABNORMAL HIGH (ref 65–99)
POTASSIUM: 3.7 mmol/L (ref 3.5–5.1)
Sodium: 137 mmol/L (ref 135–145)
TOTAL PROTEIN: 7.2 g/dL (ref 6.5–8.1)

## 2017-04-10 LAB — PROTIME-INR
INR: 1.07
Prothrombin Time: 13.9 seconds (ref 11.4–15.2)

## 2017-04-10 LAB — CBC
HEMATOCRIT: 42.6 % (ref 39.0–52.0)
HEMOGLOBIN: 14.8 g/dL (ref 13.0–17.0)
MCH: 30.7 pg (ref 26.0–34.0)
MCHC: 34.7 g/dL (ref 30.0–36.0)
MCV: 88.4 fL (ref 78.0–100.0)
Platelets: 185 10*3/uL (ref 150–400)
RBC: 4.82 MIL/uL (ref 4.22–5.81)
RDW: 13.5 % (ref 11.5–15.5)
WBC: 6 10*3/uL (ref 4.0–10.5)

## 2017-04-10 LAB — APTT: APTT: 35 s (ref 24–36)

## 2017-04-10 MED ORDER — LISINOPRIL-HYDROCHLOROTHIAZIDE 10-12.5 MG PO TABS: 2.0000 | ORAL_TABLET | Freq: Every day | ORAL | 0 refills | Status: DC

## 2017-04-10 MED ORDER — ASPIRIN 81 MG PO TBEC: 81.0000 mg | DELAYED_RELEASE_TABLET | Freq: Every day | ORAL | 0 refills | Status: AC

## 2017-04-10 MED ORDER — ATORVASTATIN CALCIUM 80 MG PO TABS: 80.0000 mg | ORAL_TABLET | Freq: Every day | ORAL | 0 refills | Status: DC

## 2017-04-10 NOTE — Progress Notes (Signed)
STROKE TEAM PROGRESS NOTE   SUBJECTIVE (INTERVAL HISTORY) Family members present. The patient is without complaints although he is anxious for discharge as he is attending a funeral today. He feels he is essentially back to baseline. We discussed risk for future strokes and the pulmonary nodule that was found this admission which will require further follow-up. Risk factor modification was recommended including smoking cessation, hypertension control, weight loss, regular physician follow-up, antiplatelet compliance, and lipid management. The patient voiced understanding and did not have any questions.   OBJECTIVE Temp:  [97.8 F (36.6 C)-98.6 F (37 C)] 97.8 F (36.6 C) (01/26 0508) Pulse Rate:  [60-100] 60 (01/26 0508) Cardiac Rhythm: Sinus bradycardia;Bundle branch block (01/26 0700) Resp:  [16-25] 16 (01/26 0508) BP: (129-164)/(75-113) 133/81 (01/26 0508) SpO2:  [94 %-100 %] 98 % (01/26 0508) Weight:  [192 lb (87.1 kg)-197 lb 5 oz (89.5 kg)] 197 lb 5 oz (89.5 kg) (01/26 16100638)  CBC:  Recent Labs  Lab 04/09/17 1220 04/09/17 1231 04/10/17 0415  WBC 7.8  --  6.0  NEUTROABS 3.4  --   --   HGB 15.9 16.3 14.8  HCT 46.4 48.0 42.6  MCV 87.2  --  88.4  PLT 200  --  185    Basic Metabolic Panel:  Recent Labs  Lab 04/09/17 1220 04/09/17 1231 04/10/17 0415  NA 139 141 137  K 3.5 3.5 3.7  CL 103 102 101  CO2 24  --  26  GLUCOSE 88 89 123*  BUN 13 13 13   CREATININE 1.48* 1.50* 1.37*  CALCIUM 9.2  --  8.7*    Lipid Panel:     Component Value Date/Time   CHOL 234 (H) 04/09/2017 1224   TRIG 413 (H) 04/09/2017 1224   HDL 28 (L) 04/09/2017 1224   CHOLHDL 8.4 04/09/2017 1224   VLDL UNABLE TO CALCULATE IF TRIGLYCERIDE OVER 400 mg/dL 96/04/540901/25/2019 81191224   LDLCALC UNABLE TO CALCULATE IF TRIGLYCERIDE OVER 400 mg/dL 14/78/295601/25/2019 21301224   QMVH8IHgbA1c:  Lab Results  Component Value Date   HGBA1C 6.6 (H) 04/09/2017   Urine Drug Screen: No results found for: LABOPIA, COCAINSCRNUR, LABBENZ,  AMPHETMU, THCU, LABBARB  Alcohol Level No results found for: ETH  IMAGING  Ct Angio Head W Or Wo Contrast Ct Angio Neck W Or Wo Contrast  Ct Cerebral Perfusion W Contrast 04/09/2017 IMPRESSION:  1. Negative CTA of the head and neck. No infarct or penumbra by cerebral perfusion.  2. 9 mm left upper lobe pulmonary nodule. Consider one of the following in 3 months for both low-risk and high-risk individuals: (a) repeat chest CT, (b) follow-up PET-CT, or (c) tissue sampling. This recommendation follows the consensus statement: Guidelines for Management of Incidental Pulmonary Nodules Detected on CT Images: From the Fleischner Society 2017; Radiology 2017; 284:228-243. Electronically Signed      Donald Wheeler W Wo Contrast 04/09/2017 IMPRESSION:  1. Patchy small volume acute ischemic nonhemorrhagic perforator type infarct involving the posterior left basal ganglia. No associated mass effect.  2. Moderate chronic small vessel ischemic disease.    Ct Head Code Stroke Wo Contrast 04/09/2017 IMPRESSION:  1. No acute abnormality  2. ASPECTS is 10  3. Moderate white matter changes with progression since 2015. This may be related to the patient's HIV infection or chronic microvascular ischemia.    Transthoracic Echocardiogram  04/09/2017 Study Conclusions - Left ventricle: The cavity size was normal. There was mild   concentric hypertrophy. Systolic function was normal. The   estimated ejection fraction  was in the range of 55% to 60%. Wall   motion was normal; there were no regional wall motion   abnormalities. Doppler parameters are consistent with abnormal   left ventricular relaxation (grade 1 diastolic dysfunction).    PHYSICAL EXAM Vitals:   04/10/17 0000 04/10/17 0151 04/10/17 0508 04/10/17 0638  BP: (!) 137/93 129/75 133/81   Pulse: 71 62 60   Resp: 18 16 16    Temp: 98.2 F (36.8 C) 97.8 F (36.6 C) 97.8 F (36.6 C)   TempSrc: Oral Oral Oral   SpO2: 98% 98% 98%   Weight:     197 lb 5 oz (89.5 kg)  Height:        General - pleasant 47 year old male in bed in no acute distress Heart - Regular rate and rhythm - no murmer appreciated Lungs - Clear to auscultation Abdomen - Soft - non tender Extremities - No edema Skin - Warm and dry  Neurologic Examination:  Mental Status:  Alert, oriented, thought content appropriate. Speech without evidence of dysarthria or aphasia. Able to follow 3 step commands without difficulty.  Cranial Nerves:  II-bilateral visual fields intact III/IV/VI-Pupils were equal and reacted. Extraocular movements were full.  V/VII-no facial numbness and no facial weakness.  VIII-hearing normal.  X-normal speech and symmetrical palatal movement.  XII-midline tongue extension  Motor: 5/5 strength symmetrical throughout.  Muscle tone normal throughout. Sensory: Intact to light touch in all extremities. Deep Tendon Reflexes: 2/4 throughout Plantars: Downgoing bilaterally  Cerebellar: Normal finger to nose and heel to shin bilaterally. Gait: not tested   ASSESSMENT/PLAN Donald. Donald Wheeler is a 47 y.o. male with history of tobacco use, marijuana use, HIV, and hypertension, presenting with right-sided weakness. He did not receive IV t-PA due to late presentation and minimal deficits.  Stroke:  Small infarct posterior left basal ganglia - small vessel vs embolic. May be hypercoagulable from occult malignancy.  Resultant - right-sided weakness significantly improved  CT head - No acute abnormality   MRI head - Patchy small volume acute ischemic nonhemorrhagic perforator type infarct involving the posterior left basal ganglia  MRA head - not performed  Carotid Doppler - CTA neck  2D Echo - EF 55-60%. No cardiac source of emboli identified.  LDL - unable to calculate. Triglycerides greater than 400.  HgbA1c - 6.6  VTE prophylaxis - Lovenox Diet regular Room service appropriate? Yes; Fluid consistency: Thin  No antithrombotic prior  to admission, now on aspirin 81 mg daily  Patient counseled to be compliant with his antithrombotic medications  Ongoing aggressive stroke risk factor management  Therapy recommendations:  No follow-up therapies recommended.  Disposition:  Pending  Hypertension  Stable  Permissive hypertension (OK if < 220/120) but gradually normalize in 5-7 days  Long-term BP goal normotensive  Hyperlipidemia  Home meds:  No lipid lowering medications prior to admission  LDL - unable to calculate ; goal less than 70  Now on Lipitor 80 mg daily  Continue statin at discharge  Diabetes  HgbA1c 6.6, goal < 7.0  Prediabetes  Other Stroke Risk Factors  Cigarette smoker - advised to stop smoking  ETOH use, advised to drink no more than 1 drink per day.  Obesity, Body mass index is 30.9 kg/m., recommend weight loss, diet and exercise as appropriate   Other Active Problems  HIV  9 mm left upper lobe pulmonary nodule.  Elevated creatinine - improving - 1.37  UDS - not performed   Plan / Recommendations - discussed with  Dr. Roda Shutters  Stroke workup complete  Follow-up Dr. Pearlean Brownie in 6 weeks  Follow-up for pulmonary nodule in 3 months as recommended per radiology.  Continue aspirin 81 mg daily and Lipitor 80 mg daily.   Hospital day # 0  Delton See [facility] Pager [phone] 04/10/2017, 10:37 AM  ATTENDING NOTE: I reviewed above note and agree with the assessment and plan. I have made any additions or clarifications directly to the above note.  47 year old male with history of hypertension, HIV, smoker presented with right side tingling and weakness.  CT no acute infarct.  CTA head and neck and CT perfusion unremarkable.  MRI showed left BG/CR infarct.  EF 55-60%.  LDL not able to calculate due to high TG.  A1c 6.6.  Put on aspirin 81 and Lipitor 80.  Patient educated on smoking cessation.  Discharged in good condition.  Follow-up with stroke clinic in 6  weeks.  Neurology will sign off. Please call with questions. Thanks for the consult.   Marvel Plan, MD PhD Stroke Neurology 04/10/2017 11:20 PM  To contact Stroke Continuity provider, please refer to [email or web address]. After hours, contact General Neurology

## 2017-04-10 NOTE — Discharge Summary (Signed)
Name: Donald Wheeler MRN: [id number] DOB: [birth year] 47 y.o. PCP: Patient, No Pcp Per  Date of Admission: 04/09/2017 12:22 PM Date of Discharge: 1/26/20191/26/19 Attending Physician: Dr. Erlinda Hong  Discharge Diagnosis:  Basal Ganglia Ischemic Infarct Cerebral thrombosis with cerebral infarction Solitary pulmonary nodule  Discharge Medications: Allergies as of 04/10/2017   No Known Allergies     Medication List    TAKE these medications   aspirin 81 MG EC tablet Take 1 tablet (81 mg total) by mouth daily. Start taking on:  04/11/2017   atorvastatin 80 MG tablet Commonly known as:  LIPITOR Take 1 tablet (80 mg total) by mouth daily at 6 PM.   elvitegravir-cobicistat-emtricitabine-tenofovir 150-150-200-10 MG Tabs tablet Commonly known as:  GENVOYA Take 1 tablet by mouth daily with breakfast.   lisinopril-hydrochlorothiazide 10-12.5 MG tablet Commonly known as:  PRINZIDE,ZESTORETIC Take 2 tablets by mouth daily. Start taking on:  04/11/2017 What changed:  how much to take       Disposition and follow-up:   Mr.Donald Wheeler was discharged from [facility] in stable condition.  At the hospital follow up visit please address:  1.  Basal ganglia infarct: Please ensure that the patient is taking atorvastatin, aspirin, Prinzide daily.  Please ensure that the patient follows up with neurologist.  Pulmonary nodules: The patient should receive a CT T chest or PET/CT or tissue sampling in 3 months to determine whether the incidental finding of a 9 mm pulmonary nodule is low or high risk.  2.  Labs / imaging needed at time of follow-up: CT chest in 3 months  3.  Pending labs/ test needing follow-up: none  Follow-up Appointments: Follow-up Information    [facility]. Schedule an appointment as soon as possible for a visit in 1 week(s).   Why:  [facility] will call you on Monday to schedule your  follow-up appointment. If you do not hear from them by that evening, please give the clinic a call and schedule.  Contact information: [address] [place] [address] [phone]       Donald Smart, MD Follow up.   Specialty:  Infectious Diseases Contact information: [address] [place] [address] [phone]        Donald Riley, MD Follow up in 6 week(s).   Specialties:  Neurology, Radiology Why:  Office should contact you. Contact information: [address] [place] [address] [phone]           Hospital Course by problem list:   Basal ganglia ischemic infarct [name] presented with right upper and lower extremity numbness and tingling that lasted a few hours on the day of admission.  The patient had a CT scan that was negative for any acute intracranial abnormalities.  MRI was done which showed a small acute ischemic nonhemorrhagic infarct in the posterior left basal ganglia.  Echocardiogram was consistent with preserved ejection fraction and no wall motion abnormalities.  The patient's blood pressure medications were held and massive hypertension was allowed.  Patient's blood pressure medication was increased to lisinopril-hydrochlorthiazide 20-25 mg daily.  Aspirin 81 mg atorvastatin were started.  The patient was not started on any diabetes medication  was first commended to make lifestyle modifications and diet and exercise.  Pulmonary Nodules A 9mm left upper lobe pulmonary nodule was incidentally noted on CTA head and neck. This should be followe dup with repeat chest CT  in 3 months.   Discharge Vitals:   BP (!) 151/98 (BP Location: Left Arm)   Pulse 64   Temp 97.9 F (36.6 C) (Oral)   Resp 16   Ht 5\' 7"  (1.702 m)   Wt 197 lb 5 oz (89.5 kg)   SpO2 99%   BMI 30.90 kg/m   Pertinent Labs, Studies, and Procedures:  BMP Latest Ref Rng & Units 04/10/2017 04/09/2017 04/09/2017  Glucose 65 - 99 mg/dL  161(W) 89 88  BUN 6 - 20 mg/dL 13 13 13   Creatinine 0.61 - 1.24 mg/dL 9.60(A) 5.40(J) 8.11(B)  BUN/Creat Ratio 6 - 22 (calc) - - -  Sodium 135 - 145 mmol/L 137 141 139  Potassium 3.5 - 5.1 mmol/L 3.7 3.5 3.5  Chloride 101 - 111 mmol/L 101 102 103  CO2 22 - 32 mmol/L 26 - 24  Calcium 8.9 - 10.3 mg/dL 1.4(N) - 9.2   CBC    Component Value Date/Time   WBC 6.0 04/10/2017 0415   RBC 4.82 04/10/2017 0415   HGB 14.8 04/10/2017 0415   HCT 42.6 04/10/2017 0415   PLT 185 04/10/2017 0415   MCV 88.4 04/10/2017 0415   MCH 30.7 04/10/2017 0415   MCHC 34.7 04/10/2017 0415   RDW 13.5 04/10/2017 0415   LYMPHSABS 3.5 04/09/2017 1220   MONOABS 0.8 04/09/2017 1220   EOSABS 0.1 04/09/2017 1220   BASOSABS 0.0 04/09/2017 1220    CT Head code stroke (04/10/17): 1. No acute abnormality 2. ASPECTS is 10 3. Moderate white matter changes with progression since 2015. This may be related to the patient's HIV infection or chronic microvascular ischemia. 4. These results were called by telephone at the time of interpretation on 04/09/2017 at 12:45 pm to Dr. Charlesetta Garibaldi, who verbally acknowledged these results.  CT Angio Head w or wo contrast (04/09/17): 1. Negative CTA of the head and neck. No infarct or penumbra by cerebral perfusion. 2. 9 mm left upper lobe pulmonary nodule. Consider one of the following in 3 months for both low-risk and high-risk individuals: (a) repeat chest CT, (b) follow-up PET-CT, or (c) tissue sampling.  CT Cerebral perfusion w contrast (04/09/17): 1. Negative CTA of the head and neck. No infarct or penumbra by cerebral perfusion. 2. 9 mm left upper lobe pulmonary nodule. Consider one of the following in 3 months for both low-risk and high-risk individuals: (a) repeat chest CT, (b) follow-up PET-CT, or (c) tissue sampling.  MRI brain: 1. Patchy small volume acute ischemic nonhemorrhagic perforator type infarct involving the posterior left basal ganglia. No associated mass effect. 2.  Moderate chronic small vessel ischemic disease.  Echo (04/09/2017): LVEF 55-60%, grade 1 diastolic dysfunction, no regional wall motion abnormalities  Lipid panel: cholesterol = 234, triglycerides = 413, HDL = 28  A1c = 6.6   Discharge Instructions: Discharge Instructions    Ambulatory referral to Neurology   Complete by:  As directed    An appointment is requested in approximately: 6 weeks   Call MD for:  difficulty breathing, headache or visual disturbances   Complete by:  As directed    Call MD for:  extreme fatigue   Complete by:  As directed    Call MD for:  persistant dizziness or light-headedness   Complete by:  As directed    Call MD for:  persistant nausea and vomiting   Complete by:  As directed    Diet - low sodium heart healthy   Complete by:  As directed  Increase activity slowly   Complete by:  As directed       Signed: Lorenso Courierhundi, Geryl Dohn, MD 04/10/2017, 12:15 PM   Pager: [phone]

## 2017-04-10 NOTE — Progress Notes (Signed)
   Subjective:  Donald Wheeler was seen resting in his bed comfrotably this morning. He denies any headaches, weakness, numbness, or tingling this morning.  Objective:  Vital signs in last 24 hours: Vitals:   04/09/17 2024 04/10/17 0000 04/10/17 0151 04/10/17 0508  BP: (!) 151/93 (!) 137/93 129/75 133/81  Pulse: 85 71 62 60  Resp: 18 18 16 16   Temp: 98.2 F (36.8 C) 98.2 F (36.8 C) 97.8 F (36.6 C) 97.8 F (36.6 C)  TempSrc: Oral Oral Oral Oral  SpO2: 97% 98% 98% 98%  Weight:      Height:       Constitutional: He is oriented to person, place, and time. He appears well-developed and well-nourished. No distress.  HENT:  Head: Normocephalic and atraumatic.  Eyes: Conjunctivae are normal.  Cardiovascular: Normal rate, regular rhythm and normal heart sounds. No murmur heard. Respiratory: Effort normal and breath sounds normal. No respiratory distress. He has no wheezes.  GI: Soft. He exhibits no distension. There is no tenderness.  Musculoskeletal: He exhibits no edema.  Neurological: He is alert and oriented to person, place, and time. No cranial nerve deficit.  5/5 bilateral upper and lower extremity strength, no sensory deficits Skin: He is not diaphoretic.  Psychiatric: He has a normal mood and affect. His behavior is normal. Judgment and thought content normal.   Assessment/Plan:  47 y.o person with hiv and essential hypertension presented with new onset right upper and lower extremity weakness and tingling.   Basal ganglia ischemic infarct  The patient presented with few hours of right upper and lower extremity weakness. MRI returned showing a small volume of ischemic infarct present in the posterior left basal ganglia.  The stroke is likely secondary to to uncontrolled hypertension.  He also has other risk factors such as hyperlipidemia, new onset diabetes, HIV, and family history.  Echo (04/09/2017): LVEF 55-60%, grade 1 diastolic dysfunction, no regional wall motion  abnormalities  No irregular rhythm or rates were found on cardiac monitoring.  Lipid panel consistent with hyperlipidemia-cholesterol = 234, triglycerides = 413, HDL = 28  Patient's blood pressure since admission has ranged 129-164/75-107.  A1c = 6.6 which is places the patient in diabetic range.  Due to the A1c being relatively low in the patient being hesitant to start on new diabetic medication he was recommended to make lifestyle modifications.  Extensive counseling was done on diet and exercise.  -Allow permissive hypertension through today 1/26 -Continue aspirin 81 mg -Continue atorvastatin 80 mg   HIV The patient's HIV can contribute to his stroke.  Encourage patient to continue his Genvoya and follow with infectious diseases.  Essential Hypertension The patient's blood pressure since admission has ranged 129-164/75-107. On lisinopril-hctz 10-12.5mg  daily.   -Allowing permissive hypertension through today 04/10/17   Dispo: Anticipated discharge in approximately Today.   Lorenso CourierVahini Bridney Guadarrama, MD Internal Medicine PGY1 Pager:[phone] 04/10/2017, 10:04 AM

## 2017-04-10 NOTE — Discharge Instructions (Signed)
Take care of you [name].  During this hospitalization you were treated for a small stroke.  Please make sure to do the following:  -Please quit smoking -Please take atorvastatin 80 mg daily -Please exercise at least 30 minutes daily and eat healthy foods that are low in fat content and sugar content -Your blood pressure medication has been changed.  Please start taking the increased dose on 04/11/2017 -Please take aspirin 81 mg daily -Please follow-up with your new primary care physician next week -Please follow-up with neurologist    Stroke Prevention Some health problems and behaviors may make it more likely for you to have a stroke. Below are ways to lessen your risk of having a stroke.  Be active for at least 30 minutes on most or all days.  Do not smoke. Try not to be around others who smoke.  Do not drink too much alcohol. ? Do not have more than 2 drinks a day if you are a man. ? Do not have more than 1 drink a day if you are a woman and are not pregnant.  Eat healthy foods, such as fruits and vegetables. If you were put on a specific diet, follow the diet as told.  Keep your cholesterol levels under control through diet and medicines. Look for foods that are low in saturated fat, trans fat, cholesterol, and are high in fiber.  If you have diabetes, follow all diet plans and take your medicine as told.  Ask your doctor if you need treatment to lower your blood pressure. If you have high blood pressure (hypertension), follow all diet plans and take your medicine as told by your doctor.  If you are 2518-488 years old, have your blood pressure checked every 3-5 years. If you are age 49 or older, have your blood pressure checked every year.  Keep a healthy weight. Eat foods that are low in calories, salt, saturated fat, trans fat, and cholesterol.  Do not take drugs.  Avoid birth control pills, if this applies. Talk to your doctor about the risks of taking birth control  pills.  Talk to your doctor if you have sleep problems (sleep apnea).  Take all medicine as told by your doctor. ? You may be told to take aspirin or blood thinner medicine. Take this medicine as told by your doctor. ? Understand your medicine instructions.  Make sure any other conditions you have are being taken care of.  Get help right away if:  You suddenly lose feeling (you feel numb) or have weakness in your face, arm, or leg.  Your face or eyelid hangs down to one side.  You suddenly feel confused.  You have trouble talking (aphasia) or understanding what people are saying.  You suddenly have trouble seeing in one or both eyes.  You suddenly have trouble walking.  You are dizzy.  You lose your balance or your movements are clumsy (uncoordinated).  You suddenly have a very bad headache and you do not know the cause.  You have new chest pain.  Your heart feels like it is fluttering or skipping a beat (irregular heartbeat). Do not wait to see if the symptoms above go away. Get help right away. Call your local emergency services (911 in [place] Do not drive yourself to the hospital. This information is not intended to replace advice given to you by your health care provider. Make sure you discuss any questions you have with your health care provider. Document Released: 09/01/2011 Document  Revised: 08/08/2015 Document Reviewed: 09/02/2012 Elsevier Interactive Patient Education  [employer].

## 2017-04-10 NOTE — Progress Notes (Signed)
Patient discharge home. Discharge instructions were reviewed with the patient. Patient verbalized understanding.  

## 2017-04-13 ENCOUNTER — Encounter: Payer: Self-pay | Admitting: Internal Medicine

## 2017-04-13 ENCOUNTER — Ambulatory Visit (INDEPENDENT_AMBULATORY_CARE_PROVIDER_SITE_OTHER): Payer: Self-pay | Admitting: Internal Medicine

## 2017-04-13 VITALS — BP 146/98 | HR 79 | Temp 98.2°F | Ht 67.0 in | Wt 202.0 lb

## 2017-04-13 DIAGNOSIS — Z716 Tobacco abuse counseling: Secondary | ICD-10-CM

## 2017-04-13 DIAGNOSIS — I639 Cerebral infarction, unspecified: Secondary | ICD-10-CM

## 2017-04-13 DIAGNOSIS — I6381 Other cerebral infarction due to occlusion or stenosis of small artery: Secondary | ICD-10-CM

## 2017-04-13 DIAGNOSIS — B20 Human immunodeficiency virus [HIV] disease: Secondary | ICD-10-CM

## 2017-04-13 MED ORDER — BICTEGRAVIR-EMTRICITAB-TENOFOV 50-200-25 MG PO TABS: 1.0000 | ORAL_TABLET | Freq: Every day | ORAL | 11 refills | Status: DC

## 2017-04-13 NOTE — Patient Instructions (Signed)
We will see you back in 4 wk for a lab visit, then in 2 wk with me.

## 2017-04-13 NOTE — Progress Notes (Signed)
RFV: follow for hiv disease  Patient ID: Donald MainsRonnie Wheeler, male   DOB: 05/21/1970, 47 y.o.   MRN: [id number]  HPI Donald BameRonnie is a 47yo M hiv disease, CD 4 count of 370/VL 66. On genvoya. Who was just discharged 2 days ago from basal ganglia stroke, presented with transient right arm weakness. Started on aspirin plus high dose atorvastatin. Working with Licensed conveyancerMitch for bridge counseling. Doing better, does not have much weakness associated with stroke.  Outpatient Encounter Medications as of 04/13/2017  Medication Sig  . aspirin EC 81 MG EC tablet Take 1 tablet (81 mg total) by mouth daily.  Marland Kitchen. atorvastatin (LIPITOR) 80 MG tablet Take 1 tablet (80 mg total) by mouth daily at 6 PM.  . elvitegravir-cobicistat-emtricitabine-tenofovir (GENVOYA) 150-150-200-10 MG TABS tablet Take 1 tablet by mouth daily with breakfast.  . lisinopril-hydrochlorothiazide (PRINZIDE,ZESTORETIC) 10-12.5 MG tablet Take 2 tablets by mouth daily.  . hydrochlorothiazide (HYDRODIURIL) 25 MG tablet TK 1 T PO  QD   No facility-administered encounter medications on file as of 04/13/2017.      Patient Active Problem List   Diagnosis Date Noted  . 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 Solitary pulmonary nodule 04/10/2017  . Focal neurological deficit 04/09/2017  . CKD (chronic kidney disease) stage 3, GFR 30-59 ml/min (HCC) 04/09/2017  . Tobacco use 04/09/2017  . HTN (hypertension), benign 09/07/2013  . Bell's palsy 09/07/2013  . Hypertriglyceridemia 09/07/2013  . HIV (human immunodeficiency virus infection) (HCC) 03/17/2012     Health Maintenance Due  Topic Date Due  . INFLUENZA VACCINE  10/14/2016    Social History   Tobacco Use  . Smoking status: Current Every Day Smoker    Packs/day: 0.20    Years: 15.00    Pack years: 3.00    Types: Cigars  . Smokeless tobacco: Never Used  . Tobacco comment: black and milds   Substance Use Topics  . Alcohol use: Yes    Comment: 04/09/2017 "might have 2  beers/month; if that"  . Drug use: Yes    Types: Marijuana    Comment: 04/09/2017 "maybe 10 times/year"   Review of Systems  Constitutional: Negative for fever, chills, diaphoresis, activity change, appetite change, fatigue and unexpected weight change.  HENT: Negative for congestion, sore throat, rhinorrhea, sneezing, trouble swallowing and sinus pressure.  Eyes: Negative for photophobia and visual disturbance.  Respiratory: Negative for cough, chest tightness, shortness of breath, wheezing and stridor.  Cardiovascular: Negative for chest pain, palpitations and leg swelling.  Gastrointestinal: Negative for nausea, vomiting, abdominal pain, diarrhea, constipation, blood in stool, abdominal distention and anal bleeding.  Genitourinary: Negative for dysuria, hematuria, flank pain and difficulty urinating.  Musculoskeletal: Negative for myalgias, back pain, joint swelling, arthralgias and gait problem.  Skin: Negative for color change, pallor, rash and wound.  Neurological: Negative for dizziness, tremors, weakness and light-headedness.  Hematological: Negative for adenopathy. Does not bruise/bleed easily.  Psychiatric/Behavioral: Negative for behavioral problems, confusion, sleep disturbance, dysphoric mood, decreased concentration and agitation.   Family hx = + stroke, and +CAD  Physical Exam   BP (!) 146/98   Pulse 79   Temp 98.2 F (36.8 C) (Oral)   Ht 5\' 7"  (1.702 m)   Wt 202 lb (91.6 kg)   BMI 31.64 kg/m    Constitutional: He is oriented to person, place, and time. He appears well-developed and well-nourished. No distress.  HENT:  Mouth/Throat: Oropharynx is clear and moist. No oropharyngeal exudate.  Cardiovascular: Normal rate, regular rhythm and normal heart  sounds. Exam reveals no gallop and no friction rub.  No murmur heard.  Pulmonary/Chest: Effort normal and breath sounds normal. No respiratory distress. He has no wheezes.  Abdominal: Soft. Bowel sounds are normal. He  exhibits no distension. There is no tenderness.  Lymphadenopathy:  He has no cervical adenopathy.  Neurological: He is alert and oriented to person, place, and time.  Skin: Skin is warm and dry. No rash noted. No erythema.  Psychiatric: He has a normal mood and affect. His behavior is normal.    Lab Results  Component Value Date   CD4TCELL 11 (L) 02/23/2017   Lab Results  Component Value Date   CD4TABS 370 (L) 02/23/2017   CD4TABS 260 (L) 04/27/2016   CD4TABS 230 (L) 04/12/2014   Lab Results  Component Value Date   HIV1RNAQUANT 66 (H) 02/23/2017   Lab Results  Component Value Date   HEPBSAB NONREACTIVE 03/03/2012   Lab Results  Component Value Date   LABRPR NON-REACTIVE 02/23/2017    CBC Lab Results  Component Value Date   WBC 6.0 04/10/2017   RBC 4.82 04/10/2017   HGB 14.8 04/10/2017   HCT 42.6 04/10/2017   PLT 185 04/10/2017   MCV 88.4 04/10/2017   MCH 30.7 04/10/2017   MCHC 34.7 04/10/2017   RDW 13.5 04/10/2017   LYMPHSABS 3.5 04/09/2017   MONOABS 0.8 04/09/2017   EOSABS 0.1 04/09/2017    BMET Lab Results  Component Value Date   NA 137 04/10/2017   K 3.7 04/10/2017   CL 101 04/10/2017   CO2 26 04/10/2017   GLUCOSE 123 (H) 04/10/2017   BUN 13 04/10/2017   CREATININE 1.37 (H) 04/10/2017   CALCIUM 8.7 (L) 04/10/2017   GFRNONAA 59 (L) 04/10/2017   GFRAA >60 04/10/2017      Assessment and Plan  HIV disease - we will change to biktarvy instead of genvoya to minimize drug side effect with high dose atorvastatin for stroke  Stroke = continue on high dose atorvastatin and aspirin.  Smoking cessation = spent 5 min on smoking cessation counseling especially with link to stroke and recurrence risk  - repeat labs in 4 wk  - have him be seen in clinic in 6wk

## 2017-04-15 ENCOUNTER — Ambulatory Visit: Payer: Self-pay

## 2017-04-16 ENCOUNTER — Other Ambulatory Visit: Payer: Self-pay | Admitting: Pharmacist

## 2017-05-07 ENCOUNTER — Other Ambulatory Visit: Payer: Self-pay | Admitting: Internal Medicine

## 2017-05-07 ENCOUNTER — Encounter: Payer: Self-pay | Admitting: Internal Medicine

## 2017-05-07 DIAGNOSIS — I10 Essential (primary) hypertension: Secondary | ICD-10-CM

## 2017-05-11 ENCOUNTER — Other Ambulatory Visit: Payer: Self-pay

## 2017-05-17 ENCOUNTER — Telehealth: Payer: Self-pay

## 2017-05-17 ENCOUNTER — Other Ambulatory Visit: Payer: Self-pay | Admitting: Internal Medicine

## 2017-05-17 ENCOUNTER — Other Ambulatory Visit: Payer: Self-pay

## 2017-05-17 DIAGNOSIS — B20 Human immunodeficiency virus [HIV] disease: Secondary | ICD-10-CM

## 2017-05-17 MED ORDER — ASPIRIN EC 81 MG PO TBEC: 81.0000 mg | DELAYED_RELEASE_TABLET | Freq: Every day | ORAL | 3 refills | Status: DC

## 2017-05-17 NOTE — Telephone Encounter (Signed)
Patient is calling with complaint of he is waiting at the pharmacy for asprin refill he was told would be called to pharmacy today.  He takes this for his stroke history.   I explained to patient asprin is over the counter and does not require a prescription.  Advised he should buy it while he is in the pharmacy and is not paid by [facility]. Medical records indicate he is taking asprin but we never prescribed medication.    Patient became frustrated  since I was asking questions to make sure it was Asprin he was requesting and hung up.     It seems he asked a non clinical staff member  in our hallway for a refill of aspirin to be sent to pharmacy  while he was here for labs.    Laurell Josephsammy K King, RN

## 2017-05-17 NOTE — Progress Notes (Signed)
Patient requesting prescription for otc asa 81mg . We have refilled it for him but mentioned it is otc.

## 2017-05-18 LAB — CBC WITH DIFFERENTIAL/PLATELET
BASOS ABS: 31 {cells}/uL (ref 0–200)
Basophils Relative: 0.4 %
EOS ABS: 94 {cells}/uL (ref 15–500)
EOS PCT: 1.2 %
HCT: 43.4 % (ref 38.5–50.0)
Hemoglobin: 15.3 g/dL (ref 13.2–17.1)
Lymphs Abs: 3838 cells/uL (ref 850–3900)
MCH: 30.4 pg (ref 27.0–33.0)
MCHC: 35.3 g/dL (ref 32.0–36.0)
MCV: 86.3 fL (ref 80.0–100.0)
MONOS PCT: 8 %
MPV: 11.9 fL (ref 7.5–12.5)
NEUTROS ABS: 3214 {cells}/uL (ref 1500–7800)
Neutrophils Relative %: 41.2 %
PLATELETS: 174 10*3/uL (ref 140–400)
RBC: 5.03 10*6/uL (ref 4.20–5.80)
RDW: 13.7 % (ref 11.0–15.0)
TOTAL LYMPHOCYTE: 49.2 %
WBC mixed population: 624 cells/uL (ref 200–950)
WBC: 7.8 10*3/uL (ref 3.8–10.8)

## 2017-05-18 LAB — BASIC METABOLIC PANEL
BUN/Creatinine Ratio: 8 (calc) (ref 6–22)
BUN: 13 mg/dL (ref 7–25)
CALCIUM: 9.2 mg/dL (ref 8.6–10.3)
CHLORIDE: 104 mmol/L (ref 98–110)
CO2: 29 mmol/L (ref 20–32)
Creat: 1.65 mg/dL — ABNORMAL HIGH (ref 0.60–1.35)
Glucose, Bld: 157 mg/dL — ABNORMAL HIGH (ref 65–99)
POTASSIUM: 3.9 mmol/L (ref 3.5–5.3)
SODIUM: 140 mmol/L (ref 135–146)

## 2017-05-18 LAB — T-HELPER CELL (CD4) - (RCID CLINIC ONLY)
CD4 % Helper T Cell: 11 % — ABNORMAL LOW (ref 33–55)
CD4 T Cell Abs: 430 /uL (ref 400–2700)

## 2017-05-19 LAB — HIV-1 RNA QUANT-NO REFLEX-BLD
HIV 1 RNA QUANT: 99 {copies}/mL — AB
HIV-1 RNA Quant, Log: 2 Log copies/mL — ABNORMAL HIGH

## 2017-05-25 ENCOUNTER — Ambulatory Visit: Payer: Self-pay | Admitting: Internal Medicine

## 2017-06-01 ENCOUNTER — Ambulatory Visit: Payer: Self-pay | Admitting: Internal Medicine

## 2017-06-02 ENCOUNTER — Ambulatory Visit: Payer: Self-pay | Admitting: Neurology

## 2017-06-04 ENCOUNTER — Other Ambulatory Visit: Payer: Self-pay | Admitting: Internal Medicine

## 2017-06-04 DIAGNOSIS — I10 Essential (primary) hypertension: Secondary | ICD-10-CM

## 2017-06-16 ENCOUNTER — Encounter: Payer: Self-pay | Admitting: Neurology

## 2017-06-16 ENCOUNTER — Ambulatory Visit: Payer: Self-pay | Admitting: Neurology

## 2017-06-16 VITALS — BP 164/102 | HR 83 | Ht 67.0 in | Wt 208.4 lb

## 2017-06-16 DIAGNOSIS — E782 Mixed hyperlipidemia: Secondary | ICD-10-CM | POA: Insufficient documentation

## 2017-06-16 DIAGNOSIS — B20 Human immunodeficiency virus [HIV] disease: Secondary | ICD-10-CM

## 2017-06-16 DIAGNOSIS — I6330 Cerebral infarction due to thrombosis of unspecified cerebral artery: Secondary | ICD-10-CM

## 2017-06-16 DIAGNOSIS — I10 Essential (primary) hypertension: Secondary | ICD-10-CM

## 2017-06-16 DIAGNOSIS — F17200 Nicotine dependence, unspecified, uncomplicated: Secondary | ICD-10-CM

## 2017-06-16 NOTE — Patient Instructions (Addendum)
-   continue ASA and lipitor for stroke prevention - check BP at home and record - take BP meds 2 tablets a day as instructed.  - Follow up with your primary care physician for stroke risk factor modification. Recommend maintain blood pressure goal <130/80, diabetes with hemoglobin A1c goal below 7.0% and lipids with LDL cholesterol goal below 70 mg/dL.  - quit smoking - follow up with ID  - healthy diet and regular exercise - follow up as needed.

## 2017-06-16 NOTE — Progress Notes (Signed)
STROKE NEUROLOGY FOLLOW UP NOTE  NAME: Lita MainsRonnie Reicher DOB: 09/02/1970  REASON FOR VISIT: stroke follow up HISTORY FROM: PT AND CHART   Today we had the pleasure of seeing Lita MainsRonnie Burchfield in follow-up at our Neurology Clinic. Pt was accompanied by no one.   History Summary 47 year old male with history of hypertension, HIV, smoker admitted on 04/09/17 for right side tingling and weakness.  CT no acute infarct.  CTA head and neck and CT perfusion unremarkable.  MRI showed left BG/CR infarct.  EF 55-60%.  LDL not able to calculate due to high TG.  A1c 6.6.  Put on aspirin 81 and Lipitor 80.  Patient educated on smoking cessation.  Discharged in good condition.  Interval History During the interval time, the patient has been doing well. No residue deficit. Still smoking, not quit yet. Follows with Dr. Drue SecondSnider in ID clinic. BP still high today in clinic 164/102. However, he only takes 1 tab on BP meds instead of 2 tabs as instructed. He does not do much exercise at home. He has not back to work yet. He is asking for a letter to confirm his stroke for his lawyer.     REVIEW OF SYSTEMS: Full 14 system review of systems performed and notable only for those listed below and in HPI above, all others are negative:  Constitutional:  Appetite change, chills, unexpected weight change, excessive sweating Cardiovascular: chest pain Ear/Nose/Throat:  Ringing in ears Skin:  Eyes:  Eye redness, blurred vision Respiratory:   Gastroitestinal:  Abdominal pain, rectal pain Genitourinary:  Hematology/Lymphatic:   Endocrine: cold intolerance Musculoskeletal:  Back pain, neck pain, neck stiffness Allergy/Immunology:   Neurological:  HA, speech difficulty Psychiatric: confusion, depression Sleep: restless leg, daytime sleepiness, snoring  The following represents the patient's updated allergies and side effects list: No Known Allergies  The neurologically relevant items on the patient's problem list were  reviewed on today's visit.  Neurologic Examination  A problem focused neurological exam (12 or more points of the single system neurologic examination, vital signs counts as 1 point, cranial nerves count for 8 points) was performed.  Blood pressure (!) 164/102, pulse 83, height 5\' 7"  (1.702 m), weight 208 lb 6.4 oz (94.5 kg).  General - Well nourished, well developed, in no apparent distress.  Ophthalmologic - Fundi not visualized due to eye movement.  Cardiovascular - Regular rate and rhythm.  Mental Status -  Level of arousal and orientation to time, place, and person were intact. Language including expression, naming, repetition, comprehension was assessed and found intact. Fund of Knowledge was assessed and was intact.  Cranial Nerves II - XII - II - Visual field intact OU. III, IV, VI - Extraocular movements intact. V - Facial sensation intact bilaterally. VII - Facial movement intact bilaterally. VIII - Hearing & vestibular intact bilaterally. X - Palate elevates symmetrically. XI - Chin turning & shoulder shrug intact bilaterally. XII - Tongue protrusion intact.  Motor Strength - The patient's strength was normal in all extremities and pronator drift was absent.  Bulk was normal and fasciculations were absent.   Motor Tone - Muscle tone was assessed at the neck and appendages and was normal.  Reflexes - The patient's reflexes were 1+ in all extremities and he had no pathological reflexes.  Sensory - Light touch, temperature/pinprick, vibration and proprioception, and Romberg testing were assessed and were normal.    Coordination - The patient had normal movements in the hands and feet with no ataxia or dysmetria.  Tremor was absent.  Gait and Station - The patient's transfers, posture, gait, station, and turns were observed as normal.   Functional score  mRS = 0  NIH Stroke Scale = 0   Data reviewed: I personally reviewed the images and agree with the radiology  interpretations.  Ct Angio Head W Or Wo Contrast Ct Angio Neck W Or Wo Contrast  Ct Cerebral Perfusion W Contrast 04/09/2017 IMPRESSION:  1. Negative CTA of the head and neck. No infarct or penumbra by cerebral perfusion.  2. 9 mm left upper lobe pulmonary nodule. Consider one of the following in 3 months for both low-risk and high-risk individuals: (a) repeat chest CT, (b) follow-up PET-CT, or (c) tissue sampling. This recommendation follows the consensus statement: Guidelines for Management of Incidental Pulmonary Nodules Detected on CT Images: From the Fleischner Society 2017; Radiology 2017; 284:228-243. Electronically Signed      Mr Laqueta Jean Wo Contrast 04/09/2017 IMPRESSION:  1. Patchy small volume acute ischemic nonhemorrhagic perforator type infarct involving the posterior left basal ganglia. No associated mass effect.  2. Moderate chronic small vessel ischemic disease.    Ct Head Code Stroke Wo Contrast 04/09/2017 IMPRESSION:  1. No acute abnormality  2. ASPECTS is 10  3. Moderate white matter changes with progression since 2015. This may be related to the patient's HIV infection or chronic microvascular ischemia.    Transthoracic Echocardiogram  04/09/2017 Study Conclusions - Left ventricle: The cavity size was normal. There was mild concentric hypertrophy. Systolic function was normal. The estimated ejection fraction was in the range of 55% to 60%. Wall motion was normal; there were no regional wall motion abnormalities. Doppler parameters are consistent with abnormal left ventricular relaxation (grade 1 diastolic dysfunction).  Component     Latest Ref Rng & Units 02/23/2017  Cholesterol     0 - 200 mg/dL 161 (H)  HDL Cholesterol     >40 mg/dL 30 (L)  Triglycerides     <150 mg/dL 096 (H)  LDL Cholesterol (Calc)     mg/dL (calc)   Total CHOL/HDL Ratio     RATIO 7.4 (H)  Non-HDL Cholesterol (Calc)     <130 mg/dL (calc) 045 (H)  VLDL     0 - 40  mg/dL   LDL (calc)     0 - 99 mg/dL   Hemoglobin W0J     4.8 - 5.6 %   Mean Plasma Glucose     mg/dL   RPR     NON-REACTI NON-REACTIVE   Component     Latest Ref Rng & Units 04/09/2017  Cholesterol     0 - 200 mg/dL 811 (H)  HDL Cholesterol     >40 mg/dL 28 (L)  Triglycerides     <150 mg/dL 914 (H)  LDL Cholesterol (Calc)     mg/dL (calc)   Total CHOL/HDL Ratio     RATIO 8.4  Non-HDL Cholesterol (Calc)     <130 mg/dL (calc)   VLDL     0 - 40 mg/dL UNABLE TO CALCULATE IF TRIGLYCERIDE OVER 400 mg/dL  LDL (calc)     0 - 99 mg/dL UNABLE TO CALCULATE IF TRIGLYCERIDE OVER 400 mg/dL  Hemoglobin N8G     4.8 - 5.6 % 6.6 (H)  Mean Plasma Glucose     mg/dL 956.21  RPR     NON-REACTI     Assessment: As you may recall, he is a 47 y.o. African American male with PMH of hypertension, HIV, smoker  admitted on 04/09/17 for right side tingling and weakness.  CT no acute infarct.  CTA head and neck and CT perfusion unremarkable.  MRI showed left BG/CR infarct.  EF 55-60%.  LDL not able to calculate due to high TG.  A1c 6.6.  Put on aspirin 81 and Lipitor 80.  Patient educated on smoking cessation. No residue deficit on follow up visit. Still smoking, not quit yet. Follows with Dr. Drue Second in ID clinic. BP still high, needs to take 2 tabs of BP meds as prescribed. Keep active.  Plan:  - continue ASA and lipitor for stroke prevention - check BP at home and record - take BP meds 2 tablets a day as instructed.  - Follow up with your primary care physician for stroke risk factor modification. Recommend maintain blood pressure goal <130/80, diabetes with hemoglobin A1c goal below 7.0% and lipids with LDL cholesterol goal below 70 mg/dL.  - quit smoking - follow up with ID  - healthy diet and regular exercise - follow up as needed.  I spent more than 25 minutes of face to face time with the patient. Greater than 50% of time was spent in counseling and coordination of care. We discussed stroke risk  factor modification, quit smoking, BP management and keep active with exercise.   No orders of the defined types were placed in this encounter.   No orders of the defined types were placed in this encounter.   Patient Instructions  - continue ASA and lipitor for stroke prevention - check BP at home and record - take BP meds 2 tablets a day as instructed.  - Follow up with your primary care physician for stroke risk factor modification. Recommend maintain blood pressure goal <130/80, diabetes with hemoglobin A1c goal below 7.0% and lipids with LDL cholesterol goal below 70 mg/dL.  - quit smoking - follow up with ID  - healthy diet and regular exercise - follow up as needed.   Marvel Plan, MD PhD [facility] [address] [place], [place] [address] [phone]

## 2017-07-08 ENCOUNTER — Ambulatory Visit (INDEPENDENT_AMBULATORY_CARE_PROVIDER_SITE_OTHER): Payer: Self-pay | Admitting: Urgent Care

## 2017-07-08 ENCOUNTER — Telehealth: Payer: Self-pay | Admitting: *Deleted

## 2017-07-08 ENCOUNTER — Encounter: Payer: Self-pay | Admitting: Urgent Care

## 2017-07-08 VITALS — BP 180/100 | HR 86 | Temp 97.9°F | Resp 16 | Ht 67.0 in | Wt 206.8 lb

## 2017-07-08 DIAGNOSIS — Z024 Encounter for examination for driving license: Secondary | ICD-10-CM

## 2017-07-08 DIAGNOSIS — I10 Essential (primary) hypertension: Secondary | ICD-10-CM

## 2017-07-08 DIAGNOSIS — G459 Transient cerebral ischemic attack, unspecified: Secondary | ICD-10-CM

## 2017-07-08 DIAGNOSIS — R030 Elevated blood-pressure reading, without diagnosis of hypertension: Secondary | ICD-10-CM

## 2017-07-08 DIAGNOSIS — I639 Cerebral infarction, unspecified: Secondary | ICD-10-CM

## 2017-07-08 NOTE — Progress Notes (Signed)
    Commercial Driver Medical Examination   Lita MainsRonnie Wheeler is a 47 y.o. male who presents today for a DOT physical exam.  Patient was recently treated for TIA, cerebral infarct.  This is qualifies patient for up to year.  The following portions of the patient's history were reviewed and updated as appropriate: allergies, current medications, past family history, past medical history, past social history and past surgical history.  Objective:   BP (!) 180/100   Pulse 86   Temp 97.9 F (36.6 C) (Oral)   Resp 16   Ht 5\' 7"  (1.702 m)   Wt 206 lb 12.8 oz (93.8 kg)   SpO2 97%   BMI 32.39 kg/m   BP Readings from Last 3 Encounters:  07/08/17 (!) 180/100  06/16/17 (!) 164/102  04/13/17 (!) 146/98    Vision/hearing:  Visual Acuity Screening   Right eye Left eye Both eyes  Without correction: 20/25-1 20/25 20/25  With correction:     Comments: Peripheral Vision: Right eye 85 degrees. Left eye 85 degrees.  The patient can distinguish the colors red, amber and green.  Hearing Screening Comments: The patient was able to hear a forced whisper from 10 feet.  Physical Exam  Labs: Comments: Urine Specimen:  SpGr.  1010   Protein:  30   Blood:  Moderate   Sugar:  Negative  Assessment:    Does not meet standards in 49 CFR 391.41.    Plan:   Counseled patient on need for better management of his high blood pressure and continued follow-up for his cerebral infarct.  Advised patient on the laws pertaining to cerebral infarct and his availability for a DOT certificate.  Patient must not have a recurrence of his symptoms, TIA, cerebral infarct for at least a year before being considered for recertification.  Patient verbalized understanding and will follow-up with his cardiovascular physician and neurologist.  DOT exam was not performed, patient was not charged.  Return as needed.  Wallis BambergMario Roldan Laforest, PA-C [facility] at [facility] [phone] 07/08/2017  3:58 PM

## 2017-07-08 NOTE — Patient Instructions (Addendum)
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 Thereafter, you can be certified for up to 1 year but only if your blood pressure is less than 140/90. Please follow up with your PCP as it is urgent that you control your blood pressure to avoid having a recurrence of a TIA, stroke or even a heart attack.    IF you received an x-ray today, you will receive an invoice from [facility]. Please contact [facility] at [phone] with questions or concerns regarding your invoice.   IF you received labwork today, you will receive an invoice from [facility]. Please contact [facility] at [phone] with questions or concerns regarding your invoice.   Our billing staff will not be able to assist you with questions regarding bills from these companies.  You will be contacted with the lab results as soon as they are available. The fastest way to get your results is to activate your My Chart account. Instructions are located on the last page of this paperwork. If you have not heard from us regarding the results in 2 weeks, please contact this office.

## 2017-07-08 NOTE — Telephone Encounter (Signed)
Patient was seen today at [facility] for a DOT physical and his BP was elevated. He was told by doctor there to see his doctor as soon as possible to have his BP medication adjusted. He is uninsured and only sees Dr Ninetta LightsHatcher and is very upset and afraid he will have stroke if he does not get this taken care of NOW. Advised it is 450 pm and we will have to schedule him to see a provider in order to have his medication adjusted. He is coming to see [name] on Tuesday 07/13/17 at 415.

## 2017-07-13 ENCOUNTER — Ambulatory Visit (INDEPENDENT_AMBULATORY_CARE_PROVIDER_SITE_OTHER): Payer: Self-pay | Admitting: Infectious Diseases

## 2017-07-13 ENCOUNTER — Encounter: Payer: Self-pay | Admitting: Infectious Diseases

## 2017-07-13 DIAGNOSIS — N183 Chronic kidney disease, stage 3 unspecified: Secondary | ICD-10-CM

## 2017-07-13 DIAGNOSIS — I10 Essential (primary) hypertension: Secondary | ICD-10-CM

## 2017-07-13 DIAGNOSIS — Z21 Asymptomatic human immunodeficiency virus [HIV] infection status: Secondary | ICD-10-CM

## 2017-07-13 MED ORDER — AMLODIPINE BESYLATE 5 MG PO TABS: 5.0000 mg | ORAL_TABLET | Freq: Every day | ORAL | 5 refills | Status: DC

## 2017-07-13 MED ORDER — ASPIRIN EC 81 MG PO TBEC: 81.0000 mg | DELAYED_RELEASE_TABLET | Freq: Every day | ORAL | 3 refills | Status: DC

## 2017-07-13 NOTE — Assessment & Plan Note (Addendum)
Uncontrolled with recent evaluation at DOT physical (180 systolic). Slightly over target of 130/80 today but cannot tell me what they run at home as does not monitor consistently. Does have some associated headaches at times. Since starting lisinopril/HCTZ has had notable increase in creatinine. I will change his antihypertensive to amlodipine 5 mg QD. I have asked him to either buy a BP cuff or go to local pharmacy 1-2 times a week to keep a log of his blood pressures for reference. Smoking cessation also discussed. He does not claim to be diabetic however last HgbA1C 6.6% - would trial adding back lower dose of lisinopril (2.5 - 5 mg) at future visit considering benefit in diabetic patients. May need metformin added.

## 2017-07-13 NOTE — Patient Instructions (Addendum)
Your target for your blood pressure is 130/80. At [facility] 1-2 times a week I would like for you to check your blood pressure and record them. If they are consistently higher than this please send a MyChart to adjust your dose.   Common side effects with this medication can be some leg swelling and mild constipation.   Stop your lisinopril/hydrochlorothiazide medication.   Start your amlodipine one pill once a day tomorrow.   Please come back in 1 month for a visit with Dr. Drue Second to recheck your Blood pressure and follow up for your HIV.

## 2017-07-13 NOTE — Assessment & Plan Note (Signed)
Overdue for follow up with Dr. Drue Second - I asked him to please schedule an appt in 1 month to check in with her for routine care and follow up with BP.

## 2017-07-13 NOTE — Assessment & Plan Note (Signed)
Partially may be doe to [facility] however was stable on this prior to adding ACEi. Will monitor in the future after switching to amlodipine.

## 2017-07-13 NOTE — Progress Notes (Signed)
Name: Donald Wheeler  DOB: 1970-04-01 MRN: [id number] PCP: Patient, No Pcp Per   Chief Complaint  Patient presents with  . Hypertension     Patient Active Problem List   Diagnosis Date Noted  . Mixed hyperlipidemia 06/16/2017  . 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 Solitary pulmonary nodule 04/10/2017  . Focal neurological deficit 04/09/2017  . CKD (chronic kidney disease) stage 3, GFR 30-59 ml/min (HCC) 04/09/2017  . Smoker 04/09/2017  . HTN (hypertension), benign 09/07/2013  . Bell's palsy 09/07/2013  . Hypertriglyceridemia 09/07/2013  . HIV (human immunodeficiency virus infection) (HCC) 03/17/2012   Subjective:  Donald Wheeler is here today for management of his blood pressure. He recently was at a DOT physical and had very elevated BP reading with "top number above 180." He has been taking his lisinopril/HCTZ 1 tab daily but does not check his BP at home regularly. At that visit he reported having a headache and was anxious he was having another stroke event. No neurologic deficits and headache is gone now. No vision changes. He tells me he is not diabetic. Smokes cigarettes daily. Drives locally here in [place]. Compliant on his Genvoya regimen for HIV care; uncertain when he has a follow up with Dr. Drue Wheeler.   Review of Systems  Constitutional: Negative for chills, fever, malaise/fatigue and weight loss.  HENT: Negative for sore throat.        No dental problems  Respiratory: Negative for cough and sputum production.   Cardiovascular: Negative for chest pain and leg swelling.  Gastrointestinal: Negative for abdominal pain, diarrhea and vomiting.  Genitourinary: Negative for dysuria and flank pain.  Musculoskeletal: Negative for joint pain, myalgias and neck pain.  Skin: Negative for rash.  Neurological: Positive for tingling and headaches. Negative for dizziness.  Psychiatric/Behavioral: Negative for depression and substance abuse. The patient is not  nervous/anxious and does not have insomnia.     Past Medical History:  Diagnosis Date  . HIV (human immunodeficiency virus infection) (HCC)    dx'd 2000s  . Hypertension   . Stroke (HCC) 04/09/2017   vs TIA w/acute onset of right-sided weakness as well as trouble speaking/notes 04/09/2017    Outpatient Medications Prior to Visit  Medication Sig Dispense Refill  . bictegravir-emtricitabine-tenofovir AF (BIKTARVY) 50-200-25 MG TABS tablet Take 1 tablet by mouth daily. 30 tablet 11  . aspirin EC 81 MG tablet Take 1 tablet (81 mg total) by mouth daily. 100 tablet 3  . atorvastatin (LIPITOR) 80 MG tablet Take 1 tablet (80 mg total) by mouth daily at 6 PM. 30 tablet 0  . lisinopril-hydrochlorothiazide (PRINZIDE,ZESTORETIC) 10-12.5 MG tablet Take 2 tablets by mouth daily. 60 tablet 0   No facility-administered medications prior to visit.      No Known Allergies  Social History   Tobacco Use  . Smoking status: Current Every Day Smoker    Packs/day: 0.20    Years: 15.00    Pack years: 3.00    Types: Cigars  . Smokeless tobacco: Never Used  . Tobacco comment: black and mildsl daily  Substance Use Topics  . Alcohol use: Yes    Comment: 04/09/2017 "might have 2 beers/month; if that"  . Drug use: Yes    Types: Marijuana    Comment: 04/09/2017 "maybe 10 times/year"    No family history on file.  Social History   Substance and Sexual Activity  Sexual Activity Never  . Partners: Female  . Birth control/protection: None, Condom   Comment: patient given  condoms    Objective:   Vitals:   07/13/17 1610  BP: 134/88  Pulse: 84  Temp: 97.9 F (36.6 C)  TempSrc: Oral  Weight: 203 lb (92.1 kg)   Body mass index is 31.79 kg/m.  Physical Exam  Constitutional: He is oriented to person, place, and time. He appears well-developed and well-nourished.  Seated comfortably in chair during visit. Appears worried about his health.   HENT:  Mouth/Throat: Oropharynx is clear and moist  and mucous membranes are normal. Normal dentition. No dental abscesses.  Cardiovascular: Normal rate, regular rhythm and normal heart sounds.  Pulmonary/Chest: Effort normal and breath sounds normal.  Abdominal: Soft. He exhibits no distension. There is no tenderness.  Lymphadenopathy:    He has no cervical adenopathy.  Neurological: He is alert and oriented to person, place, and time.  Skin: Skin is warm and dry. No rash noted.  Psychiatric: He has a normal mood and affect. Judgment normal.  Vitals reviewed.  Lab Results Lab Results  Component Value Date   WBC 7.8 05/17/2017   HGB 15.3 05/17/2017   HCT 43.4 05/17/2017   MCV 86.3 05/17/2017   PLT 174 05/17/2017    Lab Results  Component Value Date   CREATININE 1.65 (H) 05/17/2017   BUN 13 05/17/2017   NA 140 05/17/2017   K 3.9 05/17/2017   CL 104 05/17/2017   CO2 29 05/17/2017    Lab Results  Component Value Date   ALT 12 (L) 04/10/2017   AST 15 04/10/2017   ALKPHOS 68 04/10/2017   BILITOT 0.6 04/10/2017    Lab Results  Component Value Date   CHOL 234 (H) 04/09/2017   HDL 28 (L) 04/09/2017   LDLCALC UNABLE TO CALCULATE IF TRIGLYCERIDE OVER 400 mg/dL 16/12/9602   TRIG 540 (H) 04/09/2017   CHOLHDL 8.4 04/09/2017   HIV 1 RNA Quant (copies/mL)  Date Value  05/17/2017 99 (H)  02/23/2017 66 (H)  04/12/2014 18,208 (H)   CD4 T Cell Abs (/uL)  Date Value  05/17/2017 430  02/23/2017 370 (L)  04/27/2016 260 (L)     Assessment & Plan:   Problem List Items Addressed This Visit      Cardiovascular and Mediastinum   HTN (hypertension), benign    Uncontrolled with recent evaluation at DOT physical (180 systolic). Slightly over target of 130/80 today but cannot tell me what they run at home as does not monitor consistently. Does have some associated headaches at times. Since starting lisinopril/HCTZ has had notable increase in creatinine. I will change his antihypertensive to amlodipine 5 mg QD. I have asked him to  either buy a BP cuff or go to local pharmacy 1-2 times a week to keep a log of his blood pressures for reference. Smoking cessation also discussed. He does not claim to be diabetic however last HgbA1C 6.6% - would trial adding back lower dose of lisinopril (2.5 - 5 mg) at future visit considering benefit in diabetic patients. May need metformin added.       Relevant Medications   aspirin EC 81 MG tablet   amLODipine (NORVASC) 5 MG tablet     Genitourinary   CKD (chronic kidney disease) stage 3, GFR 30-59 ml/min (HCC)    Partially may be doe to [facility] however was stable on this prior to adding ACEi. Will monitor in the future after switching to amlodipine.         Other   HIV (human immunodeficiency virus infection) (HCC)  Overdue for follow up with Dr. Drue Wheeler - I asked him to please schedule an appt in 1 month to check in with her for routine care and follow up with BP.         Rexene Alberts, MSN, NP-C [facility] Pager: [phone] Office: [phone]  07/13/17  4:50 PM

## 2017-07-28 ENCOUNTER — Ambulatory Visit (HOSPITAL_COMMUNITY)
Admission: EM | Admit: 2017-07-28 | Discharge: 2017-07-28 | Disposition: A | Discharge status: Home / Self Care | Payer: Self-pay | Attending: Family Medicine | Admitting: Family Medicine

## 2017-07-28 ENCOUNTER — Other Ambulatory Visit: Payer: Self-pay

## 2017-07-28 ENCOUNTER — Encounter (HOSPITAL_COMMUNITY): Payer: Self-pay | Admitting: Emergency Medicine

## 2017-07-28 DIAGNOSIS — D849 Immunodeficiency, unspecified: Secondary | ICD-10-CM | POA: Insufficient documentation

## 2017-07-28 DIAGNOSIS — R310 Gross hematuria: Secondary | ICD-10-CM | POA: Insufficient documentation

## 2017-07-28 DIAGNOSIS — J069 Acute upper respiratory infection, unspecified: Secondary | ICD-10-CM | POA: Insufficient documentation

## 2017-07-28 DIAGNOSIS — I10 Essential (primary) hypertension: Secondary | ICD-10-CM | POA: Insufficient documentation

## 2017-07-28 DIAGNOSIS — R300 Dysuria: Secondary | ICD-10-CM | POA: Insufficient documentation

## 2017-07-28 DIAGNOSIS — R319 Hematuria, unspecified: Secondary | ICD-10-CM

## 2017-07-28 DIAGNOSIS — R350 Frequency of micturition: Secondary | ICD-10-CM | POA: Insufficient documentation

## 2017-07-28 DIAGNOSIS — F17210 Nicotine dependence, cigarettes, uncomplicated: Secondary | ICD-10-CM | POA: Insufficient documentation

## 2017-07-28 LAB — POCT URINALYSIS DIP (DEVICE)
Bilirubin Urine: NEGATIVE
Glucose, UA: NEGATIVE mg/dL
Ketones, ur: NEGATIVE mg/dL
LEUKOCYTES UA: NEGATIVE
NITRITE: NEGATIVE
Specific Gravity, Urine: >=1.03 (ref 1.005–1.030)
UROBILINOGEN UA: 0.2 mg/dL (ref 0.0–1.0)
pH: 6 (ref 5.0–8.0)

## 2017-07-28 MED ORDER — CEPHALEXIN 500 MG PO CAPS: 500.0000 mg | ORAL_CAPSULE | Freq: Two times a day (BID) | ORAL | 0 refills | Status: DC

## 2017-07-28 NOTE — ED Notes (Signed)
Dirty and clean urine collected. 

## 2017-07-28 NOTE — ED Provider Notes (Signed)
[facility]    ASSESSMENT & PLAN:  1. Hematuria, unspecified type    Meds ordered this encounter  Medications  . cephALEXin (KEFLEX) 500 MG capsule    Sig: Take 1 capsule (500 mg total) by mouth 2 (two) times daily.    Dispense:  14 capsule    Refill:  0   Will treat as URI based on his associated symptoms of dysuria and chills. Urine culture sent. Will notify patient when results available. Will follow up with his PCP or here if not showing improvement over the next 48 hours, sooner if needed.  With his smoking history and now gross hematuria, I recommended that he schedule a follow up evaluation with urology. Information given. He agrees and will self-schedule. Outlined signs and symptoms indicating need for more acute intervention. Patient verbalized understanding. After Visit Summary given.  SUBJECTIVE:  Covey Baller is a 47 y.o. male who complains of gross hematuria first noticed approx 2 days ago. No h/o similar. Mild on/off dysuria. No urinary hesitancy. Mild urinary frequency. No flank pain or penile discharge reported. No abdominal pain. Normal PO intake. No abdominal pain. No self treatment. Ambulatory without difficulty.  Social History   Tobacco Use  Smoking Status Current Every Day Smoker  . Packs/day: 0.20  . Years: 15.00  . Pack years: 3.00  . Types: Cigars  Smokeless Tobacco Never Used  Tobacco Comment   black and mildsl daily   ROS: As in HPI.  OBJECTIVE:  Vitals:   07/28/17 1054  BP: (!) 166/101  Pulse: 75  Resp: 18  Temp: 99 F (37.2 C)  TempSrc: Oral  SpO2: 99%   Appears well, in no apparent distress. Abdomen is soft without tenderness, guarding, mass, rebound or organomegaly. No CVA tenderness or inguinal adenopathy noted.  Labs Reviewed  POCT URINALYSIS DIP (DEVICE) - Abnormal; Notable for the following components:      Result Value   Hgb urine dipstick LARGE (*)    Protein, ur >=300 (*)    All other components within  normal limits  URINE CULTURE    No Known Allergies  Past Medical History:  Diagnosis Date  . HIV (human immunodeficiency virus infection) (HCC)    dx'd 2000s  . Hypertension   . Stroke (HCC) 04/09/2017   vs TIA w/acute onset of right-sided weakness as well as trouble speaking/notes 04/09/2017   Social History   Socioeconomic History  . Marital status: Divorced    Spouse name: Not on file  . Number of children: Not on file  . Years of education: Not on file  . Highest education level: Not on file  Occupational History  . Not on file  Social Needs  . Financial resource strain: Not on file  . Food insecurity:    Worry: Not on file    Inability: Not on file  . Transportation needs:    Medical: Not on file    Non-medical: Not on file  Tobacco Use  . Smoking status: Current Every Day Smoker    Packs/day: 0.20    Years: 15.00    Pack years: 3.00    Types: Cigars  . Smokeless tobacco: Never Used  . Tobacco comment: black and mildsl daily  Substance and Sexual Activity  . Alcohol use: Yes    Comment: 04/09/2017 "might have 2 beers/month; if that"  . Drug use: Yes    Types: Marijuana    Comment: 04/09/2017 "maybe 10 times/year"  . Sexual activity: Never  Partners: Female    Birth control/protection: None, Condom    Comment: patient given condoms  Lifestyle  . Physical activity:    Days per week: Not on file    Minutes per session: Not on file  . Stress: Not on file  Relationships  . Social connections:    Talks on phone: Not on file    Gets together: Not on file    Attends religious service: Not on file    Active member of club or organization: Not on file    Attends meetings of clubs or organizations: Not on file    Relationship status: Not on file  . Intimate partner violence:    Fear of current or ex partner: Not on file    Emotionally abused: Not on file    Physically abused: Not on file    Forced sexual activity: Not on file  Other Topics Concern  . Not  on file  Social History Narrative  . Not on file   No FH of bladder cancer.     Mardella Layman, MD 07/28/17 1228

## 2017-07-28 NOTE — ED Triage Notes (Signed)
The patient presented to the [facility] with a complaint of hematuria and dysuria x 2 days.

## 2017-07-29 LAB — URINE CULTURE: CULTURE: NO GROWTH

## 2017-08-09 ENCOUNTER — Other Ambulatory Visit: Payer: Self-pay | Admitting: Internal Medicine

## 2017-08-09 DIAGNOSIS — I10 Essential (primary) hypertension: Secondary | ICD-10-CM

## 2017-08-12 ENCOUNTER — Ambulatory Visit: Payer: Self-pay | Admitting: Infectious Diseases

## 2017-08-16 ENCOUNTER — Ambulatory Visit (INDEPENDENT_AMBULATORY_CARE_PROVIDER_SITE_OTHER): Payer: Self-pay | Admitting: Infectious Diseases

## 2017-08-16 ENCOUNTER — Encounter: Payer: Self-pay | Admitting: Infectious Diseases

## 2017-08-16 ENCOUNTER — Telehealth: Payer: Self-pay

## 2017-08-16 VITALS — BP 179/103 | HR 73 | Temp 98.3°F | Wt 207.8 lb

## 2017-08-16 DIAGNOSIS — N183 Chronic kidney disease, stage 3 unspecified: Secondary | ICD-10-CM

## 2017-08-16 DIAGNOSIS — F17200 Nicotine dependence, unspecified, uncomplicated: Secondary | ICD-10-CM

## 2017-08-16 DIAGNOSIS — I10 Essential (primary) hypertension: Secondary | ICD-10-CM

## 2017-08-16 DIAGNOSIS — E782 Mixed hyperlipidemia: Secondary | ICD-10-CM

## 2017-08-16 DIAGNOSIS — Z125 Encounter for screening for malignant neoplasm of prostate: Secondary | ICD-10-CM

## 2017-08-16 DIAGNOSIS — Z87448 Personal history of other diseases of urinary system: Secondary | ICD-10-CM | POA: Insufficient documentation

## 2017-08-16 DIAGNOSIS — Z21 Asymptomatic human immunodeficiency virus [HIV] infection status: Secondary | ICD-10-CM

## 2017-08-16 MED ORDER — AMLODIPINE BESYLATE 10 MG PO TABS: 10.0000 mg | ORAL_TABLET | Freq: Every day | ORAL | 5 refills | Status: DC

## 2017-08-16 NOTE — Progress Notes (Signed)
Name: Donald Wheeler  DOB: [birth year] MRN: [id number] PCP: Patient, No Pcp Per   Chief Complaint  Patient presents with  . Follow-up    HIV, blood pressure      Patient Active Problem List   Diagnosis Date Noted  . History of hematuria 08/16/2017  . Mixed hyperlipidemia 06/16/2017  . 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 Solitary pulmonary nodule 04/10/2017  . Focal neurological deficit 04/09/2017  . CKD (chronic kidney disease) stage 3, GFR 30-59 ml/min (HCC) 04/09/2017  . Smoker 04/09/2017  . HTN (hypertension), benign 09/07/2013  . HIV (human immunodeficiency virus infection) ([place]) 03/17/2012   Subjective:  Donald Wheeler is here today for follow up on his HIV infection and hypertension. He estimates that he missed one dose of his Biktarvy since our last meeting a month ago. No problems with medication but is not certain he has any medication at the pharmacy. He also is uncertain if he has aspirin there to pick up or any other prescriptions.   He is taking a blood pressure pill once a day and tells me he has not been able to check his blood pressures outside of our clinic. Reports no headaches or visual changes. He did have some blood in his urine that prompted ED visit a few weeks ago. He was prescribed an antibiotic at that time but he was unaware of this and it has cleared up on his own. Describes it was tea colored and had some very mild flank pain. No fevers or chills and all symptoms have resolved. Requesting prostate check today.   Review of Systems  Constitutional: Negative for chills, fever, malaise/fatigue and weight loss.  HENT: Negative for sore throat.        No dental problems  Respiratory: Negative for cough and sputum production.   Cardiovascular: Negative for chest pain and leg swelling.  Gastrointestinal: Negative for abdominal pain, diarrhea and vomiting.  Genitourinary: Negative for dysuria and flank pain.  Musculoskeletal: Negative for joint  pain, myalgias and neck pain.  Skin: Negative for rash.  Neurological: Positive for tingling and headaches. Negative for dizziness.  Psychiatric/Behavioral: Negative for depression and substance abuse. The patient is not nervous/anxious and does not have insomnia.     Past Medical History:  Diagnosis Date  . Bell's palsy 09/07/2013  . HIV (human immunodeficiency virus infection) ([place])    dx'd 2000s  . Hypertension   . Stroke ([place]) 04/09/2017   vs TIA w/acute onset of right-sided weakness as well as trouble speaking/notes 04/09/2017    Outpatient Medications Prior to Visit  Medication Sig Dispense Refill  . aspirin EC 81 MG tablet Take 1 tablet (81 mg total) by mouth daily. 100 tablet 3  . bictegravir-emtricitabine-tenofovir AF (BIKTARVY) 50-200-25 MG TABS tablet Take 1 tablet by mouth daily. 30 tablet 11  . amLODipine (NORVASC) 5 MG tablet Take 1 tablet (5 mg total) by mouth daily. 30 tablet 5  . cephALEXin (KEFLEX) 500 MG capsule Take 1 capsule (500 mg total) by mouth 2 (two) times daily. 14 capsule 0  . atorvastatin (LIPITOR) 80 MG tablet Take 1 tablet (80 mg total) by mouth daily at 6 PM. 30 tablet 0   No facility-administered medications prior to visit.      No Known Allergies  Social History   Tobacco Use  . Smoking status: Current Every Day Smoker    Packs/day: 0.20    Years: 15.00    Pack years: 3.00    Types: Cigars  .  Smokeless tobacco: Never Used  . Tobacco comment: black and mildsl daily  Substance Use Topics  . Alcohol use: Yes    Comment: 04/09/2017 "might have 2 beers/month; if that"  . Drug use: Yes    Types: Marijuana    Comment: 04/09/2017 "maybe 10 times/year"    No family history on file.  Social History   Substance and Sexual Activity  Sexual Activity Never  . Partners: Female  . Birth control/protection: None, Condom   Comment: patient given condoms    Objective:   Vitals:   08/16/17 0854 08/16/17 0916  BP: (!) 174/104 (!) 179/103    Pulse: 81 73  Temp: 98.3 F (36.8 C)   TempSrc: Oral   Weight: 207 lb 12.8 oz (94.3 kg)    Body mass index is 32.55 kg/m.  Physical Exam  Constitutional: He is oriented to person, place, and time. He appears well-developed and well-nourished.  Seated comfortably in chair during visit. Appears worried about his health.   HENT:  Mouth/Throat: Oropharynx is clear and moist and mucous membranes are normal. Normal dentition. No dental abscesses.  Cardiovascular: Normal rate, regular rhythm and normal heart sounds.  Pulmonary/Chest: Effort normal and breath sounds normal.  Abdominal: Soft. He exhibits no distension. There is no tenderness.  Lymphadenopathy:    He has no cervical adenopathy.  Neurological: He is alert and oriented to person, place, and time.  Skin: Skin is warm and dry. No rash noted.  Psychiatric: He has a normal mood and affect. Judgment normal.  Vitals reviewed.  Lab Results Lab Results  Component Value Date   WBC 7.8 05/17/2017   HGB 15.3 05/17/2017   HCT 43.4 05/17/2017   MCV 86.3 05/17/2017   PLT 174 05/17/2017    Lab Results  Component Value Date   CREATININE 1.65 (H) 05/17/2017   BUN 13 05/17/2017   NA 140 05/17/2017   K 3.9 05/17/2017   CL 104 05/17/2017   CO2 29 05/17/2017    Lab Results  Component Value Date   ALT 12 (L) 04/10/2017   AST 15 04/10/2017   ALKPHOS 68 04/10/2017   BILITOT 0.6 04/10/2017    Lab Results  Component Value Date   CHOL 234 (H) 04/09/2017   HDL 28 (L) 04/09/2017   LDLCALC UNABLE TO CALCULATE IF TRIGLYCERIDE OVER 400 mg/dL 04/09/2017   TRIG 413 (H) 04/09/2017   CHOLHDL 8.4 04/09/2017   HIV 1 RNA Quant (copies/mL)  Date Value  05/17/2017 99 (H)  02/23/2017 66 (H)  04/12/2014 18,208 (H)   CD4 T Cell Abs (/uL)  Date Value  05/17/2017 430  02/23/2017 370 (L)  04/27/2016 260 (L)     Assessment & Plan:   Problem List Items Addressed This Visit      Cardiovascular and Mediastinum   HTN (hypertension),  benign    Continues to be uncontrolled. Will increase amlodipine to 10 mg QD. Likely would need additional agent.  We discussed reducing sugar and flour/simple carbs to try to achieve even 5% weight loss to impact his BP. He is reluctant to want to achieve any weight loss from our discussion. Strong family history however so will likely require additional agent.   We are going to arrange follow up with internal medicine clinic for follow up and management. He needs to pick up his ASA as prescribed by his neurology team. Will obtain HgbA1C as well to determine if glycemic control agent is necessary.       Relevant Medications  amLODipine (NORVASC) 10 MG tablet   Other Relevant Orders   Hemoglobin A1c   Direct LDL   Lipid panel   Comp Met (CMET)     Genitourinary   CKD (chronic kidney disease) stage 3, GFR 30-59 ml/min (HCC)    Recheck bmet today after stopping ACEI.         Other   Smoker    Pre-contemplative. We discussed benefit to reduce blood pressure and overall reducing repeated cardiovascular event.       Mixed hyperlipidemia    Lab Results  Component Value Date   CHOL 234 (H) 04/09/2017   HDL 28 (L) 04/09/2017   LDLCALC UNABLE TO CALCULATE IF TRIGLYCERIDE OVER 400 mg/dL 04/09/2017   TRIG 413 (H) 04/09/2017   CHOLHDL 8.4 04/09/2017   Will check direct LDL today with lipids as he is fasting. He is not currently on a statin but needs to be (previously prescribed atorvastatin but rx was only for 30-days back in January after he was hospitalized for stroke. Will see what LDL is - may change him to rosovustatin 20 mg daily for high intensity statin therapy with need for aggressive LDL reduction. Will need to repeat in 2 months.       Relevant Medications   amLODipine (NORVASC) 10 MG tablet   HIV (human immunodeficiency virus infection) ([place]) - Primary    Will check viral load for trend today - he reports no missed doses of medications. Reviewed previous labs with him and  pointed out increasing viral load. Three months ago was ~ 100 copies. He does not report missed medication to explain this. Hopefully we do not need to consider adding boosted PI as he is a poor candidate for cobicistat with concurrent metabolic/cardiovascular conditions.  Recommend follow up in 3 months with Dr. Baxter Flattery.       Relevant Orders   HIV 1 RNA quant-no reflex-bld   T-helper cell (CD4)- ([facility] clinic only)   History of hematuria    Described to be more tea colored and not bright red. We discussed current USPSTF screening guidelines regarding prostate screening. His urine culture was negative in the ED and dark urine spontaneously resolved without antibiotic he was told to take (never picked it up). Will check PSA today. He is a smoker so consideration for bladder cancer should be considered if this returns.        Other Visit Diagnoses    Screening for prostate cancer       Relevant Orders   PSA     Janene Madeira, MSN, NP-C [facility] Pager: [phone] Office: [phone]  08/16/17  10:18 PM

## 2017-08-16 NOTE — Assessment & Plan Note (Addendum)
Will check viral load for trend today - he reports no missed doses of medications. Reviewed previous labs with him and pointed out increasing viral load. Three months ago was ~ 100 copies. He does not report missed medication to explain this. Hopefully we do not need to consider adding boosted PI as he is a poor candidate for cobicistat with concurrent metabolic/cardiovascular conditions.  Recommend follow up in 3 months with Dr. Drue SecondSnider.

## 2017-08-16 NOTE — Telephone Encounter (Signed)
Called pt to advise him of his appt to see Internal Medicine to establish PCP. PT was unable to take address and phone number of IM. However, was notified of his appt with IM and will reach out to us later this week if he has any questions about the appt.  Donald Wheeler, [place]

## 2017-08-16 NOTE — Assessment & Plan Note (Signed)
Described to be more tea colored and not bright red. We discussed current USPSTF screening guidelines regarding prostate screening. His urine culture was negative in the ED and dark urine spontaneously resolved without antibiotic he was told to take (never picked it up). Will check PSA today. He is a smoker so consideration for bladder cancer should be considered if this returns.

## 2017-08-16 NOTE — Progress Notes (Signed)
Creatinine improved after stopping ACE inhibitor. Blood sugar elevated (was fasting during these labs). Await A1C and decide to start metformin.

## 2017-08-16 NOTE — Assessment & Plan Note (Signed)
Pre-contemplative. We discussed benefit to reduce blood pressure and overall reducing repeated cardiovascular event.

## 2017-08-16 NOTE — Assessment & Plan Note (Addendum)
Continues to be uncontrolled. Will increase amlodipine to 10 mg QD. Likely would need additional agent.  We discussed reducing sugar and flour/simple carbs to try to achieve even 5% weight loss to impact his BP. He is reluctant to want to achieve any weight loss from our discussion. Strong family history however so will likely require additional agent.   We are going to arrange follow up with internal medicine clinic for follow up and management. He needs to pick up his ASA as prescribed by his neurology team. Will obtain HgbA1C as well to determine if glycemic control agent is necessary.

## 2017-08-16 NOTE — Telephone Encounter (Signed)
-----   Message from Jackelyn Polingoris A Solomon sent at 08/16/2017  1:26 PM EDT ----- Regarding: RE: PCP appointment  Hello,  Appointment 08/24/17 at 2:15 PM.  Please advise patient.  Thanks,  Doris ----- Message ----- From: Lorenso CourierMaldonado, Krystyl Cannell L, CMA Sent: 08/16/2017   8:59 AM To: Jackelyn Polingoris A Solomon Subject: PCP appointment                                Pt needs an appt for PCP PT has a history of High BP is currently on medication.  Would like to establish care to adjust medication.  PT is insured.  Please reply with an appt to update pt. Thank you!

## 2017-08-16 NOTE — Assessment & Plan Note (Signed)
Recheck bmet today after stopping ACEI.

## 2017-08-16 NOTE — Patient Instructions (Addendum)
[phone] for the pharmacy   Do not take the keflex (antibiotic that the ED gave you)  I want you to increase your amlodipine (blood pressure pill) to 10 mg once a day. Please pick up your new medication at the pharmacy - you will need to take one pill once a day.   Please pick up your aspirin today as well from pharmacy.   Will check blood work today to see how your kidneys, cholesterol and liver look. Will also check your prostate through blood work today as well as risk for diabetes.   We are working on getting you established with a primary care provider that can help manage these and talk about keeping you well.   Over the next 3 months would recommend reducing eating flour (breads, pastas, chips) and sugars (cookies, sodas, sugary cereals, sweet tea, cake) if you can - this will improve your blood pressure, cholesterol and blood sugar.   Please return to see Dr. Drue SecondSnider in 3 months.

## 2017-08-16 NOTE — Assessment & Plan Note (Signed)
Lab Results  Component Value Date   CHOL 234 (H) 04/09/2017   HDL 28 (L) 04/09/2017   LDLCALC UNABLE TO CALCULATE IF TRIGLYCERIDE OVER 400 mg/dL 16/10/960401/25/2019   TRIG 540413 (H) 04/09/2017   CHOLHDL 8.4 04/09/2017   Will check direct LDL today with lipids as he is fasting. He is not currently on a statin but needs to be (previously prescribed atorvastatin but rx was only for 30-days back in January after he was hospitalized for stroke. Will see what LDL is - may change him to rosovustatin 20 mg daily for high intensity statin therapy with need for aggressive LDL reduction. Will need to repeat in 2 months.

## 2017-08-17 LAB — COMPREHENSIVE METABOLIC PANEL
AG Ratio: 1.3 (calc) (ref 1.0–2.5)
ALBUMIN MSPROF: 4 g/dL (ref 3.6–5.1)
ALKALINE PHOSPHATASE (APISO): 76 U/L (ref 40–115)
ALT: 14 U/L (ref 9–46)
AST: 15 U/L (ref 10–40)
BILIRUBIN TOTAL: 0.5 mg/dL (ref 0.2–1.2)
BUN: 14 mg/dL (ref 7–25)
CALCIUM: 9 mg/dL (ref 8.6–10.3)
CHLORIDE: 104 mmol/L (ref 98–110)
CO2: 28 mmol/L (ref 20–32)
Creat: 1.31 mg/dL (ref 0.60–1.35)
GLOBULIN: 3 g/dL (ref 1.9–3.7)
Glucose, Bld: 158 mg/dL — ABNORMAL HIGH (ref 65–99)
POTASSIUM: 4.1 mmol/L (ref 3.5–5.3)
Sodium: 139 mmol/L (ref 135–146)
Total Protein: 7 g/dL (ref 6.1–8.1)

## 2017-08-17 LAB — PSA: PSA: 3.5 ng/mL (ref <=4.0)

## 2017-08-17 LAB — HEMOGLOBIN A1C
EAG (MMOL/L): 7.7 (calc)
Hgb A1c MFr Bld: 6.5 % of total Hgb — ABNORMAL HIGH (ref <5.7)
MEAN PLASMA GLUCOSE: 140 (calc)

## 2017-08-17 LAB — LDL CHOLESTEROL, DIRECT: LDL DIRECT: 124 mg/dL — AB (ref <100)

## 2017-08-17 LAB — T-HELPER CELL (CD4) - (RCID CLINIC ONLY)
CD4 % Helper T Cell: 13 % — ABNORMAL LOW (ref 33–55)
CD4 T CELL ABS: 340 /uL — AB (ref 400–2700)

## 2017-08-24 ENCOUNTER — Ambulatory Visit: Payer: Self-pay

## 2017-11-02 ENCOUNTER — Other Ambulatory Visit: Payer: Self-pay

## 2017-11-17 ENCOUNTER — Ambulatory Visit: Payer: Self-pay | Admitting: Internal Medicine

## 2017-12-14 DIAGNOSIS — E119 Type 2 diabetes mellitus without complications: Secondary | ICD-10-CM

## 2017-12-14 HISTORY — DX: Type 2 diabetes mellitus without complications: E11.9

## 2017-12-22 ENCOUNTER — Other Ambulatory Visit: Payer: Self-pay

## 2017-12-22 ENCOUNTER — Telehealth: Payer: Self-pay | Admitting: Behavioral Health

## 2017-12-22 ENCOUNTER — Other Ambulatory Visit: Payer: Self-pay | Admitting: Behavioral Health

## 2017-12-22 ENCOUNTER — Other Ambulatory Visit: Payer: Self-pay | Admitting: Internal Medicine

## 2017-12-22 DIAGNOSIS — Z21 Asymptomatic human immunodeficiency virus [HIV] infection status: Secondary | ICD-10-CM

## 2017-12-22 NOTE — Telephone Encounter (Signed)
Patient came in today as a walk in.  Patient states he is not feeling well.  He is unable to voice symptoms other than blurry vision stomach hurting, "just not feeling right.  Patient's vital signs were taken 130/90 and heart rate 84.  Patient states his b/p is never this good. Offered patient a same day appointment with Tammy Sours this afternoon and he refused.  Dr. Drue Second suggested he get lab work while he was here and scheduled him appointment with Judeth Cornfield next week.  Patient states he has also been constipated.  Encouraged to increase fluid intake and increase fiber in his diet.  Patient verbalized understanding. Angeline Slim RN

## 2017-12-23 ENCOUNTER — Other Ambulatory Visit: Payer: Self-pay

## 2017-12-23 ENCOUNTER — Encounter: Payer: Self-pay | Admitting: Infectious Diseases

## 2017-12-23 ENCOUNTER — Encounter (HOSPITAL_COMMUNITY): Payer: Self-pay | Admitting: *Deleted

## 2017-12-23 ENCOUNTER — Observation Stay (HOSPITAL_COMMUNITY)
Admission: EM | Admit: 2017-12-23 | Discharge: 2017-12-24 | Disposition: A | Discharge status: Home / Self Care | Payer: Self-pay | Attending: Internal Medicine | Admitting: Internal Medicine

## 2017-12-23 DIAGNOSIS — E782 Mixed hyperlipidemia: Secondary | ICD-10-CM | POA: Insufficient documentation

## 2017-12-23 DIAGNOSIS — I10 Essential (primary) hypertension: Secondary | ICD-10-CM

## 2017-12-23 DIAGNOSIS — Z87891 Personal history of nicotine dependence: Secondary | ICD-10-CM

## 2017-12-23 DIAGNOSIS — N179 Acute kidney failure, unspecified: Secondary | ICD-10-CM | POA: Insufficient documentation

## 2017-12-23 DIAGNOSIS — E119 Type 2 diabetes mellitus without complications: Secondary | ICD-10-CM

## 2017-12-23 DIAGNOSIS — N183 Chronic kidney disease, stage 3 (moderate): Secondary | ICD-10-CM | POA: Insufficient documentation

## 2017-12-23 DIAGNOSIS — B20 Human immunodeficiency virus [HIV] disease: Secondary | ICD-10-CM

## 2017-12-23 DIAGNOSIS — Z8673 Personal history of transient ischemic attack (TIA), and cerebral infarction without residual deficits: Secondary | ICD-10-CM | POA: Insufficient documentation

## 2017-12-23 DIAGNOSIS — Z794 Long term (current) use of insulin: Secondary | ICD-10-CM | POA: Insufficient documentation

## 2017-12-23 DIAGNOSIS — I129 Hypertensive chronic kidney disease with stage 1 through stage 4 chronic kidney disease, or unspecified chronic kidney disease: Secondary | ICD-10-CM | POA: Insufficient documentation

## 2017-12-23 DIAGNOSIS — E1122 Type 2 diabetes mellitus with diabetic chronic kidney disease: Secondary | ICD-10-CM | POA: Insufficient documentation

## 2017-12-23 DIAGNOSIS — E1100 Type 2 diabetes mellitus with hyperosmolarity without nonketotic hyperglycemic-hyperosmolar coma (NKHHC): Principal | ICD-10-CM | POA: Diagnosis present

## 2017-12-23 DIAGNOSIS — R739 Hyperglycemia, unspecified: Secondary | ICD-10-CM

## 2017-12-23 DIAGNOSIS — Z79899 Other long term (current) drug therapy: Secondary | ICD-10-CM | POA: Insufficient documentation

## 2017-12-23 DIAGNOSIS — E871 Hypo-osmolality and hyponatremia: Secondary | ICD-10-CM

## 2017-12-23 DIAGNOSIS — Z21 Asymptomatic human immunodeficiency virus [HIV] infection status: Secondary | ICD-10-CM | POA: Insufficient documentation

## 2017-12-23 DIAGNOSIS — Z7982 Long term (current) use of aspirin: Secondary | ICD-10-CM | POA: Insufficient documentation

## 2017-12-23 HISTORY — DX: Type 2 diabetes mellitus without complications: E11.9

## 2017-12-23 LAB — CBC
HCT: 40.1 % (ref 39.0–52.0)
HEMATOCRIT: 47.1 % (ref 39.0–52.0)
Hemoglobin: 15.2 g/dL (ref 13.0–17.0)
Hemoglobin: 13.5 g/dL (ref 13.0–17.0)
MCH: 27.6 pg (ref 26.0–34.0)
MCH: 28 pg (ref 26.0–34.0)
MCHC: 32.3 g/dL (ref 30.0–36.0)
MCHC: 33.7 g/dL (ref 30.0–36.0)
MCV: 85.6 fL (ref 80.0–100.0)
MCV: 83 fL (ref 80.0–100.0)
NRBC: 0 % (ref 0.0–0.2)
PLATELETS: 161 10*3/uL (ref 150–400)
Platelets: 139 10*3/uL — ABNORMAL LOW (ref 150–400)
RBC: 5.5 MIL/uL (ref 4.22–5.81)
RBC: 4.83 MIL/uL (ref 4.22–5.81)
RDW: 12.6 % (ref 11.5–15.5)
RDW: 12.4 % (ref 11.5–15.5)
WBC: 5.7 10*3/uL (ref 4.0–10.5)
WBC: 6.8 10*3/uL (ref 4.0–10.5)
nRBC: 0 % (ref 0.0–0.2)

## 2017-12-23 LAB — CBG MONITORING, ED
GLUCOSE-CAPILLARY: 595 mg/dL — AB (ref 70–99)
GLUCOSE-CAPILLARY: 407 mg/dL — AB (ref 70–99)
GLUCOSE-CAPILLARY: 287 mg/dL — AB (ref 70–99)
GLUCOSE-CAPILLARY: 149 mg/dL — AB (ref 70–99)
GLUCOSE-CAPILLARY: 137 mg/dL — AB (ref 70–99)
Glucose-Capillary: >600 mg/dL (ref 70–99)
Glucose-Capillary: 314 mg/dL — ABNORMAL HIGH (ref 70–99)

## 2017-12-23 LAB — LIPASE, BLOOD: Lipase: 122 U/L — ABNORMAL HIGH (ref 11–51)

## 2017-12-23 LAB — BASIC METABOLIC PANEL
Anion gap: 8 (ref 5–15)
BUN: 13 mg/dL (ref 6–20)
CO2: 25 mmol/L (ref 22–32)
Calcium: 8.5 mg/dL — ABNORMAL LOW (ref 8.9–10.3)
Chloride: 107 mmol/L (ref 98–111)
Creatinine, Ser: 1.26 mg/dL — ABNORMAL HIGH (ref 0.61–1.24)
GFR calc Af Amer: >60 mL/min (ref >60)
GFR calc non Af Amer: >60 mL/min (ref >60)
Glucose, Bld: 260 mg/dL — ABNORMAL HIGH (ref 70–99)
Potassium: 4 mmol/L (ref 3.5–5.1)
Sodium: 140 mmol/L (ref 135–145)

## 2017-12-23 LAB — COMPREHENSIVE METABOLIC PANEL
ALBUMIN: 3.7 g/dL (ref 3.5–5.0)
ALT: 19 U/L (ref 0–44)
ANION GAP: 11 (ref 5–15)
AST: 21 U/L (ref 15–41)
Alkaline Phosphatase: 112 U/L (ref 38–126)
BUN: 25 mg/dL — AB (ref 6–20)
CO2: 24 mmol/L (ref 22–32)
CREATININE: 1.75 mg/dL — AB (ref 0.61–1.24)
Calcium: 9 mg/dL (ref 8.9–10.3)
Chloride: 90 mmol/L — ABNORMAL LOW (ref 98–111)
GFR calc Af Amer: 51 mL/min — ABNORMAL LOW (ref >60)
GFR calc non Af Amer: 44 mL/min — ABNORMAL LOW (ref >60)
GLUCOSE: 988 mg/dL — AB (ref 70–99)
Potassium: 5 mmol/L (ref 3.5–5.1)
SODIUM: 125 mmol/L — AB (ref 135–145)
Total Bilirubin: 1 mg/dL (ref 0.3–1.2)
Total Protein: 7.2 g/dL (ref 6.5–8.1)

## 2017-12-23 LAB — I-STAT VENOUS BLOOD GAS, ED
Acid-base deficit: 1 mmol/L (ref 0.0–2.0)
Bicarbonate: 26.9 mmol/L (ref 20.0–28.0)
O2 Saturation: 60 %
PCO2 VEN: 54.5 mmHg (ref 44.0–60.0)
PH VEN: 7.301 (ref 7.250–7.430)
TCO2: 29 mmol/L (ref 22–32)
pO2, Ven: 35 mmHg (ref 32.0–45.0)

## 2017-12-23 LAB — URINALYSIS, ROUTINE W REFLEX MICROSCOPIC
Bilirubin Urine: NEGATIVE
KETONES UR: 5 mg/dL — AB
Nitrite: NEGATIVE
PH: 5 (ref 5.0–8.0)
Protein, ur: NEGATIVE mg/dL
SPECIFIC GRAVITY, URINE: 1.022 (ref 1.005–1.030)

## 2017-12-23 LAB — GLUCOSE, CAPILLARY
Glucose-Capillary: 227 mg/dL — ABNORMAL HIGH (ref 70–99)
Glucose-Capillary: 371 mg/dL — ABNORMAL HIGH (ref 70–99)
Glucose-Capillary: 333 mg/dL — ABNORMAL HIGH (ref 70–99)
Glucose-Capillary: 362 mg/dL — ABNORMAL HIGH (ref 70–99)

## 2017-12-23 LAB — LIPID PANEL
Cholesterol: 226 mg/dL — ABNORMAL HIGH (ref 0–200)
HDL: 35 mg/dL — ABNORMAL LOW (ref >40)
LDL Cholesterol: 141 mg/dL — ABNORMAL HIGH (ref 0–99)
Total CHOL/HDL Ratio: 6.5 RATIO
Triglycerides: 252 mg/dL — ABNORMAL HIGH (ref <150)
VLDL: 50 mg/dL — ABNORMAL HIGH (ref 0–40)

## 2017-12-23 LAB — T-HELPER CELL (CD4) - (RCID CLINIC ONLY)
CD4 T CELL ABS: 300 /uL — AB (ref 400–2700)
CD4 T CELL HELPER: 13 % — AB (ref 33–55)

## 2017-12-23 LAB — PHOSPHORUS: Phosphorus: 4.3 mg/dL (ref 2.5–4.6)

## 2017-12-23 LAB — HEMOGLOBIN A1C
Hgb A1c MFr Bld: 13.8 % — ABNORMAL HIGH (ref 4.8–5.6)
Mean Plasma Glucose: 349.36 mg/dL

## 2017-12-23 LAB — MAGNESIUM: Magnesium: 2.7 mg/dL — ABNORMAL HIGH (ref 1.7–2.4)

## 2017-12-23 LAB — BETA-HYDROXYBUTYRIC ACID: BETA-HYDROXYBUTYRIC ACID: 0.96 mmol/L — AB (ref 0.05–0.27)

## 2017-12-23 MED ORDER — BICTEGRAVIR-EMTRICITAB-TENOFOV 50-200-25 MG PO TABS
1.0000 | ORAL_TABLET | Freq: Every day | ORAL | Status: DC
  Administered 2017-12-24: 1 via ORAL
  Filled 2017-12-23 (×2): qty 1

## 2017-12-23 MED ORDER — SODIUM CHLORIDE 0.9 % IV BOLUS (SEPSIS)
1000.0000 mL | Freq: Once | INTRAVENOUS | Status: AC
  Administered 2017-12-23: 1000 mL via INTRAVENOUS

## 2017-12-23 MED ORDER — INSULIN GLARGINE 100 UNIT/ML ~~LOC~~ SOLN
10.0000 [IU] | Freq: Two times a day (BID) | SUBCUTANEOUS | Status: DC
  Administered 2017-12-23 – 2017-12-24 (×2): 10 [IU] via SUBCUTANEOUS
  Filled 2017-12-23 (×5): qty 0.1

## 2017-12-23 MED ORDER — AMLODIPINE BESYLATE 10 MG PO TABS
10.0000 mg | ORAL_TABLET | Freq: Every day | ORAL | Status: DC
  Administered 2017-12-23 – 2017-12-24 (×2): 10 mg via ORAL
  Filled 2017-12-23 (×2): qty 1

## 2017-12-23 MED ORDER — INSULIN REGULAR(HUMAN) IN NACL 100-0.9 UT/100ML-% IV SOLN
INTRAVENOUS | Status: DC
  Administered 2017-12-23: 100 [IU] via INTRAVENOUS
  Filled 2017-12-23: qty 100

## 2017-12-23 MED ORDER — ATORVASTATIN CALCIUM 80 MG PO TABS
80.0000 mg | ORAL_TABLET | Freq: Every day | ORAL | Status: DC
  Administered 2017-12-23: 80 mg via ORAL
  Filled 2017-12-23: qty 1

## 2017-12-23 MED ORDER — SODIUM CHLORIDE 0.9 % IV SOLN: INTRAVENOUS | Status: DC

## 2017-12-23 MED ORDER — ENOXAPARIN SODIUM 40 MG/0.4ML ~~LOC~~ SOLN
40.0000 mg | SUBCUTANEOUS | Status: DC
  Administered 2017-12-23: 40 mg via SUBCUTANEOUS
  Filled 2017-12-23 (×2): qty 0.4

## 2017-12-23 MED ORDER — INSULIN ASPART 100 UNIT/ML ~~LOC~~ SOLN
0.0000 [IU] | SUBCUTANEOUS | Status: DC
  Administered 2017-12-23: 3 [IU] via SUBCUTANEOUS
  Administered 2017-12-23: 9 [IU] via SUBCUTANEOUS
  Administered 2017-12-23: 7 [IU] via SUBCUTANEOUS
  Administered 2017-12-24: 3 [IU] via SUBCUTANEOUS
  Administered 2017-12-24: 2 [IU] via SUBCUTANEOUS
  Filled 2017-12-23 (×6): qty 1

## 2017-12-23 MED ORDER — SODIUM CHLORIDE 0.9 % IV SOLN
INTRAVENOUS | Status: DC
  Administered 2017-12-23 – 2017-12-24 (×2): via INTRAVENOUS

## 2017-12-23 MED ORDER — SODIUM CHLORIDE 0.9 % IV SOLN
Freq: Once | INTRAVENOUS | Status: AC
  Administered 2017-12-23: 07:00:00 via INTRAVENOUS

## 2017-12-23 MED ORDER — LIVING WELL WITH DIABETES BOOK
Freq: Once | Status: AC
  Administered 2017-12-23: 18:00:00
  Filled 2017-12-23: qty 1

## 2017-12-23 MED ORDER — ASPIRIN EC 81 MG PO TBEC
81.0000 mg | DELAYED_RELEASE_TABLET | Freq: Every day | ORAL | Status: DC
  Administered 2017-12-23 – 2017-12-24 (×2): 81 mg via ORAL
  Filled 2017-12-23 (×2): qty 1

## 2017-12-23 NOTE — Consult Note (Signed)
[place] for Infectious Disease    Date of Admission:  12/23/2017   Total days of antibiotics: 0               Reason for Consult: HIV+, DM2    Referring Provider: [place]   Assessment: Dm2 (newly dx) HIV+   Plan: 1. glc control 2. Suspect he will need insulin 3. Continue biktarvy 4. UCx pending  Comment- Very unusual new onset Dm with extremely high Glc at initial dx.  He will need PCP f/u aside from his ID clinic f/u.  My great appreciation to [facility] for their care of this pt.    Thank you so much for this interesting consult,  Active Problems:   Hyperosmolar non-ketotic state in patient with type 2 diabetes mellitus ([place])   Diabetes mellitus, new onset ([place])   . amLODipine  10 mg Oral Daily  . aspirin EC  81 mg Oral Daily  . atorvastatin  80 mg Oral q1800  . bictegravir-emtricitabine-tenofovir AF  1 tablet Oral Daily  . enoxaparin (LOVENOX) injection  40 mg Subcutaneous Q24H  . insulin aspart  0-9 Units Subcutaneous Q4H  . insulin glargine  10 Units Subcutaneous BID  . living well with diabetes book   Does not apply Once    HPI: Donald Wheeler is a 47 y.o. male with hx of HIV (seen in [place], dx 200s, previously on biktarvy) and HTN adm with 1 week of polyuria. He was seen in clinic on 10-9 and was apparently unwell appearing. He was sent for labs and I was called last pm with a Glc reading of 823. His CO2 was 23.  I called and asked him to come to the ED.  He is unable to recall how long he has had HIV. He denies missed ART.   HIV 1 RNA Quant (copies/mL)  Date Value  05/17/2017 99 (H)  02/23/2017 66 (H)  04/12/2014 18,208 (H)   CD4 T Cell Abs (/uL)  Date Value  12/22/2017 300 (L)  08/16/2017 340 (L)  05/17/2017 430      Review of Systems: Review of Systems  Constitutional: Negative for chills and fever.  Eyes: Positive for blurred vision.  Respiratory: Negative for cough and shortness of breath.   Genitourinary: Positive for  frequency.  Please see HPI. All other systems reviewed and negative.   Past Medical History:  Diagnosis Date  . Bell's palsy 09/07/2013  . Diabetes mellitus without complication ([place]) 40/3474   NEW ONSET   . HIV (human immunodeficiency virus infection) ([place])    dx'd 2000s  . Hypertension   . Stroke ([place]) 04/09/2017   vs TIA w/acute onset of right-sided weakness as well as trouble speaking/notes 04/09/2017    Social History   Tobacco Use  . Smoking status: Former Smoker    Packs/day: 0.20    Years: 15.00    Pack years: 3.00    Types: Cigars    Last attempt to quit: 12/12/2017    Years since quitting: 0.0  . Smokeless tobacco: Never Used  . Tobacco comment: black and mildsl daily  Substance Use Topics  . Alcohol use: Yes    Comment: 04/09/2017 "might have 2 beers/month; if that"  . Drug use: Yes    Types: Marijuana    Comment: 04/09/2017 "maybe 10 times/year"    History reviewed. No pertinent family history.   Medications:  Scheduled: . amLODipine  10 mg Oral Daily  . aspirin EC  81 mg Oral Daily  . atorvastatin  80 mg Oral q1800  . bictegravir-emtricitabine-tenofovir AF  1 tablet Oral Daily  . enoxaparin (LOVENOX) injection  40 mg Subcutaneous Q24H  . insulin aspart  0-9 Units Subcutaneous Q4H  . insulin glargine  10 Units Subcutaneous BID  . living well with diabetes book   Does not apply Once    Abtx:  Anti-infectives (From admission, onward)   Start     Dose/Rate Route Frequency Ordered Stop   12/23/17 1445  bictegravir-emtricitabine-tenofovir AF (BIKTARVY) 50-200-25 MG per tablet 1 tablet     1 tablet Oral Daily 12/23/17 1433          OBJECTIVE: Blood pressure (!) 135/92, pulse (!) 57, temperature 97.9 F (36.6 C), temperature source Oral, resp. rate 16, height 5' 7" (1.702 m), weight 90.7 kg, SpO2 99 %.  Physical Exam  Constitutional: He is oriented to person, place, and time. He appears well-developed and well-nourished. No distress.  HENT:  Head:  Normocephalic and atraumatic.  Eyes: Pupils are equal, round, and reactive to light. EOM are normal.  Neck: Normal range of motion. Neck supple.  Cardiovascular: Normal rate, regular rhythm and normal heart sounds.  Pulmonary/Chest: Effort normal and breath sounds normal. No respiratory distress.  Abdominal: Soft. Bowel sounds are normal. He exhibits no distension. There is no tenderness.  Musculoskeletal: Normal range of motion. He exhibits no edema.  Lymphadenopathy:    He has no cervical adenopathy.  Neurological: He is alert and oriented to person, place, and time.  Skin: Skin is warm and dry.  Psychiatric: He has a normal mood and affect.    Lab Results Results for orders placed or performed during the hospital encounter of 12/23/17 (from the past 48 hour(s))  Lipase, blood     Status: Abnormal   Collection Time: 12/23/17  4:01 AM  Result Value Ref Range   Lipase 122 (H) 11 - 51 U/L    Comment: Performed at [facility], [employer] [address] [place], [place] [address]  Comprehensive metabolic panel     Status: Abnormal   Collection Time: 12/23/17  4:01 AM  Result Value Ref Range   Sodium 125 (L) 135 - 145 mmol/L   Potassium 5.0 3.5 - 5.1 mmol/L   Chloride 90 (L) 98 - 111 mmol/L   CO2 24 22 - 32 mmol/L   Glucose, Bld 988 (HH) 70 - 99 mg/dL    Comment: CRITICAL RESULT CALLED TO, READ BACK BY AND VERIFIED WITH: PHILLIPS T,RN 12/23/17 0453 WAYK    BUN 25 (H) 6 - 20 mg/dL   Creatinine, Ser 1.75 (H) 0.61 - 1.24 mg/dL   Calcium 9.0 8.9 - 10.3 mg/dL   Total Protein 7.2 6.5 - 8.1 g/dL   Albumin 3.7 3.5 - 5.0 g/dL   AST 21 15 - 41 U/L   ALT 19 0 - 44 U/L   Alkaline Phosphatase 112 38 - 126 U/L   Total Bilirubin 1.0 0.3 - 1.2 mg/dL   GFR calc non Af Amer 44 (L) >60 mL/min   GFR calc Af Amer 51 (L) >60 mL/min    Comment: (NOTE) The eGFR has been calculated using the CKD EPI equation. This calculation has not been validated in all clinical situations. eGFR's persistently <60  mL/min signify possible Chronic Kidney Disease.    Anion gap 11 5 - 15    Comment: Performed at [facility] [address], [place], [place] [address]  CBC  Status: None   Collection Time: 12/23/17  4:01 AM  Result Value Ref Range   WBC 5.7 4.0 - 10.5 K/uL   RBC 5.50 4.22 - 5.81 MIL/uL   Hemoglobin 15.2 13.0 - 17.0 g/dL   HCT 47.1 39.0 - 52.0 %   MCV 85.6 80.0 - 100.0 fL   MCH 27.6 26.0 - 34.0 pg   MCHC 32.3 30.0 - 36.0 g/dL   RDW 12.6 11.5 - 15.5 %   Platelets 161 150 - 400 K/uL   nRBC 0.0 0.0 - 0.2 %    Comment: Performed at [facility], [place] [address] [place], [place] [address]  Lipid panel     Status: Abnormal   Collection Time: 12/23/17  4:01 AM  Result Value Ref Range   Cholesterol 226 (H) 0 - 200 mg/dL   Triglycerides 252 (H) <150 mg/dL   HDL 35 (L) >40 mg/dL   Total CHOL/HDL Ratio 6.5 RATIO   VLDL 50 (H) 0 - 40 mg/dL   LDL Cholesterol 141 (H) 0 - 99 mg/dL    Comment:        Total Cholesterol/HDL:CHD Risk Coronary Heart Disease Risk Table                     Men   Women  1/2 Average Risk   3.4   3.3  Average Risk       5.0   4.4  2 X Average Risk   9.6   7.1  3 X Average Risk  23.4   11.0        Use the calculated Patient Ratio above and the CHD Risk Table to determine the patient's CHD Risk.        ATP III CLASSIFICATION (LDL):  <100     mg/dL   Optimal  100-129  mg/dL   Near or Above                    Optimal  130-159  mg/dL   Borderline  160-189  mg/dL   High  >190     mg/dL   Very High Performed at [facility] [address], [place], [place] [address]   Urinalysis, Routine w reflex microscopic     Status: Abnormal   Collection Time: 12/23/17  5:13 AM  Result Value Ref Range   Color, Urine STRAW (A) YELLOW   APPearance CLEAR CLEAR   Specific Gravity, Urine 1.022 1.005 - 1.030   pH 5.0 5.0 - 8.0   Glucose, UA >=500 (A) NEGATIVE mg/dL   Hgb urine dipstick MODERATE (A) NEGATIVE   Bilirubin Urine NEGATIVE NEGATIVE    Ketones, ur 5 (A) NEGATIVE mg/dL   Protein, ur NEGATIVE NEGATIVE mg/dL   Nitrite NEGATIVE NEGATIVE   Leukocytes, UA SMALL (A) NEGATIVE   RBC / HPF 0-5 0 - 5 RBC/hpf   WBC, UA 6-10 0 - 5 WBC/hpf   Bacteria, UA RARE (A) NONE SEEN   Squamous Epithelial / LPF 0-5 0 - 5    Comment: Performed at [facility], [place] [address], [place], [place] [address]  CBG monitoring, ED     Status: Abnormal   Collection Time: 12/23/17  5:18 AM  Result Value Ref Range   Glucose-Capillary >600 (HH) 70 - 99 mg/dL   Comment 1 Notify RN    Comment 2 Document in Chart   Beta-hydroxybutyric acid     Status: Abnormal   Collection Time:  12/23/17  5:43 AM  Result Value Ref Range   Beta-Hydroxybutyric Acid 0.96 (H) 0.05 - 0.27 mmol/L    Comment: Performed at [facility] [address] [place], [place] [address]  I-Stat venous blood gas, ED     Status: None   Collection Time: 12/23/17  6:28 AM  Result Value Ref Range   pH, Ven 7.301 7.250 - 7.430   pCO2, Ven 54.5 44.0 - 60.0 mmHg   pO2, Ven 35.0 32.0 - 45.0 mmHg   Bicarbonate 26.9 20.0 - 28.0 mmol/L   TCO2 29 22 - 32 mmol/L   O2 Saturation 60.0 %   Acid-base deficit 1.0 0.0 - 2.0 mmol/L   Patient temperature HIDE    Sample type VENOUS    Comment NOTIFIED PHYSICIAN   CBG monitoring, ED     Status: Abnormal   Collection Time: 12/23/17  7:07 AM  Result Value Ref Range   Glucose-Capillary 595 (HH) 70 - 99 mg/dL   Comment 1 Notify RN    Comment 2 Document in Chart   CBG monitoring, ED     Status: Abnormal   Collection Time: 12/23/17  8:22 AM  Result Value Ref Range   Glucose-Capillary 407 (H) 70 - 99 mg/dL  CBG monitoring, ED     Status: Abnormal   Collection Time: 12/23/17  9:31 AM  Result Value Ref Range   Glucose-Capillary 314 (H) 70 - 99 mg/dL  CBG monitoring, ED     Status: Abnormal   Collection Time: 12/23/17 10:42 AM  Result Value Ref Range   Glucose-Capillary 287 (H) 70 - 99 mg/dL  CBG monitoring, ED     Status: Abnormal    Collection Time: 12/23/17 12:04 PM  Result Value Ref Range   Glucose-Capillary 149 (H) 70 - 99 mg/dL  CBG monitoring, ED     Status: Abnormal   Collection Time: 12/23/17  1:50 PM  Result Value Ref Range   Glucose-Capillary 137 (H) 70 - 99 mg/dL   Comment 1 Notify RN    Comment 2 Document in Chart   Glucose, capillary     Status: Abnormal   Collection Time: 12/23/17  4:00 PM  Result Value Ref Range   Glucose-Capillary 227 (H) 70 - 99 mg/dL   Comment 1 Notify RN    Comment 2 Document in Chart       Component Value Date/Time   SDES URINE, RANDOM 07/28/2017 1148   SPECREQUEST NONE 07/28/2017 1148   CULT  07/28/2017 1148    NO GROWTH Performed at [facility], [place] [address] [place], [place] [address]    REPTSTATUS 07/29/2017 FINAL 07/28/2017 1148   No results found. No results found for this or any previous visit (from the past 240 hour(s)).  Microbiology: No results found for this or any previous visit (from the past 240 hour(s)).  Radiographs and labs were personally reviewed by me.   Bobby Rumpf, MD [facility] for Infectious Disease [facility] [phone] 12/23/2017, 4:31 PM

## 2017-12-23 NOTE — H&P (Signed)
History and Physical  Donald Wheeler ZHY:[id number] DOB: [birth year] DOA: 12/23/2017  Referring physician: ER physician. PCP: Patient, No Pcp Per  Outpatient Specialists: None Patient coming from: Home  Chief Complaint: Elevated blood sugar (greater than 900).  HPI:  Patient is a 47 year old male with past medical history significant for HIV, CVA, hypertension and chronic kidney disease stage III.  Patient reported that he had been feeling weak, tired, with associated polyuria and polydipsia.  Patient eventually decided to get to the primary care provider to be assessed.  Patient's primary care provider called patient to inform him to come to the hospital for further assessment and management as his blood sugar was significantly elevated.  On presentation to the hospital, the patient blood sugar was 988, but no anion gap.  No headache, no neck pain, no fever or chills, no URI symptoms, no chest pain, no shortness of breath, no GI symptoms.  Hospitalist service has been asked admit patient for further assessment and management.  ED Course: Patient was aggressively hydrated, and started on insulin drip.  Patient blood sugar has improved significantly. Pertinent labs: Venous blood gas revealed pH of 7.30, PCO2 of 55 and PO2 of 35.  BMP revealed sodium of 125, potassium of 5, chloride of 90, CO2 24, blood sugar of 988, BUN of 25 and creatinine of 1.75 (with baseline serum creatinine of 1.31).  Lipase was elevated at 122.  CBC revealed WBC of 5.7, hemoglobin 15.2, hematocrit of 47.1 with a platelet count of 161. EKG: Independently reviewed.  Imaging: independently reviewed.   Review of Systems: Negative for fever, visual changes, sore throat, rash, new muscle aches, chest pain, SOB, dysuria, bleeding, n/v/abdominal pain.  Past Medical History:  Diagnosis Date  . Bell's palsy 09/07/2013  . HIV (human immunodeficiency virus infection) ([facility])    dx'd 2000s  . Hypertension   . Stroke ([facility]) 04/09/2017   vs TIA w/acute onset of right-sided weakness as well as trouble speaking/notes 04/09/2017    Past Surgical History:  Procedure Laterality Date  . NO PAST SURGERIES       reports that he has been smoking cigars. He has a 3.00 pack-year smoking history. He has never used smokeless tobacco. He reports that he drinks alcohol. He reports that he has current or past drug history. Drug: Marijuana.  No Known Allergies  No family history on file.   Prior to Admission medications   Medication Sig Start Date End Date Taking? Authorizing Provider  amLODipine (NORVASC) 10 MG tablet Take 1 tablet (10 mg total) by mouth daily. 08/16/17  Yes Blanchard Kelch, NP  aspirin EC 81 MG tablet Take 1 tablet (81 mg total) by mouth daily. 07/13/17  Yes Blanchard Kelch, NP  atorvastatin (LIPITOR) 80 MG tablet Take 1 tablet (80 mg total) by mouth daily at 6 PM. 04/10/17 12/23/17 Yes Chundi, Vahini, MD  bictegravir-emtricitabine-tenofovir AF (BIKTARVY) 50-200-25 MG TABS tablet Take 1 tablet by mouth daily. 04/13/17  Yes Judyann Munson, MD    Physical Exam: Vitals:   12/23/17 0355 12/23/17 0530 12/23/17 0545 12/23/17 0615  BP:  (!) 153/97 (!) 164/99 137/87  Pulse:  (!) 50 (!) 54 62  Resp:    18  Temp:      SpO2:  98% 97% 96%  Weight: 90.7 kg     Height: 5\' 7"  (1.702 m)      Constitutional:  . Appears calm and comfortable Eyes:  . No pallor. No jaundice.  ENMT:  . external ears, nose  appear normal.  Dry tongue. Neck:  . Neck is supple. No JVD Respiratory:  . CTA bilaterally, no w/r/r.  . Respiratory effort normal. No retractions or accessory muscle use Cardiovascular:  . S1S2 . No LE extremity edema   Abdomen:  . Abdomen is soft and non tender. Organs are difficult to assess. Neurologic:  . Awake and alert. . Moves all limbs.  Wt Readings from Last 3 Encounters:  12/23/17 90.7 kg  08/16/17 94.3 kg  07/13/17 92.1 kg    I have personally reviewed following labs and imaging  studies  Labs on Admission:  CBC: Recent Labs  Lab 12/22/17 1335 12/23/17 0401  WBC 6.8 5.7  NEUTROABS 3,828  --   HGB 15.1 15.2  HCT 47.2 47.1  MCV 88.9 85.6  PLT 152 161   Basic Metabolic Panel: Recent Labs  Lab 12/22/17 1335 12/23/17 0401  NA 120* 125*  K 5.6* 5.0  CL 86* 90*  CO2 23 24  GLUCOSE 823* 988*  BUN 31* 25*  CREATININE 1.67* 1.75*  CALCIUM 9.0 9.0   Liver Function Tests: Recent Labs  Lab 12/22/17 1335 12/23/17 0401  AST 17 21  ALT 15 19  ALKPHOS  --  112  BILITOT 0.5 1.0  PROT 7.2 7.2  ALBUMIN  --  3.7   Recent Labs  Lab 12/23/17 0401  LIPASE 122*   No results for input(s): AMMONIA in the last 168 hours. Coagulation Profile: No results for input(s): INR, PROTIME in the last 168 hours. Cardiac Enzymes: No results for input(s): CKTOTAL, CKMB, CKMBINDEX, TROPONINI in the last 168 hours. BNP (last 3 results) No results for input(s): PROBNP in the last 8760 hours. HbA1C: No results for input(s): HGBA1C in the last 72 hours. CBG: Recent Labs  Lab 12/23/17 0518 12/23/17 0707 12/23/17 0822 12/23/17 0931  GLUCAP >600* 595* 407* 314*   Lipid Profile: No results for input(s): CHOL, HDL, LDLCALC, TRIG, CHOLHDL, LDLDIRECT in the last 72 hours. Thyroid Function Tests: No results for input(s): TSH, T4TOTAL, FREET4, T3FREE, THYROIDAB in the last 72 hours. Anemia Panel: No results for input(s): VITAMINB12, FOLATE, FERRITIN, TIBC, IRON, RETICCTPCT in the last 72 hours. Urine analysis:    Component Value Date/Time   COLORURINE STRAW (A) 12/23/2017 0513   APPEARANCEUR CLEAR 12/23/2017 0513   LABSPEC 1.022 12/23/2017 0513   PHURINE 5.0 12/23/2017 0513   GLUCOSEU >=500 (A) 12/23/2017 0513   HGBUR MODERATE (A) 12/23/2017 0513   BILIRUBINUR NEGATIVE 12/23/2017 0513   KETONESUR 5 (A) 12/23/2017 0513   PROTEINUR NEGATIVE 12/23/2017 0513   UROBILINOGEN 0.2 07/28/2017 1108   NITRITE NEGATIVE 12/23/2017 0513   LEUKOCYTESUR SMALL (A) 12/23/2017 0513    Sepsis Labs: @LABRCNTIP (procalcitonin:4,lacticidven:4) )No results found for this or any previous visit (from the past 240 hour(s)).    Radiological Exams on Admission: No results found.  EKG: Independently reviewed.   Active Problems:   Hyperosmolar non-ketotic state in patient with type 2 diabetes mellitus (HCC)   Assessment/Plan New onset diabetes mellitus: Wean off insulin drip. Start long acting insulin (subcutaneous Lantus 10 units twice daily) Check blood sugar every 1 hour for the next 4 hours, and then change to every 4 hours Low-dose sliding scale insulin coverage Follow A1c Fasting lipid profile Aspirin Diabetic education Continue to hydrate patient. Further management will depend on hospital records  HIV: Continue home meds.  Hypertension: Optimized Continue to monitor.  CKD 3: Stable.  DVT prophylaxis: Subcu Lovenox Code Status: Full code Family Communication:  Disposition Plan:  Home eventually Consults called: Diabetic educator Admission status: Observation  Time spent: 60 minutes  Berton Mount, MD  [facility] Pager #: [phone] 7PM-7AM contact night coverage as above  12/23/2017, 10:05 AM

## 2017-12-23 NOTE — ED Notes (Signed)
Consulted with diabetic education concerning patient care plan. Lantus ordered from pharmacy.

## 2017-12-23 NOTE — ED Notes (Signed)
Dr. Blinda Leatherwood aware Glucose 988

## 2017-12-23 NOTE — ED Triage Notes (Signed)
The pt saw his doctor yesterday in the office  He was called 0100am at home and was told to come to the hospital to be seen

## 2017-12-23 NOTE — ED Triage Notes (Signed)
Pt c/o abd pain and constipation  

## 2017-12-23 NOTE — ED Provider Notes (Signed)
[facility] EMERGENCY DEPARTMENT Provider Note   CSN: [id number] Arrival date & time: 12/23/17  0330     History   Chief Complaint Chief Complaint  Patient presents with  . Abnormal Lab    HPI Donald Wheeler is a 47 y.o. male with past medical history of HIV, stroke, hypertension, CKD 3, who presents today for evaluation of an abnormal lab.  He presented to infectious disease office yesterday reported generally not feeling well, labs were obtained and at 1 AM he reportedly got a call telling him to come to the emergency room.  His labs from yesterday showed a markedly elevated glucose with hyponatremia.  He reports no true abdominal pain, no nausea vomiting or diarrhea.  He notes 1 week of marked polyuria and polydipsia.  He denies increased urgency or pain with urination.  He has no history of diabetes.  No recent medication changes.    HPI  Past Medical History:  Diagnosis Date  . Bell's palsy 09/07/2013  . HIV (human immunodeficiency virus infection) (HCC)    dx'd 2000s  . Hypertension   . Stroke (HCC) 04/09/2017   vs TIA w/acute onset of right-sided weakness as well as trouble speaking/notes 04/09/2017    Patient Active Problem List   Diagnosis Date Noted  . Hyperosmolar non-ketotic state in patient with type 2 diabetes mellitus (HCC) 12/23/2017  . History of hematuria 08/16/2017  . Mixed hyperlipidemia 06/16/2017  . 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 Solitary pulmonary nodule 04/10/2017  . Focal neurological deficit 04/09/2017  . CKD (chronic kidney disease) stage 3, GFR 30-59 ml/min (HCC) 04/09/2017  . Smoker 04/09/2017  . HTN (hypertension), benign 09/07/2013  . HIV (human immunodeficiency virus infection) (HCC) 03/17/2012    Past Surgical History:  Procedure Laterality Date  . NO PAST SURGERIES          Home Medications    Prior to Admission medications   Medication Sig Start Date End Date Taking? Authorizing  Provider  amLODipine (NORVASC) 10 MG tablet Take 1 tablet (10 mg total) by mouth daily. 08/16/17  Yes Blanchard Kelch, NP  aspirin EC 81 MG tablet Take 1 tablet (81 mg total) by mouth daily. 07/13/17  Yes Blanchard Kelch, NP  atorvastatin (LIPITOR) 80 MG tablet Take 1 tablet (80 mg total) by mouth daily at 6 PM. 04/10/17 12/23/17 Yes Chundi, Vahini, MD  bictegravir-emtricitabine-tenofovir AF (BIKTARVY) 50-200-25 MG TABS tablet Take 1 tablet by mouth daily. 04/13/17  Yes Judyann Munson, MD    Family History No family history on file.  Social History Social History   Tobacco Use  . Smoking status: Current Every Day Smoker    Packs/day: 0.20    Years: 15.00    Pack years: 3.00    Types: Cigars  . Smokeless tobacco: Never Used  . Tobacco comment: black and mildsl daily  Substance Use Topics  . Alcohol use: Yes    Comment: 04/09/2017 "might have 2 beers/month; if that"  . Drug use: Yes    Types: Marijuana    Comment: 04/09/2017 "maybe 10 times/year"     Allergies   Patient has no known allergies.   Review of Systems Review of Systems  Constitutional: Negative for chills and fever.  HENT: Negative for ear pain and sore throat.   Eyes: Negative for pain and visual disturbance.  Respiratory: Negative for cough and shortness of breath.   Cardiovascular: Negative for chest pain and palpitations.  Gastrointestinal: Negative for abdominal pain,  diarrhea, nausea and vomiting.  Endocrine: Positive for polydipsia and polyuria.  Genitourinary: Positive for frequency. Negative for dysuria, hematuria and urgency.  Musculoskeletal: Negative for arthralgias and back pain.  Skin: Negative for color change and rash.  Neurological: Negative for seizures, syncope, weakness and headaches.  All other systems reviewed and are negative.    Physical Exam Updated Vital Signs BP 137/87 (BP Location: Right Arm)   Pulse 62   Temp 98.8 F (37.1 C)   Resp 18   Ht 5\' 7"  (1.702 m)   Wt 90.7 kg    SpO2 96%   BMI 31.32 kg/m   Physical Exam  Constitutional: He appears well-developed and well-nourished.  HENT:  Head: Normocephalic and atraumatic.  Mouth/Throat: Oropharynx is clear and moist.  Eyes: Conjunctivae are normal.  Neck: Neck supple.  Cardiovascular: Normal rate, regular rhythm and normal heart sounds.  No murmur heard. Pulmonary/Chest: Effort normal and breath sounds normal. No respiratory distress.  Abdominal: Soft. Bowel sounds are normal. He exhibits no distension. There is no tenderness. There is no guarding.  Musculoskeletal: He exhibits no edema.  Neurological: He is alert.  Skin: Skin is warm and dry.  Psychiatric: He has a normal mood and affect.  Nursing note and vitals reviewed.    ED Treatments / Results  Labs (all labs ordered are listed, but only abnormal results are displayed) Labs Reviewed  LIPASE, BLOOD - Abnormal; Notable for the following components:      Result Value   Lipase 122 (*)    All other components within normal limits  COMPREHENSIVE METABOLIC PANEL - Abnormal; Notable for the following components:   Sodium 125 (*)    Chloride 90 (*)    Glucose, Bld 988 (*)    BUN 25 (*)    Creatinine, Ser 1.75 (*)    GFR calc non Af Amer 44 (*)    GFR calc Af Amer 51 (*)    All other components within normal limits  URINALYSIS, ROUTINE W REFLEX MICROSCOPIC - Abnormal; Notable for the following components:   Color, Urine STRAW (*)    Glucose, UA >=500 (*)    Hgb urine dipstick MODERATE (*)    Ketones, ur 5 (*)    Leukocytes, UA SMALL (*)    Bacteria, UA RARE (*)    All other components within normal limits  BETA-HYDROXYBUTYRIC ACID - Abnormal; Notable for the following components:   Beta-Hydroxybutyric Acid 0.96 (*)    All other components within normal limits  CBG MONITORING, ED - Abnormal; Notable for the following components:   Glucose-Capillary >600 (*)    All other components within normal limits  CBG MONITORING, ED - Abnormal;  Notable for the following components:   Glucose-Capillary 595 (*)    All other components within normal limits  URINE CULTURE  CBC  I-STAT VENOUS BLOOD GAS, ED    EKG None  Radiology No results found.  Procedures Procedures (including critical care time)  CRITICAL CARE Performed by: Lyndel Safe Total critical care time: 40 minutes Critical care time was exclusive of separately billable procedures and treating other patients. Critical care was necessary to treat or prevent imminent or life-threatening deterioration. Critical care was time spent personally by me on the following activities: development of treatment plan with patient and/or surrogate as well as nursing, discussions with consultants, evaluation of patient's response to treatment, examination of patient, obtaining history from patient or surrogate, ordering and performing treatments and interventions, ordering and review of laboratory studies, ordering  and review of radiographic studies, pulse oximetry and re-evaluation of patient's condition.   Medications Ordered in ED Medications  insulin regular, human (MYXREDLIN) 100 units/ 100 mL infusion (10.7 Units/hr Intravenous Rate/Dose Change 12/23/17 0709)  sodium chloride 0.9 % bolus 1,000 mL (0 mLs Intravenous Stopped 12/23/17 0628)  sodium chloride 0.9 % bolus 1,000 mL (0 mLs Intravenous Stopped 12/23/17 0628)  0.9 %  sodium chloride infusion ( Intravenous New Bag/Given 12/23/17 1610)     Initial Impression / Assessment and Plan / ED Course  I have reviewed the triage vital signs and the nursing notes.  Pertinent labs & imaging results that were available during my care of the patient were reviewed by me and considered in my medical decision making (see chart for details).  Clinical Course as of Dec 23 753  Thu Dec 23, 2017  9604 Spoke with Dr. Toniann Fail who will have    [EH]    Clinical Course User Index [EH] Cristina Gong, PA-C   [name] presents today for evaluation of an abnormal lab.  He was generally not feeling well with polydipsia and polyuria, went to his PCP yesterday at infectious diseases who took blood work and called him that he had hyponatremia and a marked hyperglycemia.  He does not have any known history of diabetes, his urine only has 5 ketones with moderate blood small leukocytes rare bacteria.  His glucose was elevated at 988 with a sodium of 125.  Creatinine is consistent with his baseline.  Lipase is mildly elevated at 122, however he does not have any abdominal pain that would be consistent with pancreatitis.  His white count is not elevated.  Beta hydroxybutyric acid was elevated at 0.95.  His urine is questionably infected, however aside from polydipsia and polyuria he does not have any urinary symptoms, therefore sent for culture.  VBG was obtained which did not show any evidence of acidosis.  Patient was started on glucose stabilizer, given 2 L of IV fluids bolus and started on a saline drip.    This patient was seen as a shared visit with Dr. Elesa Massed.  I spoke with Dr. Toniann Fail who agreed to admit the patient.   Final Clinical Impressions(s) / ED Diagnoses   Final diagnoses:  Hyperglycemia  Hyponatremia    ED Discharge Orders    None       Cristina Gong, PA-C 12/23/17 5409    Ward, Layla Maw, DO 12/28/17 0031

## 2017-12-23 NOTE — ED Notes (Signed)
Pt VBG results given to Dr. Elesa Massed & Elizabeth(PA)

## 2017-12-23 NOTE — Care Management Note (Addendum)
Case Management Note  Patient Details  Name: Donald Wheeler MRN: [id number] Date of Birth: 17-Apr-1970  Subjective/Objective:   47 y.o. male presented for evaluation of an abnormal lab. PMH:  HIV, stroke, hypertension, CKD 3               Action/Plan: CM consult acknowledged; patient newly DM diagnosis. CM met with patient to discuss transitional needs. Patient lives at home, independent with ADLs with no DME in use PTA. Patient confirmed no PCP or insurance; patient agreeable to CM assistance in arranging a hospital f/u appt. Patient followed by Dr. Baxter Flattery at [facility] for Infectious Disease for HIV management. CM contact [facility] with appt arranged: December 30, 2017 @ 1300; AVS updated. Patient can utilize [facility] for Rx needs; patient indicated to CM his ability to afford $4-10 Rx at [facility]. Will use the [facility] service for patient Rx prior to discharge. CM will continue to follow.   Expected Discharge Date:                  Expected Discharge Plan:  Home/Self Care  In-House Referral:  NA  Discharge planning Services  CM Consult, Follow-up appt scheduled, Medication Assistance, [facility]  Post Acute Care Choice:  NA Choice offered to:  NA  DME Arranged:  N/A DME Agency:  NA  HH Arranged:  NA HH Agency:  NA  Status of Service:  In process, will continue to follow  If discussed at Long Length of Stay Meetings, dates discussed:    Additional Comments: 12/24/17 @ 1441-Arlone Lenhardt [place] for Rxs with the [employer]. Rx will be delivered to patient prior to transitioning home. No further needs from CM at this time.   Midge Minium RN, BSN, NCM-BC, ACM-RN [phone] 12/23/2017, 3:32 PM

## 2017-12-23 NOTE — Progress Notes (Signed)
Called pt to let him know he needs to got to Ed for elevated glc and lower sodium He agrees

## 2017-12-23 NOTE — Progress Notes (Addendum)
Inpatient Diabetes Program Recommendations  AACE/ADA: New Consensus Statement on Inpatient Glycemic Control (2015)  Target Ranges:  Prepandial:   less than 140 mg/dL      Peak postprandial:   less than 180 mg/dL (1-2 hours)      Critically ill patients:  140 - 180 mg/dL   Lab Results  Component Value Date   GLUCAP 287 (H) 12/23/2017   HGBA1C 6.5 (H) 08/16/2017    Review of Glycemic Control Results for Donald Wheeler, Donald Wheeler (MRN [id number]) as of 12/23/2017 10:40  Ref. Range 12/23/2017 05:18 12/23/2017 07:07 12/23/2017 08:22 12/23/2017 09:31 12/23/2017 10:42  Glucose-Capillary Latest Ref Range: 70 - 99 mg/dL >409 (HH) 811 (HH) 914 (H) 314 (H) 287 (H)   Diabetes history: new onset Outpatient Diabetes medications: none Current orders for Inpatient glycemic control: insulin drip to transition to Lantus 10 units BID, Novolog 0-9 units Q4H  Inpatient Diabetes Program Recommendations:    Noted last A1C was 6.5% in June, 2019. No oral meds or education was provided per chart review. A1C has been ordered and is processing.   Discussed plan of care with Jill Alexanders, RN. Patient to transition off of drip. Current BS was 287 mg/dL. Per AM CMET CO2 24 and AG 11. Encouraged RN to admin Lantus now and continue with insulin drip for at least 1-2 hours under the glucostabilizer protocol.   Ordered LWWDM book, and placed consults for case management and dietitian. Will plan to see patient today.  Thanks, Lujean Rave, MSN, RNC-OB Diabetes Coordinator [phone] (8a-5p)  Addendum@1500 : Spoke with patient regarding new diagnosis. Patient having a difficult time accepting this diagnosis. Per patient, "I don't do needles." Reviewed patient's previous A1c of 6.5% back in June, 2019. Informed patient that another A1C was being processed and I anticipated the results to be higher. Reviewed ADA guidelines for DM diagnosis. Explained what a A1c is and what it measures. Also reviewed goal A1c with patient,  importance of good glucose control @ home, and blood sugar goals. Reviewed briefly patho of DM, role of pancreas, need for insulin, and long term comorbidites.   Reviewed symptoms of hyperglycemia. Patient admits to polyuria and polydipsia.  Reviewed how to check blood sugars and encouraged patient to begin participating in overcoming fear of self sticking. Discussed the importance of checking 3 times per day.  Reviewed insulin's role to the body. Discussed HHNK vs. DKA.  Briefly reviewed diet. Per patient, "I eat whatever I want, whenever I want."   Patient appears overwhelmed. Will plan to revisit topic and give patient time to process.  Encouraged patient to begin reviewing LWWDM book, formulating questions, and practicing with CBGs and self injections.  Patient has no questions at this time. Will plan to see again on 10/11 once A1C is processed.  Thanks, Lujean Rave, MSN, RNC-OB Diabetes Coordinator [phone] (8a-5p)

## 2017-12-24 LAB — BASIC METABOLIC PANEL
Anion gap: 8 (ref 5–15)
BUN: 14 mg/dL (ref 6–20)
CO2: 26 mmol/L (ref 22–32)
Calcium: 8.2 mg/dL — ABNORMAL LOW (ref 8.9–10.3)
Chloride: 103 mmol/L (ref 98–111)
Creatinine, Ser: 1.13 mg/dL (ref 0.61–1.24)
GFR calc Af Amer: >60 mL/min (ref >60)
GFR calc non Af Amer: >60 mL/min (ref >60)
Glucose, Bld: 258 mg/dL — ABNORMAL HIGH (ref 70–99)
Potassium: 3.7 mmol/L (ref 3.5–5.1)
Sodium: 137 mmol/L (ref 135–145)

## 2017-12-24 LAB — CBC WITH DIFFERENTIAL/PLATELET
Basophils Absolute: 41 cells/uL (ref 0–200)
Basophils Relative: 0.6 %
EOS ABS: 61 {cells}/uL (ref 15–500)
Eosinophils Relative: 0.9 %
HEMATOCRIT: 47.2 % (ref 38.5–50.0)
Hemoglobin: 15.1 g/dL (ref 13.2–17.1)
LYMPHS ABS: 2292 {cells}/uL (ref 850–3900)
MCH: 28.4 pg (ref 27.0–33.0)
MCHC: 32 g/dL (ref 32.0–36.0)
MCV: 88.9 fL (ref 80.0–100.0)
Monocytes Relative: 8.5 %
NEUTROS PCT: 56.3 %
Neutro Abs: 3828 cells/uL (ref 1500–7800)
Platelets: 152 10*3/uL (ref 140–400)
RBC: 5.31 10*6/uL (ref 4.20–5.80)
RDW: 13.1 % (ref 11.0–15.0)
Total Lymphocyte: 33.7 %
WBC: 6.8 10*3/uL (ref 3.8–10.8)
WBCMIX: 578 {cells}/uL (ref 200–950)

## 2017-12-24 LAB — COMPREHENSIVE METABOLIC PANEL
AG Ratio: 1.1 (calc) (ref 1.0–2.5)
ALT: 15 U/L (ref 9–46)
AST: 17 U/L (ref 10–40)
Albumin: 3.7 g/dL (ref 3.6–5.1)
Alkaline phosphatase (APISO): 120 U/L — ABNORMAL HIGH (ref 40–115)
BILIRUBIN TOTAL: 0.5 mg/dL (ref 0.2–1.2)
BUN/Creatinine Ratio: 19 (calc) (ref 6–22)
BUN: 31 mg/dL — AB (ref 7–25)
CALCIUM: 9 mg/dL (ref 8.6–10.3)
CHLORIDE: 86 mmol/L — AB (ref 98–110)
CO2: 23 mmol/L (ref 20–32)
CREATININE: 1.67 mg/dL — AB (ref 0.60–1.35)
GLOBULIN: 3.5 g/dL (ref 1.9–3.7)
Glucose, Bld: 823 mg/dL (ref 65–99)
POTASSIUM: 5.6 mmol/L — AB (ref 3.5–5.3)
Sodium: 120 mmol/L — CL (ref 135–146)
Total Protein: 7.2 g/dL (ref 6.1–8.1)

## 2017-12-24 LAB — GLUCOSE, CAPILLARY
Glucose-Capillary: 223 mg/dL — ABNORMAL HIGH (ref 70–99)
Glucose-Capillary: 196 mg/dL — ABNORMAL HIGH (ref 70–99)
Glucose-Capillary: 358 mg/dL — ABNORMAL HIGH (ref 70–99)
Glucose-Capillary: 312 mg/dL — ABNORMAL HIGH (ref 70–99)

## 2017-12-24 LAB — HIV-1 RNA QUANT-NO REFLEX-BLD
HIV 1 RNA Quant: 36 copies/mL — ABNORMAL HIGH
HIV-1 RNA QUANT, LOG: 1.56 {Log_copies}/mL — AB

## 2017-12-24 MED ORDER — INSULIN GLARGINE 100 UNIT/ML SOLOSTAR PEN: 26.0000 [IU] | PEN_INJECTOR | Freq: Every day | SUBCUTANEOUS | 11 refills | Status: DC

## 2017-12-24 MED ORDER — INSULIN GLARGINE 100 UNIT/ML SOLOSTAR PEN: 25.0000 [IU] | PEN_INJECTOR | Freq: Every day | SUBCUTANEOUS | 11 refills | Status: DC

## 2017-12-24 MED ORDER — INSULIN DETEMIR 100 UNIT/ML FLEXPEN: 25.0000 [IU] | PEN_INJECTOR | Freq: Every day | SUBCUTANEOUS | 0 refills | Status: DC

## 2017-12-24 MED ORDER — INSULIN ASPART 100 UNIT/ML ~~LOC~~ SOLN: 0.0000 [IU] | Freq: Every day | SUBCUTANEOUS | Status: DC

## 2017-12-24 MED ORDER — BLOOD GLUCOSE MONITOR KIT: PACK | 0 refills | Status: DC

## 2017-12-24 MED ORDER — INSULIN STARTER KIT- PEN NEEDLES (ENGLISH)
1.0000 | Freq: Once | Status: AC
  Administered 2017-12-24: 1
  Filled 2017-12-24 (×2): qty 1

## 2017-12-24 MED ORDER — INSULIN ASPART 100 UNIT/ML ~~LOC~~ SOLN
0.0000 [IU] | Freq: Three times a day (TID) | SUBCUTANEOUS | Status: DC
  Administered 2017-12-24: 15 [IU] via SUBCUTANEOUS
  Filled 2017-12-24: qty 1

## 2017-12-24 MED FILL — PENTIPS 31G X 8 MM MISC: 31G X 8 MM | 100 days supply | Qty: 100 | Fill #0

## 2017-12-24 MED FILL — LEVEMIR FLEXTOUCH 100 UNITS: 100 | 30 days supply | Qty: 15 | Fill #0

## 2017-12-24 NOTE — Progress Notes (Addendum)
Inpatient Diabetes Program Recommendations  AACE/ADA: New Consensus Statement on Inpatient Glycemic Control (2015)  Target Ranges:  Prepandial:   less than 140 mg/dL      Peak postprandial:   less than 180 mg/dL (1-2 hours)      Critically ill patients:  140 - 180 mg/dL   Lab Results  Component Value Date   GLUCAP 196 (H) 12/24/2017   HGBA1C 13.8 (H) 12/23/2017    Review of Glycemic Control Results for MADYX, DELFIN (MRN [id number]) as of 12/24/2017 08:49  Ref. Range 12/23/2017 19:51 12/23/2017 23:09 12/24/2017 03:39 12/24/2017 08:05  Glucose-Capillary Latest Ref Range: 70 - 99 mg/dL 333 (H) 362 (H) 223 (H) 196 (H)   Diabetes history: new onset Outpatient Diabetes medications: none Current orders for Inpatient glycemic control: Lantus 10 units BID, Novolog 0-9 units TID, Novolog 0-5 units QHS  Inpatient Diabetes Program Recommendations:    Noted patient did not receive first dose of Lantus 10 units with transition off of insulin drip. Thus, BS trended up to 300's mg/dL.   For discharge: Consider adding Lantus pens 25 units QD.   Will plan to see patient.   Thanks, Bronson Curb, MSN, RNC-OB Diabetes Coordinator [phone] (8a-5p)  Addendum @1500 : Spoke again with patient at length regarding DM diagnosis. Patient seems more accepting today. Reviewed patient's current A1c of 13.8%. Explained what a A1c is and what it measures. Also reviewed goal A1c with patient, importance of good glucose control @ home, and blood sugar goals. Reviewed again patho of DM, need for insulin, how our body uses and processes glucose, HHNK vs. DKA, survival skills, vascular changes and comorbidites to include: CKD, blindness, amputations, MI, Stroke, and neuropathies. Reviewed guidelines for ADA recommendations for increasing activity and the impact this poses to BS levels. Briefly reviewed total carbohydrates per meal and reviewed information provided by dietitian. Patient has been reviewing  LWWDM book with RN and is working towards self injections.  Reviewed again how to check blood sugars using a meter. Patient is to pick up supplies for this at [facility] today following DC. He verbalizes understanding. Relion information also provided to patient if he needs additional supplies. Encouraged to check 3 times a day and take meter with him to his appointment next week. Reviewed target range for normal BS and when to call MD.  Discussed hyperglycemia and hypoglycemic signs and symptoms, and interventions associated. Patient able to verbalize. Educated patient on insulin pen use at home. Reviewed contents of insulin flexpen starter kit. Reviewed all steps if insulin pen including attachment of needle, 2-unit air shot, dialing up dose, giving injection, removing needle, disposal of sharps, storage of unused insulin, disposal of insulin etc. Patient able to provide successful return demonstration. Also reviewed troubleshooting with insulin pen. MD to give patient Rxs for insulin pens and insulin pen needles.  At this time patient has no further questions or concerns. LM

## 2017-12-24 NOTE — Telephone Encounter (Signed)
Good call. His BS was in the 800s and subsequently admitted

## 2017-12-24 NOTE — Progress Notes (Signed)
   INFECTIOUS DISEASE PROGRESS NOTE  ID: Donald Wheeler is a 47 y.o. male with  Active Problems:   Hyperosmolar non-ketotic state in patient with type 2 diabetes mellitus (HCC)   Diabetes mellitus, new onset (HCC)  Subjective: No complaints  Abtx:  Anti-infectives (From admission, onward)   Start     Dose/Rate Route Frequency Ordered Stop   12/23/17 1445  bictegravir-emtricitabine-tenofovir AF (BIKTARVY) 50-200-25 MG per tablet 1 tablet     1 tablet Oral Daily 12/23/17 1433        Medications:  Scheduled: . amLODipine  10 mg Oral Daily  . aspirin EC  81 mg Oral Daily  . atorvastatin  80 mg Oral q1800  . bictegravir-emtricitabine-tenofovir AF  1 tablet Oral Daily  . enoxaparin (LOVENOX) injection  40 mg Subcutaneous Q24H  . insulin aspart  0-9 Units Subcutaneous Q4H  . insulin glargine  10 Units Subcutaneous BID    Objective: Vital signs in last 24 hours: Temp:  [97.9 F (36.6 C)-98.7 F (37.1 C)] 97.9 F (36.6 C) (10/11 1019) Pulse Rate:  [54-65] 65 (10/11 1019) Resp:  [14-18] 18 (10/11 1019) BP: (114-138)/(70-92) 138/90 (10/11 1019) SpO2:  [96 %-100 %] 99 % (10/11 1019)   General appearance: alert, cooperative and no distress Resp: clear to auscultation bilaterally Cardio: regular rate and rhythm GI: normal findings: bowel sounds normal and soft, non-tender  Lab Results Recent Labs    12/23/17 0401 12/23/17 1540 12/24/17 0449  WBC 5.7 6.8  --   HGB 15.2 13.5  --   HCT 47.1 40.1  --   NA 125* 140 137  K 5.0 4.0 3.7  CL 90* 107 103  CO2 24 25 26   BUN 25* 13 14  CREATININE 1.75* 1.26* 1.13   Liver Panel Recent Labs    12/22/17 1335 12/23/17 0401  PROT 7.2 7.2  ALBUMIN  --  3.7  AST 17 21  ALT 15 19  ALKPHOS  --  112  BILITOT 0.5 1.0   Sedimentation Rate No results for input(s): ESRSEDRATE in the last 72 hours. C-Reactive Protein No results for input(s): CRP in the last 72 hours.  Microbiology: Recent Results (from the past 240 hour(s))    Urine culture     Status: None (Preliminary result)   Collection Time: 12/23/17  6:30 AM  Result Value Ref Range Status   Specimen Description URINE, RANDOM  Final   Special Requests NONE  Final   Culture   Final    CULTURE REINCUBATED FOR BETTER GROWTH Performed at [facility], [address], [place], [place] [address]    Report Status PENDING  Incomplete    Studies/Results: No results found.   Assessment/Plan: HIV+ Newly Dx DM2  Total days of antibiotics: 0  Glc still elevated, better than adm.  Will need IM f/u Please continue biktarvy PT HAS NOT DISCLOSED TO FAMILY  Available as needed.          Johny Sax MD, FACP Infectious Diseases (pager) [phone] [email or web address] 12/24/2017, 10:39 AM  LOS: 0 days

## 2017-12-24 NOTE — Progress Notes (Signed)
Pt verbalizes understanding fo discharge teaching.  He will do his best to monitor his glucose levels, will pick up medications, change his diet and follow up with his appointments.

## 2017-12-24 NOTE — Discharge Instructions (Signed)

## 2017-12-24 NOTE — Discharge Summary (Signed)
Physician Discharge Summary  Donald Wheeler OYD:[id number] DOB: 01-07-1971 DOA: 12/23/2017  PCP: Patient, No Pcp Per-- follow up arrange with community health [facility] wellness  Admit date: 12/23/2017 Discharge date: 12/24/2017  Admitted From: home Discharge disposition: home   Recommendations for Outpatient Follow-Up:   1. Close monitoring of his blood sugars and adjustments of medication as an outpatient--? Adding metformin 2. Follow urine culture to final   Discharge Diagnosis:   Active Problems:   Hyperosmolar non-ketotic state in patient with type 2 diabetes mellitus ([place])   Diabetes mellitus, new onset ([place])    Discharge Condition: Improved.  Diet recommendation: Low sodium, heart healthy.  Carbohydrate-modified.  Wound care: None.  Code status: Full.   History of Present Illness:    Patient is a 47 year old male with past medical history significant for HIV, CVA, hypertension and chronic kidney disease stage III.  Patient reported that he had been feeling weak, tired, with associated polyuria and polydipsia.  Patient eventually decided to get to the primary care provider to be assessed.  Patient's primary care provider called patient to inform him to come to the hospital for further assessment and management as his blood sugar was significantly elevated.  On presentation to the hospital, the patient blood sugar was 988, but no anion gap.  No headache, no neck pain, no fever or chills, no URI symptoms, no chest pain, no shortness of breath, no GI symptoms.  Hospitalist service has been asked admit patient for further assessment and management.  Hospital Course by Problem:   New onset diabetes mellitus: A1C> 10 -started levemir pen-- 25 units daily -diabetic education regarding pen use and diet (patient to cut out sodas/alcohol)  HIV: Continue home meds.  Hypertension: Resume home meds  AKI -resolved with IVF    Medical Consultants:    ID   Discharge Exam:   Vitals:   12/24/17 1019 12/24/17 1159  BP: 138/90 132/82  Pulse: 65 64  Resp: 18 16  Temp: 97.9 F (36.6 C) 97.7 F (36.5 C)  SpO2: 99% 99%   Vitals:   12/23/17 2315 12/24/17 0554 12/24/17 1019 12/24/17 1159  BP: 117/73 114/70 138/90 132/82  Pulse: (!) 58 (!) 54 65 64  Resp: 16 16 18 16   Temp: 98.7 F (37.1 C) 98.6 F (37 C) 97.9 F (36.6 C) 97.7 F (36.5 C)  TempSrc: Oral Oral Oral Oral  SpO2: 100% 100% 99% 99%  Weight:      Height:        General exam: Appears calm and comfortable.   The results of significant diagnostics from this hospitalization (including imaging, microbiology, ancillary and laboratory) are listed below for reference.     Procedures and Diagnostic Studies:   No results found.   Labs:   Basic Metabolic Panel: Recent Labs  Lab 12/22/17 1335 12/23/17 0401 12/23/17 1540 12/24/17 0449  NA 120* 125* 140 137  K 5.6* 5.0 4.0 3.7  CL 86* 90* 107 103  CO2 23 24 25 26   GLUCOSE 823* 988* 260* 258*  BUN 31* 25* 13 14  CREATININE 1.67* 1.75* 1.26* 1.13  CALCIUM 9.0 9.0 8.5* 8.2*  MG  --   --  2.7*  --   PHOS  --   --  4.3  --    GFR Estimated Creatinine Clearance: 84.9 mL/min (by C-G formula based on SCr of 1.13 mg/dL). Liver Function Tests: Recent Labs  Lab 12/22/17 1335 12/23/17 0401  AST 17 21  ALT 15  19  ALKPHOS  --  112  BILITOT 0.5 1.0  PROT 7.2 7.2  ALBUMIN  --  3.7   Recent Labs  Lab 12/23/17 0401  LIPASE 122*   No results for input(s): AMMONIA in the last 168 hours. Coagulation profile No results for input(s): INR, PROTIME in the last 168 hours.  CBC: Recent Labs  Lab 12/22/17 1335 12/23/17 0401 12/23/17 1540  WBC 6.8 5.7 6.8  NEUTROABS 3,828  --   --   HGB 15.1 15.2 13.5  HCT 47.2 47.1 40.1  MCV 88.9 85.6 83.0  PLT 152 161 139*   Cardiac Enzymes: No results for input(s): CKTOTAL, CKMB, CKMBINDEX, TROPONINI in the last 168 hours. BNP: Invalid input(s): POCBNP CBG: Recent  Labs  Lab 12/23/17 1951 12/23/17 2309 12/24/17 0339 12/24/17 0805 12/24/17 1203  GLUCAP 333* 362* 223* 196* 358*   D-Dimer No results for input(s): DDIMER in the last 72 hours. Hgb A1c Recent Labs    12/23/17 1540  HGBA1C 13.8*   Lipid Profile Recent Labs    12/23/17 0401  CHOL 226*  HDL 35*  LDLCALC 141*  TRIG 252*  CHOLHDL 6.5   Thyroid function studies No results for input(s): TSH, T4TOTAL, T3FREE, THYROIDAB in the last 72 hours.  Invalid input(s): FREET3 Anemia work up No results for input(s): VITAMINB12, FOLATE, FERRITIN, TIBC, IRON, RETICCTPCT in the last 72 hours. Microbiology Recent Results (from the past 240 hour(s))  Urine culture     Status: None (Preliminary result)   Collection Time: 12/23/17  6:30 AM  Result Value Ref Range Status   Specimen Description URINE, RANDOM  Final   Special Requests NONE  Final   Culture   Final    CULTURE REINCUBATED FOR BETTER GROWTH Performed at [facility], [address] [place], [place] [address]    Report Status PENDING  Incomplete     Discharge Instructions:   Discharge Instructions    Diet - low sodium heart healthy   Complete by:  As directed    Diet Carb Modified   Complete by:  As directed    Discharge instructions   Complete by:  As directed    Bring log of blood sugars to PCP STOP SODA/SUGARY DRINKS   Increase activity slowly   Complete by:  As directed      Allergies as of 12/24/2017   No Known Allergies     Medication List    TAKE these medications   amLODipine 10 MG tablet Commonly known as:  NORVASC Take 1 tablet (10 mg total) by mouth daily.   aspirin EC 81 MG tablet Take 1 tablet (81 mg total) by mouth daily.   atorvastatin 80 MG tablet Commonly known as:  LIPITOR Take 1 tablet (80 mg total) by mouth daily at 6 PM.   bictegravir-emtricitabine-tenofovir AF 50-200-25 MG Tabs tablet Commonly known as:  BIKTARVY Take 1 tablet by mouth daily.   blood glucose meter kit  and supplies Kit Dispense based on patient and insurance preference. Use up to four times daily as directed. (FOR ICD-9 250.00, 250.01).   Insulin Detemir 100 UNIT/ML Pen Commonly known as:  LEVEMIR Inject 25 Units into the skin daily.      Follow-up [place]. Go on 12/30/2017.   Specialty:  Internal Medicine Why:  @ 1pm Contact information: [facility]  [facility] Follow up.   Why:  for your pharmacy needs. Medications $4-10 Contact information: [address] [place] [address] [phone]           Time coordinating discharge: 25 min  Signed:  Geradine Girt  [facility] 12/24/2017, 2:09 PM

## 2017-12-28 ENCOUNTER — Telehealth: Payer: Self-pay

## 2017-12-28 NOTE — Telephone Encounter (Signed)
Called and spoke with patient 12/30/2017

## 2017-12-30 ENCOUNTER — Encounter: Payer: Self-pay | Admitting: Family Medicine

## 2017-12-30 ENCOUNTER — Ambulatory Visit (INDEPENDENT_AMBULATORY_CARE_PROVIDER_SITE_OTHER): Payer: Self-pay | Admitting: Family Medicine

## 2017-12-30 VITALS — BP 134/86 | HR 84 | Temp 98.6°F | Ht 67.0 in | Wt 192.0 lb

## 2017-12-30 DIAGNOSIS — R7309 Other abnormal glucose: Secondary | ICD-10-CM

## 2017-12-30 DIAGNOSIS — R8290 Unspecified abnormal findings in urine: Secondary | ICD-10-CM

## 2017-12-30 DIAGNOSIS — E119 Type 2 diabetes mellitus without complications: Secondary | ICD-10-CM

## 2017-12-30 LAB — POCT URINALYSIS DIP (MANUAL ENTRY)
Bilirubin, UA: NEGATIVE
Glucose, UA: 500 mg/dL — AB
Ketones, POC UA: NEGATIVE mg/dL
Leukocytes, UA: NEGATIVE
Nitrite, UA: NEGATIVE
Protein Ur, POC: >=300 mg/dL — AB
Spec Grav, UA: <=1.005 — AB (ref 1.010–1.025)
Urobilinogen, UA: 0.2 E.U./dL
pH, UA: 5.5 (ref 5.0–8.0)

## 2017-12-30 LAB — URINE CULTURE

## 2017-12-30 LAB — GLUCOSE, POCT (MANUAL RESULT ENTRY)
POC Glucose: 412 mg/dl — AB (ref 70–99)
POC Glucose: 289 mg/dl — AB (ref 70–99)

## 2017-12-30 MED ORDER — INSULIN LISPRO 100 UNIT/ML ~~LOC~~ SOLN
20.0000 [IU] | Freq: Once | SUBCUTANEOUS | Status: AC
  Administered 2017-12-30: 20 [IU] via SUBCUTANEOUS

## 2017-12-30 MED ORDER — METFORMIN HCL 500 MG PO TABS: 500.0000 mg | ORAL_TABLET | Freq: Two times a day (BID) | ORAL | 2 refills | Status: DC

## 2017-12-30 MED FILL — metFORMIN HCL 500 MG TABS: 500 | 30 days supply | Qty: 60 | Fill #0

## 2017-12-30 NOTE — Patient Instructions (Signed)
It was a pleasure meeting you. I want you to start out the metformin (diabetes medication) by taking it one time a day with breakfast. After one week, you will take the medication with breakfast and dinner.  Remember to check your blood sugar each morning prior to eating or drinking anything.   Diabetes Mellitus and Nutrition When you have diabetes (diabetes mellitus), it is very important to have healthy eating habits because your blood sugar (glucose) levels are greatly affected by what you eat and drink. Eating healthy foods in the appropriate amounts, at about the same times every day, can help you:  Control your blood glucose.  Lower your risk of heart disease.  Improve your blood pressure.  Reach or maintain a healthy weight.  Every person with diabetes is different, and each person has different needs for a meal plan. Your health care provider may recommend that you work with a diet and nutrition specialist (dietitian) to make a meal plan that is best for you. Your meal plan may vary depending on factors such as:  The calories you need.  The medicines you take.  Your weight.  Your blood glucose, blood pressure, and cholesterol levels.  Your activity level.  Other health conditions you have, such as heart or kidney disease.  How do carbohydrates affect me? Carbohydrates affect your blood glucose level more than any other type of food. Eating carbohydrates naturally increases the amount of glucose in your blood. Carbohydrate counting is a method for keeping track of how many carbohydrates you eat. Counting carbohydrates is important to keep your blood glucose at a healthy level, especially if you use insulin or take certain oral diabetes medicines. It is important to know how many carbohydrates you can safely have in each meal. This is different for every person. Your dietitian can help you calculate how many carbohydrates you should have at each meal and for snack. Foods that  contain carbohydrates include:  Bread, cereal, rice, pasta, and crackers.  Potatoes and corn.  Peas, beans, and lentils.  Milk and yogurt.  Fruit and juice.  Desserts, such as cakes, cookies, ice cream, and candy.  How does alcohol affect me? Alcohol can cause a sudden decrease in blood glucose (hypoglycemia), especially if you use insulin or take certain oral diabetes medicines. Hypoglycemia can be a life-threatening condition. Symptoms of hypoglycemia (sleepiness, dizziness, and confusion) are similar to symptoms of having too much alcohol. If your health care provider says that alcohol is safe for you, follow these guidelines:  Limit alcohol intake to no more than 1 drink per day for nonpregnant women and 2 drinks per day for men. One drink equals 12 oz of beer, 5 oz of wine, or 1 oz of hard liquor.  Do not drink on an empty stomach.  Keep yourself hydrated with water, diet soda, or unsweetened iced tea.  Keep in mind that regular soda, juice, and other mixers may contain a lot of sugar and must be counted as carbohydrates.  What are tips for following this plan? Reading food labels  Start by checking the serving size on the label. The amount of calories, carbohydrates, fats, and other nutrients listed on the label are based on one serving of the food. Many foods contain more than one serving per package.  Check the total grams (g) of carbohydrates in one serving. You can calculate the number of servings of carbohydrates in one serving by dividing the total carbohydrates by 15. For example, if a food has  30 g of total carbohydrates, it would be equal to 2 servings of carbohydrates.  Check the number of grams (g) of saturated and trans fats in one serving. Choose foods that have low or no amount of these fats.  Check the number of milligrams (mg) of sodium in one serving. Most people should limit total sodium intake to less than 2,300 mg per day.  Always check the nutrition  information of foods labeled as "low-fat" or "nonfat". These foods may be higher in added sugar or refined carbohydrates and should be avoided.  Talk to your dietitian to identify your daily goals for nutrients listed on the label. Shopping  Avoid buying canned, premade, or processed foods. These foods tend to be high in fat, sodium, and added sugar.  Shop around the outside edge of the grocery store. This includes fresh fruits and vegetables, bulk grains, fresh meats, and fresh dairy. Cooking  Use low-heat cooking methods, such as baking, instead of high-heat cooking methods like deep frying.  Cook using healthy oils, such as olive, canola, or sunflower oil.  Avoid cooking with butter, cream, or high-fat meats. Meal planning  Eat meals and snacks regularly, preferably at the same times every day. Avoid going long periods of time without eating.  Eat foods high in fiber, such as fresh fruits, vegetables, beans, and whole grains. Talk to your dietitian about how many servings of carbohydrates you can eat at each meal.  Eat 4-6 ounces of lean protein each day, such as lean meat, chicken, fish, eggs, or tofu. 1 ounce is equal to 1 ounce of meat, chicken, or fish, 1 egg, or 1/4 cup of tofu.  Eat some foods each day that contain healthy fats, such as avocado, nuts, seeds, and fish. Lifestyle   Check your blood glucose regularly.  Exercise at least 30 minutes 5 or more days each week, or as told by your health care provider.  Take medicines as told by your health care provider.  Do not use any products that contain nicotine or tobacco, such as cigarettes and e-cigarettes. If you need help quitting, ask your health care provider.  Work with a Social worker or diabetes educator to identify strategies to manage stress and any emotional and social challenges. What are some questions to ask my health care provider?  Do I need to meet with a diabetes educator?  Do I need to meet with a  dietitian?  What number can I call if I have questions?  When are the best times to check my blood glucose? Where to find more information:  American Diabetes Association: diabetes.org/food-and-fitness/food  Academy of Nutrition and Dietetics: PokerClues.dk  [employer] of Diabetes and Digestive and Kidney Diseases (NIH): ContactWire.be Summary  A healthy meal plan will help you control your blood glucose and maintain a healthy lifestyle.  Working with a diet and nutrition specialist (dietitian) can help you make a meal plan that is best for you.  Keep in mind that carbohydrates and alcohol have immediate effects on your blood glucose levels. It is important to count carbohydrates and to use alcohol carefully. This information is not intended to replace advice given to you by your health care provider. Make sure you discuss any questions you have with your health care provider. Document Released: 11/27/2004 Document Revised: 04/06/2016 Document Reviewed: 04/06/2016 Elsevier Interactive Patient Education  [employer].

## 2017-12-30 NOTE — Progress Notes (Signed)
Patient [facility]  New Patient Encounter Provider: Lanae Boast, [place]  FYB:[id number]  DOB - [birth year]  SUBJECTIVE:   Donald Wheeler, is a 47 y.o. male who presents to establish care with this clinic.   Current problems/concerns:  Patient seen in the ED on 12/23/17 due to "feeling funny" Patient with A1c of 13.8 and new onset DM2 diagnosis. Patient started on 25 units of levemir. He would like to stop the insulin and find alternative therapy. Patient reports compliance with medications. He does not have insurance and was given 4 pens at discharge. Has not been testing his FBS consistently.   No Known Allergies Past Medical History:  Diagnosis Date  . Bell's palsy 09/07/2013  . Diabetes mellitus without complication ([place]) 52/7782   NEW ONSET   . HIV (human immunodeficiency virus infection) ([place])    dx'd 2000s  . Hypertension   . Stroke ([place]) 04/09/2017   vs TIA w/acute onset of right-sided weakness as well as trouble speaking/notes 04/09/2017   Current Outpatient Medications on File Prior to Visit  Medication Sig Dispense Refill  . amLODipine (NORVASC) 10 MG tablet Take 1 tablet (10 mg total) by mouth daily. 30 tablet 5  . aspirin EC 81 MG tablet Take 1 tablet (81 mg total) by mouth daily. 100 tablet 3  . bictegravir-emtricitabine-tenofovir AF (BIKTARVY) 50-200-25 MG TABS tablet Take 1 tablet by mouth daily. 30 tablet 11  . blood glucose meter kit and supplies KIT Dispense based on patient and insurance preference. Use up to four times daily as directed. (FOR ICD-9 250.00, 250.01). 1 each 0  . Insulin Detemir (LEVEMIR) 100 UNIT/ML Pen Inject 25 Units into the skin daily. 15 mL 0  . atorvastatin (LIPITOR) 80 MG tablet Take 1 tablet (80 mg total) by mouth daily at 6 PM. 30 tablet 0   No current facility-administered medications on file prior to visit.    History reviewed. No pertinent family history. Social History    Socioeconomic History  . Marital status: Divorced    Spouse name: Not on file  . Number of children: Not on file  . Years of education: Not on file  . Highest education level: Not on file  Occupational History  . Not on file  Social Needs  . Financial resource strain: Not on file  . Food insecurity:    Worry: Not on file    Inability: Not on file  . Transportation needs:    Medical: Not on file    Non-medical: Not on file  Tobacco Use  . Smoking status: Former Smoker    Packs/day: 0.20    Years: 15.00    Pack years: 3.00    Types: Cigars    Last attempt to quit: 12/12/2017    Years since quitting: 0.0  . Smokeless tobacco: Never Used  . Tobacco comment: black and mildsl daily  Substance and Sexual Activity  . Alcohol use: Yes    Comment: 04/09/2017 "might have 2 beers/month; if that"  . Drug use: Yes    Types: Marijuana    Comment: 04/09/2017 "maybe 10 times/year"  . Sexual activity: Not Currently    Partners: Female    Birth control/protection: None, Condom    Comment: patient given condoms  Lifestyle  . Physical activity:    Days per week: Not on file    Minutes per session: Not on file  . Stress: Not on file  Relationships  . Social connections:  Talks on phone: Not on file    Gets together: Not on file    Attends religious service: Not on file    Active member of club or organization: Not on file    Attends meetings of clubs or organizations: Not on file    Relationship status: Not on file  . Intimate partner violence:    Fear of current or ex partner: Not on file    Emotionally abused: Not on file    Physically abused: Not on file    Forced sexual activity: Not on file  Other Topics Concern  . Not on file  Social History Narrative  . Not on file    Review of Systems  Constitutional: Negative.   HENT: Negative.   Eyes: Negative.   Respiratory: Negative.   Cardiovascular: Negative.   Gastrointestinal: Negative.   Genitourinary: Negative.    Musculoskeletal: Negative.   Skin: Negative.   Neurological: Negative.   Psychiatric/Behavioral: Negative.      OBJECTIVE:    BP 134/86 (BP Location: Left Arm, Patient Position: Sitting, Cuff Size: Large)   Pulse 84   Temp 98.6 F (37 C) (Oral)   Ht _0  (1.702 m)   Wt 192 lb (87.1 kg)   SpO2 97%   BMI 30.07 kg/m   Physical Exam  Constitutional: He is oriented to person, place, and time and well-developed, well-nourished, and in no distress. No distress.  HENT:  Head: Normocephalic and atraumatic.  Eyes: Pupils are equal, round, and reactive to light. Conjunctivae and EOM are normal.  Neck: Normal range of motion. Neck supple.  Cardiovascular: Normal rate, regular rhythm and intact distal pulses. Exam reveals no gallop and no friction rub.  No murmur heard. Pulmonary/Chest: Effort normal and breath sounds normal. No respiratory distress. He has no wheezes.  Abdominal: Soft. Bowel sounds are normal. There is no tenderness.  Musculoskeletal: Normal range of motion. He exhibits no edema or tenderness.  Lymphadenopathy:    He has no cervical adenopathy.  Neurological: He is alert and oriented to person, place, and time. Gait normal.  Skin: Skin is warm and dry.  Psychiatric: Mood, memory, affect and judgment normal.  Nursing note and vitals reviewed.    ASSESSMENT/PLAN: 1. Diabetes mellitus, new onset ([place]) Continue with levemir 25 units QHS. Will start metformin and return for follow up in 2 weeks.  - POCT urinalysis dipstick - POCT glucose (manual entry) - metFORMIN (GLUCOPHAGE) 500 MG tablet; Take 1 tablet (500 mg total) by mouth 2 (two) times daily with a meal. Start with 1 pill by mouth every morning x 1 week. Then 1 pill BID  Dispense: 60 tablet; Refill: 2 - Comprehensive metabolic panel  2. Elevated glucose - insulin lispro (HUMALOG) injection 20 Units - POCT glucose (manual entry)  3. Abnormal urine - Urine Culture     No follow-ups on file.  The  patient was given clear instructions to go to ER or return to medical center if symptoms don't improve, worsen or new problems develop. The patient verbalized understanding. The patient was told to call to get lab results if they haven't heard anything in the next week.     This note has been created with Surveyor, quantity. Any transcriptional errors are unintentional.   Ms. Andr L. Nathaneil Canary, FNP-BC [facility] [address] [place], [place] [address] [phone]

## 2017-12-31 ENCOUNTER — Ambulatory Visit: Payer: Self-pay | Admitting: Infectious Diseases

## 2017-12-31 LAB — COMPREHENSIVE METABOLIC PANEL
ALT: 21 IU/L (ref 0–44)
AST: 19 IU/L (ref 0–40)
Albumin/Globulin Ratio: 1.2 (ref 1.2–2.2)
Albumin: 3.9 g/dL (ref 3.5–5.5)
Alkaline Phosphatase: 98 IU/L (ref 39–117)
BUN/Creatinine Ratio: 10 (ref 9–20)
BUN: 13 mg/dL (ref 6–24)
Bilirubin Total: 0.3 mg/dL (ref 0.0–1.2)
CO2: 25 mmol/L (ref 20–29)
Calcium: 9.3 mg/dL (ref 8.7–10.2)
Chloride: 101 mmol/L (ref 96–106)
Creatinine, Ser: 1.26 mg/dL (ref 0.76–1.27)
GFR calc Af Amer: 77 mL/min/{1.73_m2} (ref >59)
GFR calc non Af Amer: 67 mL/min/{1.73_m2} (ref >59)
Globulin, Total: 3.3 g/dL (ref 1.5–4.5)
Glucose: 246 mg/dL — ABNORMAL HIGH (ref 65–99)
Potassium: 4.2 mmol/L (ref 3.5–5.2)
Sodium: 140 mmol/L (ref 134–144)
Total Protein: 7.2 g/dL (ref 6.0–8.5)

## 2018-01-01 LAB — URINE CULTURE

## 2018-01-06 ENCOUNTER — Other Ambulatory Visit: Payer: Self-pay | Admitting: Infectious Diseases

## 2018-01-06 DIAGNOSIS — B20 Human immunodeficiency virus [HIV] disease: Secondary | ICD-10-CM

## 2018-01-06 DIAGNOSIS — I10 Essential (primary) hypertension: Secondary | ICD-10-CM

## 2018-02-03 ENCOUNTER — Ambulatory Visit (INDEPENDENT_AMBULATORY_CARE_PROVIDER_SITE_OTHER): Payer: Self-pay | Admitting: Family Medicine

## 2018-02-03 VITALS — BP 143/95 | HR 67 | Temp 98.2°F | Resp 16 | Ht 67.0 in | Wt 196.0 lb

## 2018-02-03 DIAGNOSIS — E119 Type 2 diabetes mellitus without complications: Secondary | ICD-10-CM

## 2018-02-03 DIAGNOSIS — E1165 Type 2 diabetes mellitus with hyperglycemia: Secondary | ICD-10-CM

## 2018-02-03 LAB — POCT GLYCOSYLATED HEMOGLOBIN (HGB A1C): Hemoglobin A1C: 11.7 % — AB (ref 4.0–5.6)

## 2018-02-03 LAB — GLUCOSE, POCT (MANUAL RESULT ENTRY): POC Glucose: 156 mg/dL — AB (ref 70–99)

## 2018-02-03 NOTE — Progress Notes (Signed)
[facility]  Diabetes Mellitus Type II, Follow-up Provider: Mike Gip, FNP   Patient here for follow-up of Type 2 diabetes mellitus.  Known diabetic complications: Blurred vision Cardiovascular risk factors: diabetes mellitus, family history of premature cardiovascular disease, male gender and obesity (BMI >= 30 kg/m2) Current diabetic medications include : Metformin 500 mg BID and Levemir 25 units each day.   Eye exam current (within one year): No referral placed today Weight trend: stable Prior visit with dietician: no. Patient is uninsured and does not want one at the present time.  Current diet: making dietary changes.  Current exercise: walking  Current monitoring regimen: home blood tests - daily Home blood sugar records: fasting range: under 150 Any episodes of hypoglycemia? no  Low fat/carbohydrate diet?  Yes Nicotine Abuse?  No Medication Compliance?  No Exercise?  Yes Alcohol Abuse?  No  Home BG Monitoring:  Checking 1 times a day. Average:  150  High:356   Low:  100  Foot Exam:  1. Do you have any pain in your calf muscles when walking?  No 2. Do you have any pain in your foot or feet?  No  If yes, n/a 3. Have you had any problems with your feet since the last visit?  No.   4. Do you have any sores on your feet?  No.  If yes, which foot?  Neither 5. Have you noticed any blood or discharge on your socks or hose?  No 6. Does patient wear shoes that are closed toe, low heel, adequate toe room (no pointed toe), and breathable material like leather, canvas or suede?  Yes  PAST DIABETIC HISTORY:  1. History of amputations:  No  2. Previous foot ulcer:  No  Review of Systems  Constitutional: Negative.   HENT: Negative.   Eyes: Negative.   Respiratory: Negative.   Cardiovascular: Negative.   Gastrointestinal: Negative.   Genitourinary: Negative.   Musculoskeletal: Negative.   Skin: Negative.   Neurological:  Negative.   Psychiatric/Behavioral: Negative.      Lab Results  Component Value Date   HGBA1C 11.7 (A) 02/03/2018    No results found for: Donald Wheeler  Lab Results  Component Value Date   CHOL 226 (H) 12/23/2017   HDL 35 (L) 12/23/2017   LDLCALC 141 (H) 12/23/2017   LDLDIRECT 124 (H) 08/16/2017   TRIG 252 (H) 12/23/2017   CHOLHDL 6.5 12/23/2017     Physical Exam  Constitutional: He is oriented to person, place, and time and well-developed, well-nourished, and in no distress. No distress.  HENT:  Head: Normocephalic and atraumatic.  Eyes: Pupils are equal, round, and reactive to light. Conjunctivae and EOM are normal.  Neck: Normal range of motion. Neck supple.  Cardiovascular: Normal rate, regular rhythm and intact distal pulses. Exam reveals no gallop and no friction rub.  No murmur heard. Pulmonary/Chest: Effort normal and breath sounds normal. No respiratory distress. He has no wheezes.  Abdominal: Soft. Bowel sounds are normal. There is no tenderness.  Musculoskeletal: Normal range of motion. He exhibits no edema or tenderness.  Lymphadenopathy:    He has no cervical adenopathy.  Neurological: He is alert and oriented to person, place, and time. Gait normal.  Skin: Skin is warm and dry.  Psychiatric: Mood, memory, affect and judgment normal.  Nursing note and vitals reviewed.    1. Diabetes mellitus, new onset ([facility]) Patient reports metformin QD instead of BID. Instructed patient to take  BID. Continue with levemir 25 units each day. Encouraged to keep exercising and healthy eating.  - HgB A1c - Glucose (CBG)   Diabetic Foot Exam - Simple   Simple Foot Form Diabetic Foot exam was performed with the following findings:  Yes 02/03/2018 11:37 AM  Visual Inspection No deformities, no ulcerations, no other skin breakdown bilaterally:  Yes Sensation Testing Intact to touch and monofilament testing bilaterally:  Yes Pulse Check Posterior Tibialis and Dorsalis  pulse intact bilaterally:  Yes Comments    Recommendations: 1.  Patient is counseled on appropriate foot care. 2.  BP goal < 130/80. 3.  LDL goal of < 100, HDL > 40 and TG < 150. 4.  Eye Exam yearly and Dental Exam every 6 months. 5.  Dietary recommendations:  Continue with low carb low fat diet.  6.  Physical Activity recommendations:  150 minutes of cardio activity weekly 7.  Medication recommendations at this time are as follows:  Metformin BID instead of QD.  8.  Return to clinic in 2 months

## 2018-02-03 NOTE — Patient Instructions (Addendum)
Remember to take the metformin twice a day: breakfast and dinner time. You are doing great and the A1C is coming down. The goal is to get under 7. Continue walking and watching what you eat.    Diabetes Mellitus and Nutrition When you have diabetes (diabetes mellitus), it is very important to have healthy eating habits because your blood sugar (glucose) levels are greatly affected by what you eat and drink. Eating healthy foods in the appropriate amounts, at about the same times every day, can help you:  Control your blood glucose.  Lower your risk of heart disease.  Improve your blood pressure.  Reach or maintain a healthy weight.  Every person with diabetes is different, and each person has different needs for a meal plan. Your health care provider may recommend that you work with a diet and nutrition specialist (dietitian) to make a meal plan that is best for you. Your meal plan may vary depending on factors such as:  The calories you need.  The medicines you take.  Your weight.  Your blood glucose, blood pressure, and cholesterol levels.  Your activity level.  Other health conditions you have, such as heart or kidney disease.  How do carbohydrates affect me? Carbohydrates affect your blood glucose level more than any other type of food. Eating carbohydrates naturally increases the amount of glucose in your blood. Carbohydrate counting is a method for keeping track of how many carbohydrates you eat. Counting carbohydrates is important to keep your blood glucose at a healthy level, especially if you use insulin or take certain oral diabetes medicines. It is important to know how many carbohydrates you can safely have in each meal. This is different for every person. Your dietitian can help you calculate how many carbohydrates you should have at each meal and for snack. Foods that contain carbohydrates include:  Bread, cereal, rice, pasta, and crackers.  Potatoes and  corn.  Peas, beans, and lentils.  Milk and yogurt.  Fruit and juice.  Desserts, such as cakes, cookies, ice cream, and candy.  How does alcohol affect me? Alcohol can cause a sudden decrease in blood glucose (hypoglycemia), especially if you use insulin or take certain oral diabetes medicines. Hypoglycemia can be a life-threatening condition. Symptoms of hypoglycemia (sleepiness, dizziness, and confusion) are similar to symptoms of having too much alcohol. If your health care provider says that alcohol is safe for you, follow these guidelines:  Limit alcohol intake to no more than 1 drink per day for nonpregnant women and 2 drinks per day for men. One drink equals 12 oz of beer, 5 oz of wine, or 1 oz of hard liquor.  Do not drink on an empty stomach.  Keep yourself hydrated with water, diet soda, or unsweetened iced tea.  Keep in mind that regular soda, juice, and other mixers may contain a lot of sugar and must be counted as carbohydrates.  What are tips for following this plan? Reading food labels  Start by checking the serving size on the label. The amount of calories, carbohydrates, fats, and other nutrients listed on the label are based on one serving of the food. Many foods contain more than one serving per package.  Check the total grams (g) of carbohydrates in one serving. You can calculate the number of servings of carbohydrates in one serving by dividing the total carbohydrates by 15. For example, if a food has 30 g of total carbohydrates, it would be equal to 2 servings of carbohydrates.  Check the number of grams (g) of saturated and trans fats in one serving. Choose foods that have low or no amount of these fats.  Check the number of milligrams (mg) of sodium in one serving. Most people should limit total sodium intake to less than 2,300 mg per day.  Always check the nutrition information of foods labeled as "low-fat" or "nonfat". These foods may be higher in added  sugar or refined carbohydrates and should be avoided.  Talk to your dietitian to identify your daily goals for nutrients listed on the label. Shopping  Avoid buying canned, premade, or processed foods. These foods tend to be high in fat, sodium, and added sugar.  Shop around the outside edge of the grocery store. This includes fresh fruits and vegetables, bulk grains, fresh meats, and fresh dairy. Cooking  Use low-heat cooking methods, such as baking, instead of high-heat cooking methods like deep frying.  Cook using healthy oils, such as olive, canola, or sunflower oil.  Avoid cooking with butter, cream, or high-fat meats. Meal planning  Eat meals and snacks regularly, preferably at the same times every day. Avoid going long periods of time without eating.  Eat foods high in fiber, such as fresh fruits, vegetables, beans, and whole grains. Talk to your dietitian about how many servings of carbohydrates you can eat at each meal.  Eat 4-6 ounces of lean protein each day, such as lean meat, chicken, fish, eggs, or tofu. 1 ounce is equal to 1 ounce of meat, chicken, or fish, 1 egg, or 1/4 cup of tofu.  Eat some foods each day that contain healthy fats, such as avocado, nuts, seeds, and fish. Lifestyle   Check your blood glucose regularly.  Exercise at least 30 minutes 5 or more days each week, or as told by your health care provider.  Take medicines as told by your health care provider.  Do not use any products that contain nicotine or tobacco, such as cigarettes and e-cigarettes. If you need help quitting, ask your health care provider.  Work with a Veterinary surgeoncounselor or diabetes educator to identify strategies to manage stress and any emotional and social challenges. What are some questions to ask my health care provider?  Do I need to meet with a diabetes educator?  Do I need to meet with a dietitian?  What number can I call if I have questions?  When are the best times to check my  blood glucose? Where to find more information:  American Diabetes Association: diabetes.org/food-and-fitness/food  Academy of Nutrition and Dietetics: https://www.vargas.com/www.eatright.org/resources/health/diseases-and-conditions/diabetes  [employer] Institute of Diabetes and Digestive and Kidney Diseases (NIH): FindJewelers.czwww.niddk.nih.gov/health-information/diabetes/overview/diet-eating-physical-activity Summary  A healthy meal plan will help you control your blood glucose and maintain a healthy lifestyle.  Working with a diet and nutrition specialist (dietitian) can help you make a meal plan that is best for you.  Keep in mind that carbohydrates and alcohol have immediate effects on your blood glucose levels. It is important to count carbohydrates and to use alcohol carefully. This information is not intended to replace advice given to you by your health care provider. Make sure you discuss any questions you have with your health care provider. Document Released: 11/27/2004 Document Revised: 04/06/2016 Document Reviewed: 04/06/2016 Elsevier Interactive Patient Education  [employer]2018 Elsevier Inc.

## 2018-02-21 MED FILL — !LANTUS SOLOSTAR 100UNITS/M: 100 | 23 days supply | Qty: 6 | Fill #0

## 2018-03-08 ENCOUNTER — Other Ambulatory Visit: Payer: Self-pay | Admitting: Infectious Diseases

## 2018-03-14 MED FILL — metFORMIN HCL 500 MG TABS: 500 | 30 days supply | Qty: 60 | Fill #1

## 2018-03-17 MED FILL — metFORMIN HCL 500 MG TABS: 500 | 30 days supply | Qty: 60 | Fill #1 | Status: TO

## 2018-03-21 MED FILL — !LANTUS SOLOSTAR 100UNITS/M: 100 | 23 days supply | Qty: 6 | Fill #1

## 2018-04-06 ENCOUNTER — Ambulatory Visit: Payer: Self-pay | Admitting: Family Medicine

## 2018-04-11 MED FILL — !LANTUS SOLOSTAR 100UNITS/M: 100 | 23 days supply | Qty: 6 | Fill #2

## 2018-04-11 MED FILL — ?METFORMIN HCL 500MG TABLET: 500 | 30 days supply | Qty: 60 | Fill #0

## 2018-04-19 ENCOUNTER — Other Ambulatory Visit: Payer: Self-pay | Admitting: Internal Medicine

## 2018-04-19 ENCOUNTER — Telehealth: Payer: Self-pay

## 2018-04-19 NOTE — Telephone Encounter (Signed)
Called patient to schedule appointment for lab work / office visit. Patient was last seen on 08/2017. Has no showed follow-up appointments.  Was able to talk to patient and schedule appointment for lab work/ office visit. Patient understands he must call if he needs to reschedule.  Donald Wheeler, [place]

## 2018-04-26 ENCOUNTER — Other Ambulatory Visit: Payer: Self-pay

## 2018-04-26 DIAGNOSIS — B20 Human immunodeficiency virus [HIV] disease: Secondary | ICD-10-CM

## 2018-04-26 DIAGNOSIS — Z79899 Other long term (current) drug therapy: Secondary | ICD-10-CM

## 2018-04-26 DIAGNOSIS — Z113 Encounter for screening for infections with a predominantly sexual mode of transmission: Secondary | ICD-10-CM

## 2018-05-04 MED FILL — $LANTUS SOLOSTAR 100 UNITS/: 100 | 92 days supply | Qty: 24 | Fill #3

## 2018-05-09 ENCOUNTER — Ambulatory Visit: Payer: Self-pay | Admitting: Family

## 2018-05-30 ENCOUNTER — Other Ambulatory Visit: Payer: Self-pay | Admitting: Internal Medicine

## 2018-06-01 ENCOUNTER — Other Ambulatory Visit: Payer: Self-pay | Admitting: Infectious Diseases

## 2018-06-07 ENCOUNTER — Other Ambulatory Visit: Payer: Self-pay | Admitting: Internal Medicine

## 2018-07-15 DIAGNOSIS — B029 Zoster without complications: Secondary | ICD-10-CM | POA: Insufficient documentation

## 2018-07-20 ENCOUNTER — Encounter: Payer: Self-pay | Admitting: Infectious Diseases

## 2018-07-20 ENCOUNTER — Other Ambulatory Visit: Payer: Self-pay

## 2018-07-20 ENCOUNTER — Ambulatory Visit (INDEPENDENT_AMBULATORY_CARE_PROVIDER_SITE_OTHER): Payer: Self-pay | Admitting: Infectious Diseases

## 2018-07-20 ENCOUNTER — Encounter: Payer: Self-pay | Admitting: Internal Medicine

## 2018-07-20 ENCOUNTER — Telehealth: Payer: Self-pay | Admitting: *Deleted

## 2018-07-20 VITALS — BP 154/93 | HR 76 | Temp 98.8°F | Ht 66.0 in | Wt 197.0 lb

## 2018-07-20 DIAGNOSIS — E119 Type 2 diabetes mellitus without complications: Secondary | ICD-10-CM

## 2018-07-20 DIAGNOSIS — Z79899 Other long term (current) drug therapy: Secondary | ICD-10-CM

## 2018-07-20 DIAGNOSIS — Z113 Encounter for screening for infections with a predominantly sexual mode of transmission: Secondary | ICD-10-CM

## 2018-07-20 DIAGNOSIS — B20 Human immunodeficiency virus [HIV] disease: Secondary | ICD-10-CM

## 2018-07-20 DIAGNOSIS — B029 Zoster without complications: Secondary | ICD-10-CM

## 2018-07-20 DIAGNOSIS — Z91199 Patient's noncompliance with other medical treatment and regimen due to unspecified reason: Secondary | ICD-10-CM

## 2018-07-20 DIAGNOSIS — Z9119 Patient's noncompliance with other medical treatment and regimen: Secondary | ICD-10-CM

## 2018-07-20 MED ORDER — ASPIRIN EC 81 MG PO TBEC: 81.0000 mg | DELAYED_RELEASE_TABLET | Freq: Every day | ORAL | 3 refills | Status: DC

## 2018-07-20 MED ORDER — VALACYCLOVIR HCL 1 G PO TABS: 1000.0000 mg | ORAL_TABLET | Freq: Three times a day (TID) | ORAL | 0 refills | Status: AC

## 2018-07-20 MED ORDER — METFORMIN HCL 500 MG PO TABS: 500.0000 mg | ORAL_TABLET | Freq: Two times a day (BID) | ORAL | 0 refills | Status: DC

## 2018-07-20 MED ORDER — ATORVASTATIN CALCIUM 80 MG PO TABS: 80.0000 mg | ORAL_TABLET | Freq: Every day | ORAL | 0 refills | Status: DC

## 2018-07-20 MED ORDER — INSULIN DETEMIR 100 UNIT/ML FLEXPEN: 25.0000 [IU] | PEN_INJECTOR | Freq: Every day | SUBCUTANEOUS | 0 refills | Status: DC

## 2018-07-20 MED ORDER — AMLODIPINE BESYLATE 10 MG PO TABS: ORAL_TABLET | ORAL | 0 refills | Status: DC

## 2018-07-20 MED FILL — $LANTUS SOLOSTAR 100 UNITS/: 100 | 92 days supply | Qty: 24 | Fill #4

## 2018-07-20 NOTE — Progress Notes (Signed)
Subjective:    Patient ID: Donald Wheeler, male    DOB: 12/15/1970, 48 y.o.   MRN: [id number]  HPI The patient is a 48 year old African-American male with HIV presenting today for an acute illness and rash to his chest wall.  He normally follows with Dr. Ilsa IhaSnyder in our clinic but he also has seen our nurse practitioner Rexene AlbertsStephanie Dixon most recently on August 16, 2017.  His most HIV labs were drawn on December 22, 2017 showed a CD4 count of 300 and HIV viral load of 36 copies on an antiretroviral regimen of biktarvy.  Despite his noncompliance with office visits and labs, the patient states that he is taking his Biktarvy faithfully and missed no doses of his medication.  He recently was seen by an urgent care for his rash and given steroids on the presumption that he had contact dermatitis.  Shortly after taking steroids, his rash intensified and became extremely painful to his chest wall.  At today's visit he shows classic signs and symptoms of shingles.  He denies any recent sick contacts, specifically young children he may have had varicella or would have recently been vaccinated for chickenpox.   Past Medical History:  Diagnosis Date  . Bell's palsy 09/07/2013  . Diabetes mellitus without complication (HCC) 12/2017   NEW ONSET   . HIV (human immunodeficiency virus infection) (HCC)    dx'd 2000s  . Hypertension   . Stroke (HCC) 04/09/2017   vs TIA w/acute onset of right-sided weakness as well as trouble speaking/notes 04/09/2017   No family history on file.   Social History   Tobacco Use  . Smoking status: Current Every Day Smoker    Packs/day: 0.20    Years: 15.00    Pack years: 3.00    Types: Cigars    Last attempt to quit: 12/12/2017    Years since quitting: 0.6  . Smokeless tobacco: Never Used  . Tobacco comment: black and mildsl daily  Substance Use Topics  . Alcohol use: Yes    Comment: 04/09/2017 "might have 2 beers/month; if that"  . Drug use: Yes    Types: Marijuana    Comment:  04/09/2017 "maybe 10 times/year"    Review of Systems  Constitutional: Negative for chills, fatigue and fever.  HENT: Negative for congestion, hearing loss, rhinorrhea and sinus pressure.   Eyes: Negative for photophobia, pain, redness and visual disturbance.  Respiratory: Negative for apnea, cough, shortness of breath and wheezing.   Cardiovascular: Negative for chest pain and palpitations.  Gastrointestinal: Negative for abdominal pain, constipation, diarrhea, nausea and vomiting.  Endocrine: Negative for cold intolerance, heat intolerance, polydipsia and polyuria.  Genitourinary: Negative for decreased urine volume, dysuria, frequency, hematuria and testicular pain.  Musculoskeletal: Negative for back pain, myalgias and neck pain.  Skin: Negative for pallor and rash.  Allergic/Immunologic: Negative for immunocompromised state.  Neurological: Negative for dizziness, seizures, syncope, speech difficulty and light-headedness.  Hematological: Does not bruise/bleed easily.  Psychiatric/Behavioral: Negative for agitation and hallucinations. The patient is not nervous/anxious.    Vitals:   07/20/18 1359  BP: (!) 154/93  Pulse: 76  Temp: 98.8 F (37.1 C)      Objective:   Physical Exam Gen: pleasant, moderate distress secondary to RT chest wall pain, A&Ox 3 Head: NCAT, no temporal wasting evident EENT: PERRL, EOMI, MMM, adequate dentition Neck: supple, no JVD CV: NRRR, no murmurs evident Pulm: CTA bilaterally, no wheeze or retractions Abd: soft, NTND, +BS Extrems: 1noLE edema, 2+ pulses Skin: vesicular  rash to RT lower chest wall c/w shingles, adequate skin turgor Neuro: CN II-XII grossly intact, no focal neurologic deficits appreciated, gait was not assessed, A&Ox 3  Lab Results  Component Value Date   HIV1RNAQUANT 36 (H) 12/22/2017   HIV1RNAQUANT 99 (H) 05/17/2017   HIV1RNAQUANT 66 (H) 02/23/2017   Lab Results  Component Value Date   CD4TCELL 13 (L) 12/22/2017   CD4TABS  300 (L) 12/22/2017       Assessment & Plan:  The patient is a 48 y/o AAM with HIV, h/o BG CVA, and non-compliance presenting with RT chest wall rash/shingles x 2 days.  1. Shingles - The most likely reason for such intensification of his rash and shingles would have been his recent receipt of his steroids.  I have instructed the patient to stop the steroids immediately and prescribed Valtrex 1 g p.o. 3 times daily for him to take for the next 10 days.  If his rash fails to improve, he may require prolonging of his Valtrex given his recent steroid course.  Otherwise, he will follow-up with Dr. Drue Second in 2 weeks time for reassessment.  Once he is approximately 6 months removed from his outbreak, did would be of some value to consider administering Shingrix vaccine for the patient to prevent further flares lower his risk for postherpetic neuralgia.  I have instructed him to take either low dose ibuprofen or tylenol for pain but have emphasized that his Valtrex would be the primary treatment for his neuropathic pain and at the present time.  2. HIV - CD4 reconstitution steady with most recent CD4 count of 300 but persistent HIV viremia of 36 copies per labs obtained in 10/19. Despite persistent low level viremia, the patient denies missing any doses of his biktarvy. He continues to miss lab draws and appointments with Dr. Drue Second, citing transportation issues (although he has not provided staff with updated phone numbers and address after he moved with his mother last year). Will repeat CD4 count and viral load in today and have pt follow up with Dr. Drue Second in 2 weeks. ARV regimen of biktarvy will be refilled then.

## 2018-07-20 NOTE — Addendum Note (Signed)
Addended by: Loney Hering on: 07/20/2018 02:25 PM   Modules accepted: Orders

## 2018-07-20 NOTE — Patient Instructions (Signed)
Have HIV labs drawn today. Begin valtrex for shingles.

## 2018-07-20 NOTE — Telephone Encounter (Signed)
Patient expressed recent symptoms of depression, would like [facility]'s counselor to reach out to him via phone for warm hand off later this week or next and potential counseling on regular basis. Andree Coss, RN

## 2018-07-21 ENCOUNTER — Telehealth: Payer: Self-pay | Admitting: *Deleted

## 2018-07-21 LAB — URINE CYTOLOGY ANCILLARY ONLY
Chlamydia: NEGATIVE
Neisseria Gonorrhea: NEGATIVE

## 2018-07-21 LAB — T-HELPER CELLS (CD4) COUNT (NOT AT ARMC)
CD4 % Helper T Cell: 18 % — ABNORMAL LOW (ref 33–65)
CD4 T Cell Abs: 345 /uL — ABNORMAL LOW (ref 400–1790)

## 2018-07-21 NOTE — Telephone Encounter (Signed)
Patient called in pain. He states he has been taking 600 mg ibuprofen every 8 hours, but it is not touching the pain of his shingles outbreak. Please advise. Andree Coss, RN

## 2018-07-21 NOTE — Telephone Encounter (Signed)
Patient called back, was given Dr Trevor Mace advice on how the valtrex will work and to alternate tylenol/ibuprofen.  He is unable to prescribe gabapentin due to patient's kidney function.  Patient verbalized understanding.

## 2018-07-21 NOTE — Telephone Encounter (Signed)
He was prescribed valtrex for his shingles. It will help reduce his pain in the next day or 2, but I do not prescribe controlled substances for this. He may take tylenol PRN. OTW, he will have to contact his PCP.

## 2018-07-25 LAB — CBC WITH DIFFERENTIAL/PLATELET
Absolute Monocytes: 664 cells/uL (ref 200–950)
Basophils Absolute: 29 cells/uL (ref 0–200)
Basophils Relative: 0.4 %
Eosinophils Absolute: 80 cells/uL (ref 15–500)
Eosinophils Relative: 1.1 %
HCT: 44.3 % (ref 38.5–50.0)
Hemoglobin: 15.2 g/dL (ref 13.2–17.1)
Lymphs Abs: 2168 cells/uL (ref 850–3900)
MCH: 29.8 pg (ref 27.0–33.0)
MCHC: 34.3 g/dL (ref 32.0–36.0)
MCV: 86.9 fL (ref 80.0–100.0)
MPV: 12.1 fL (ref 7.5–12.5)
Monocytes Relative: 9.1 %
Neutro Abs: 4358 cells/uL (ref 1500–7800)
Neutrophils Relative %: 59.7 %
Platelets: 200 10*3/uL (ref 140–400)
RBC: 5.1 10*6/uL (ref 4.20–5.80)
RDW: 15 % (ref 11.0–15.0)
Total Lymphocyte: 29.7 %
WBC: 7.3 10*3/uL (ref 3.8–10.8)

## 2018-07-25 LAB — COMPREHENSIVE METABOLIC PANEL
AG Ratio: 1.3 (calc) (ref 1.0–2.5)
ALT: 13 U/L (ref 9–46)
AST: 17 U/L (ref 10–35)
Albumin: 3.9 g/dL (ref 3.6–5.1)
Alkaline phosphatase (APISO): 69 U/L (ref 35–144)
BUN/Creatinine Ratio: 11 (calc) (ref 6–22)
BUN: 17 mg/dL (ref 7–25)
CO2: 30 mmol/L (ref 20–32)
Calcium: 8.9 mg/dL (ref 8.6–10.3)
Chloride: 105 mmol/L (ref 98–110)
Creat: 1.54 mg/dL — ABNORMAL HIGH (ref 0.70–1.33)
Globulin: 2.9 g/dL (calc) (ref 1.9–3.7)
Glucose, Bld: 151 mg/dL — ABNORMAL HIGH (ref 65–99)
Potassium: 4 mmol/L (ref 3.5–5.3)
Sodium: 140 mmol/L (ref 135–146)
Total Bilirubin: 0.4 mg/dL (ref 0.2–1.2)
Total Protein: 6.8 g/dL (ref 6.1–8.1)

## 2018-07-25 LAB — HIV-1 RNA QUANT-NO REFLEX-BLD
HIV 1 RNA Quant: <20 copies/mL — AB
HIV-1 RNA Quant, Log: <1.3 Log copies/mL — AB

## 2018-07-25 LAB — RPR: RPR Ser Ql: NONREACTIVE

## 2018-07-25 LAB — LIPID PANEL
Cholesterol: 208 mg/dL — ABNORMAL HIGH (ref <200)
HDL: 42 mg/dL (ref >=40)
LDL Cholesterol (Calc): 132 mg/dL (calc) — ABNORMAL HIGH
Non-HDL Cholesterol (Calc): 166 mg/dL (calc) — ABNORMAL HIGH (ref <130)
Total CHOL/HDL Ratio: 5 (calc) — ABNORMAL HIGH (ref <5.0)
Triglycerides: 206 mg/dL — ABNORMAL HIGH (ref <150)

## 2018-08-04 ENCOUNTER — Telehealth: Payer: Self-pay

## 2018-08-04 NOTE — Telephone Encounter (Signed)
Called to do COVID -19 Screening. Patient preferred to have visit via telephone. Thanks! 

## 2018-08-05 ENCOUNTER — Ambulatory Visit: Payer: Self-pay | Admitting: Family Medicine

## 2018-08-05 ENCOUNTER — Other Ambulatory Visit: Payer: Self-pay

## 2018-08-05 ENCOUNTER — Other Ambulatory Visit: Payer: Self-pay | Admitting: Internal Medicine

## 2018-08-11 ENCOUNTER — Telehealth: Payer: Self-pay

## 2018-08-11 NOTE — Telephone Encounter (Signed)
Called to do COVID -19 Screening. Patient preferred to have visit via telephone. Thanks! 

## 2018-08-12 ENCOUNTER — Other Ambulatory Visit: Payer: Self-pay

## 2018-08-12 ENCOUNTER — Encounter: Payer: Self-pay | Admitting: Family Medicine

## 2018-08-12 ENCOUNTER — Ambulatory Visit (INDEPENDENT_AMBULATORY_CARE_PROVIDER_SITE_OTHER): Payer: Self-pay | Admitting: Family Medicine

## 2018-08-12 DIAGNOSIS — E119 Type 2 diabetes mellitus without complications: Secondary | ICD-10-CM

## 2018-08-12 DIAGNOSIS — I10 Essential (primary) hypertension: Secondary | ICD-10-CM

## 2018-08-12 MED ORDER — ATORVASTATIN CALCIUM 80 MG PO TABS: 80.0000 mg | ORAL_TABLET | Freq: Every day | ORAL | 2 refills | Status: DC

## 2018-08-12 MED ORDER — METFORMIN HCL 500 MG PO TABS: 500.0000 mg | ORAL_TABLET | Freq: Two times a day (BID) | ORAL | 2 refills | Status: DC

## 2018-08-12 MED ORDER — AMLODIPINE BESYLATE 10 MG PO TABS: ORAL_TABLET | ORAL | 2 refills | Status: DC

## 2018-08-12 MED FILL — ?METFORMIN HCL 500MG TABLET: 500 | 30 days supply | Qty: 60 | Fill #0

## 2018-08-12 MED FILL — ?ATORVASTATIN 40MG TABLET: 40 | 30 days supply | Qty: 60 | Fill #0

## 2018-08-12 MED FILL — ?AMLODIPINE BESYLATE 10 MG: 10 | 30 days supply | Qty: 30 | Fill #0

## 2018-08-12 NOTE — Progress Notes (Signed)
  [facility]  Virtual Visit via Telephone Note  I connected with [name] on 08/12/18 at  2:00 PM EDT by telephone and verified that I am speaking with the correct person using two identifiers.   I discussed the limitations, risks, security and privacy concerns of performing an evaluation and management service by telephone and the availability of in person appointments. I also discussed with the patient that there may be a patient responsible charge related to this service. The patient expressed understanding and agreed to proceed.   History of Present Illness: Donald Wheeler  has a past medical history of Bell's palsy (09/07/2013), Diabetes mellitus without complication (HCC) (12/2017), HIV (human immunodeficiency virus infection) (HCC), Hypertension, and Stroke (HCC) (04/09/2017). Patient reports compliance with medication and denies side effects at the present time. He denies CP, SOB, dizziness or leg swelling.    Observations/Objective: Patient with regular voice tone, rate and rhythm. Speaking calmly and is in no apparent distress.    Assessment and Plan:. 1. HTN (hypertension), benign 2. Diabetes mellitus, new onset (HCC) No medication changes warranted at the present time.       Follow Up Instructions:  We discussed hand washing, using hand sanitizer when soap and water are not available, only going out when absolutely necessary, and social distancing. Explained to patient that he is immunocompromised and will need to take precautions during this time.   I discussed the assessment and treatment plan with the patient. The patient was provided an opportunity to ask questions and all were answered. The patient agreed with the plan and demonstrated an understanding of the instructions.   The patient was advised to call back or seek an in-person evaluation if the symptoms worsen or if the condition fails to improve as anticipated.   I provided 10 minutes of non-face-to-face time during this encounter.  Ms. Andr L. Riley Lam, FNP-BC [facility] [address] [place], [place] [address] [phone]

## 2018-08-15 ENCOUNTER — Other Ambulatory Visit: Payer: Self-pay

## 2018-08-15 ENCOUNTER — Ambulatory Visit (INDEPENDENT_AMBULATORY_CARE_PROVIDER_SITE_OTHER): Payer: Self-pay | Admitting: Internal Medicine

## 2018-08-15 ENCOUNTER — Encounter: Payer: Self-pay | Admitting: Internal Medicine

## 2018-08-15 DIAGNOSIS — N183 Chronic kidney disease, stage 3 unspecified: Secondary | ICD-10-CM

## 2018-08-15 DIAGNOSIS — B20 Human immunodeficiency virus [HIV] disease: Secondary | ICD-10-CM

## 2018-08-15 DIAGNOSIS — B029 Zoster without complications: Secondary | ICD-10-CM

## 2018-08-15 MED ORDER — GABAPENTIN 300 MG PO CAPS: 300.0000 mg | ORAL_CAPSULE | Freq: Two times a day (BID) | ORAL | 6 refills | Status: DC

## 2018-08-15 NOTE — Progress Notes (Signed)
Rfv: hiv disease- tele visit  Patient ID: Donald Wheeler, male   DOB: 08-Aug-1970, 47 y.o.   MRN: [id number]  HPI 48yo M with hiv disease, cd 4 count of 350/VL<20 , iidm, hld. in early may Had shingles a couple weeks given meds- on abd and wrapping around back on right side. Still having some discomfort associated with shingles. He denies any other issues with health. Has not had any contact with covid-19 or reports any symptoms suggesting covid-19  Outpatient Encounter Medications as of 08/15/2018  Medication Sig  . amLODipine (NORVASC) 10 MG tablet TAKE 1 TABLET(10 MG) BY MOUTH DAILY  . aspirin EC 81 MG tablet Take 1 tablet (81 mg total) by mouth daily.  Marland Kitchen atorvastatin (LIPITOR) 80 MG tablet Take 1 tablet (80 mg total) by mouth daily at 6 PM for 30 days.  Marland Kitchen BIKTARVY 50-200-25 MG TABS tablet TAKE 1 TABLET BY MOUTH DAILY  . blood glucose meter kit and supplies KIT Dispense based on patient and insurance preference. Use up to four times daily as directed. (FOR ICD-9 250.00, 250.01).  . Insulin Detemir (LEVEMIR) 100 UNIT/ML Pen Inject 25 Units into the skin daily.  . metFORMIN (GLUCOPHAGE) 500 MG tablet Take 1 tablet (500 mg total) by mouth 2 (two) times daily with a meal.   No facility-administered encounter medications on file as of 08/15/2018.      Patient Active Problem List   Diagnosis Date Noted  . Hyperosmolar non-ketotic state in patient with type 2 diabetes mellitus ([place]) 12/23/2017  . Diabetes mellitus, new onset ([place]) 12/23/2017  . History of hematuria 08/16/2017  . Mixed hyperlipidemia 06/16/2017  . 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 Solitary pulmonary nodule 04/10/2017  . Focal neurological deficit 04/09/2017  . CKD (chronic kidney disease) stage 3, GFR 30-59 ml/min (HCC) 04/09/2017  . Smoker 04/09/2017  . HTN (hypertension), benign 09/07/2013  . HIV (human immunodeficiency virus infection) ([place]) 03/17/2012     Health Maintenance Due  Topic Date  Due  . OPHTHALMOLOGY EXAM  03/16/1978  . URINE MICROALBUMIN  03/16/1978  . COLONOSCOPY  03/16/2018  . HEMOGLOBIN A1C  08/04/2018    Social History   Tobacco Use  . Smoking status: Current Every Day Smoker    Packs/day: 0.20    Years: 15.00    Pack years: 3.00    Types: Cigars    Last attempt to quit: 12/12/2017    Years since quitting: 0.6  . Smokeless tobacco: Never Used  . Tobacco comment: black and mildsl daily  Substance Use Topics  . Alcohol use: Yes    Comment: 04/09/2017 "might have 2 beers/month; if that"  . Drug use: Yes    Types: Marijuana    Comment: 04/09/2017 "maybe 10 times/year"   Review of Systems Review of Systems  Constitutional: Negative for fever, chills, diaphoresis, activity change, appetite change, fatigue and unexpected weight change.  HENT: Negative for congestion, sore throat, rhinorrhea, sneezing, trouble swallowing and sinus pressure.  Eyes: Negative for photophobia and visual disturbance.  Respiratory: Negative for cough, chest tightness, shortness of breath, wheezing and stridor.  Cardiovascular: Negative for chest pain, palpitations and leg swelling.  Gastrointestinal: Negative for nausea, vomiting, abdominal pain, diarrhea, constipation, blood in stool, abdominal distention and anal bleeding.  Genitourinary: Negative for dysuria, hematuria, flank pain and difficulty urinating.  Musculoskeletal: Negative for myalgias, back pain, joint swelling, arthralgias and gait problem.  Skin: Negative for color change, pallor, rash and wound.  Neurological: Negative for dizziness, tremors,  weakness and light-headedness.  Hematological: Negative for adenopathy. Does not bruise/bleed easily.  Psychiatric/Behavioral: Negative for behavioral problems, confusion, sleep disturbance, dysphoric mood, decreased concentration and agitation.    Physical Exam   There were no vitals taken for this visit.  Did not conduct examine since tele visit Lab Results  Component  Value Date   CD4TCELL 18 (L) 07/20/2018   Lab Results  Component Value Date   CD4TABS 345 (L) 07/20/2018   CD4TABS 300 (L) 12/22/2017   CD4TABS 340 (L) 08/16/2017   Lab Results  Component Value Date   HIV1RNAQUANT <20 DETECTED (A) 07/20/2018   Lab Results  Component Value Date   HEPBSAB NONREACTIVE 03/03/2012   Lab Results  Component Value Date   LABRPR NON-REACTIVE 07/20/2018    CBC Lab Results  Component Value Date   WBC 7.3 07/20/2018   RBC 5.10 07/20/2018   HGB 15.2 07/20/2018   HCT 44.3 07/20/2018   PLT 200 07/20/2018   MCV 86.9 07/20/2018   MCH 29.8 07/20/2018   MCHC 34.3 07/20/2018   RDW 15.0 07/20/2018   LYMPHSABS 2,168 07/20/2018   MONOABS 0.8 04/09/2017   EOSABS 80 07/20/2018    BMET Lab Results  Component Value Date   NA 140 07/20/2018   K 4.0 07/20/2018   CL 105 07/20/2018   CO2 30 07/20/2018   GLUCOSE 151 (H) 07/20/2018   BUN 17 07/20/2018   CREATININE 1.54 (H) 07/20/2018   CALCIUM 8.9 07/20/2018   GFRNONAA 67 12/30/2017   GFRAA 77 12/30/2017      Assessment and Plan  hiv disease = will plan to continue biktary  Long term medication management = will check cr next month to ensure stable  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

## 2018-08-16 ENCOUNTER — Other Ambulatory Visit: Payer: Self-pay | Admitting: Family Medicine

## 2018-08-16 DIAGNOSIS — E119 Type 2 diabetes mellitus without complications: Secondary | ICD-10-CM

## 2018-08-16 DIAGNOSIS — I10 Essential (primary) hypertension: Secondary | ICD-10-CM

## 2018-09-16 ENCOUNTER — Other Ambulatory Visit: Payer: Self-pay | Admitting: Family Medicine

## 2018-11-14 ENCOUNTER — Ambulatory Visit: Payer: Self-pay | Admitting: Family Medicine

## 2018-11-15 ENCOUNTER — Other Ambulatory Visit: Payer: Self-pay | Admitting: Family Medicine

## 2018-11-15 DIAGNOSIS — E119 Type 2 diabetes mellitus without complications: Secondary | ICD-10-CM

## 2018-11-15 DIAGNOSIS — I10 Essential (primary) hypertension: Secondary | ICD-10-CM

## 2018-11-23 ENCOUNTER — Encounter (HOSPITAL_COMMUNITY): Payer: Self-pay | Admitting: *Deleted

## 2018-11-23 ENCOUNTER — Encounter (HOSPITAL_COMMUNITY): Payer: Self-pay

## 2018-12-13 ENCOUNTER — Other Ambulatory Visit: Payer: Self-pay

## 2018-12-13 MED ORDER — LEVEMIR FLEXTOUCH 100 UNIT/ML ~~LOC~~ SOPN: PEN_INJECTOR | SUBCUTANEOUS | 0 refills | Status: DC

## 2018-12-15 ENCOUNTER — Other Ambulatory Visit: Payer: Self-pay

## 2018-12-15 MED ORDER — PEN NEEDLES 31G X 8 MM MISC: 1.0000 | 11 refills | Status: DC

## 2019-01-23 ENCOUNTER — Telehealth: Payer: Self-pay | Admitting: Pharmacy Technician

## 2019-01-23 NOTE — Telephone Encounter (Signed)
[facility] Patient Advocate Encounter  Received notification via fax from [employer] that the patient has been withdrawn from their program. His enrollment period was 12/23/2017-12/24/2018. He will need to re-enroll or update his [facility] application. [phone] for any questions relating to [facility].

## 2019-02-07 ENCOUNTER — Encounter: Payer: Self-pay | Admitting: Internal Medicine

## 2019-02-11 ENCOUNTER — Emergency Department (HOSPITAL_COMMUNITY): Payer: Self-pay

## 2019-02-11 ENCOUNTER — Emergency Department (HOSPITAL_COMMUNITY)
Admission: EM | Admit: 2019-02-11 | Discharge: 2019-02-11 | Disposition: A | Discharge status: Home / Self Care | Payer: Self-pay | Attending: Emergency Medicine | Admitting: Emergency Medicine

## 2019-02-11 ENCOUNTER — Encounter (HOSPITAL_COMMUNITY): Payer: Self-pay

## 2019-02-11 ENCOUNTER — Other Ambulatory Visit: Payer: Self-pay

## 2019-02-11 DIAGNOSIS — Z794 Long term (current) use of insulin: Secondary | ICD-10-CM | POA: Insufficient documentation

## 2019-02-11 DIAGNOSIS — E1122 Type 2 diabetes mellitus with diabetic chronic kidney disease: Secondary | ICD-10-CM | POA: Insufficient documentation

## 2019-02-11 DIAGNOSIS — I129 Hypertensive chronic kidney disease with stage 1 through stage 4 chronic kidney disease, or unspecified chronic kidney disease: Secondary | ICD-10-CM | POA: Insufficient documentation

## 2019-02-11 DIAGNOSIS — N183 Chronic kidney disease, stage 3 unspecified: Secondary | ICD-10-CM | POA: Insufficient documentation

## 2019-02-11 DIAGNOSIS — Z20828 Contact with and (suspected) exposure to other viral communicable diseases: Secondary | ICD-10-CM | POA: Insufficient documentation

## 2019-02-11 DIAGNOSIS — J069 Acute upper respiratory infection, unspecified: Secondary | ICD-10-CM | POA: Insufficient documentation

## 2019-02-11 DIAGNOSIS — Z21 Asymptomatic human immunodeficiency virus [HIV] infection status: Secondary | ICD-10-CM | POA: Insufficient documentation

## 2019-02-11 DIAGNOSIS — F17210 Nicotine dependence, cigarettes, uncomplicated: Secondary | ICD-10-CM | POA: Insufficient documentation

## 2019-02-11 DIAGNOSIS — Z7982 Long term (current) use of aspirin: Secondary | ICD-10-CM | POA: Insufficient documentation

## 2019-02-11 DIAGNOSIS — Z79899 Other long term (current) drug therapy: Secondary | ICD-10-CM | POA: Insufficient documentation

## 2019-02-11 LAB — CBC WITH DIFFERENTIAL/PLATELET
Abs Immature Granulocytes: 0.03 10*3/uL (ref 0.00–0.07)
Basophils Absolute: 0 10*3/uL (ref 0.0–0.1)
Basophils Relative: 0 %
Eosinophils Absolute: 0.2 10*3/uL (ref 0.0–0.5)
Eosinophils Relative: 2 %
HCT: 45.4 % (ref 39.0–52.0)
Hemoglobin: 15.3 g/dL (ref 13.0–17.0)
Immature Granulocytes: 0 %
Lymphocytes Relative: 19 %
Lymphs Abs: 1.9 10*3/uL (ref 0.7–4.0)
MCH: 29.8 pg (ref 26.0–34.0)
MCHC: 33.7 g/dL (ref 30.0–36.0)
MCV: 88.3 fL (ref 80.0–100.0)
Monocytes Absolute: 1.1 10*3/uL — ABNORMAL HIGH (ref 0.1–1.0)
Monocytes Relative: 11 %
Neutro Abs: 6.8 10*3/uL (ref 1.7–7.7)
Neutrophils Relative %: 68 %
Platelets: 162 10*3/uL (ref 150–400)
RBC: 5.14 MIL/uL (ref 4.22–5.81)
RDW: 13.7 % (ref 11.5–15.5)
WBC: 10 10*3/uL (ref 4.0–10.5)
nRBC: 0 % (ref 0.0–0.2)

## 2019-02-11 LAB — BASIC METABOLIC PANEL
Anion gap: 7 (ref 5–15)
BUN: 14 mg/dL (ref 6–20)
CO2: 27 mmol/L (ref 22–32)
Calcium: 8.1 mg/dL — ABNORMAL LOW (ref 8.9–10.3)
Chloride: 103 mmol/L (ref 98–111)
Creatinine, Ser: 1.45 mg/dL — ABNORMAL HIGH (ref 0.61–1.24)
GFR calc Af Amer: >60 mL/min (ref >60)
GFR calc non Af Amer: 56 mL/min — ABNORMAL LOW (ref >60)
Glucose, Bld: 133 mg/dL — ABNORMAL HIGH (ref 70–99)
Potassium: 3.8 mmol/L (ref 3.5–5.1)
Sodium: 137 mmol/L (ref 135–145)

## 2019-02-11 LAB — SARS CORONAVIRUS 2 (TAT 6-24 HRS): SARS Coronavirus 2: NEGATIVE

## 2019-02-11 MED ORDER — PROMETHAZINE-DM 6.25-15 MG/5ML PO SYRP: 5.0000 mL | ORAL_SOLUTION | Freq: Four times a day (QID) | ORAL | 0 refills | Status: DC | PRN

## 2019-02-11 MED ORDER — GUAIFENESIN ER 1200 MG PO TB12: 1.0000 | ORAL_TABLET | Freq: Two times a day (BID) | ORAL | 0 refills | Status: DC

## 2019-02-11 NOTE — ED Provider Notes (Signed)
[facility] DEPT Provider Note   CSN: [id number] Arrival date & time: 02/11/19  2831     History   Chief Complaint Chief Complaint  Patient presents with  . Nasal Congestion  . Cough  . Headache    HPI Donald Wheeler is a 48 y.o. male.     HPI Patient presents to the emergency department with nasal congestion and cough over the last 5 days.  The patient states that the nasal congestion is his main symptom with an occasional cough.  The patient states he is not coughed anything up of any significance.  Patient states that he may have had a low-grade temperature but does not know for sure.  Patient states he did not take any medications prior to arrival for his symptoms.  The patient denies chest pain, shortness of breath, headache,blurred vision, neck pain, fever, weakness, numbness, dizziness, anorexia, edema, abdominal pain, nausea, vomiting, diarrhea, rash, back pain, dysuria, hematemesis, bloody stool, near syncope, or syncope. Past Medical History:  Diagnosis Date  . Bell's palsy 09/07/2013  . Diabetes mellitus without complication ([facility]) 51/7616   NEW ONSET   . HIV (human immunodeficiency virus infection) ([facility])    dx'd 2000s  . Hypertension   . Stroke ([facility]) 04/09/2017   vs TIA w/acute onset of right-sided weakness as well as trouble speaking/notes 04/09/2017    Patient Active Problem List   Diagnosis Date Noted  . Shingles 07/2018  . Hyperosmolar non-ketotic state in patient with type 2 diabetes mellitus ([facility]) 12/23/2017  . Diabetes mellitus, new onset ([facility]) 12/23/2017  . History of hematuria 08/16/2017  . Mixed hyperlipidemia 06/16/2017  . 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 Solitary pulmonary nodule 04/10/2017  . Focal neurological deficit 04/09/2017  . CKD (chronic kidney disease) stage 3, GFR 30-59 ml/min 04/09/2017  . Smoker 04/09/2017  . HTN (hypertension), benign 09/07/2013  . HIV (human immunodeficiency  virus infection) ([facility]) 03/17/2012    Past Surgical History:  Procedure Laterality Date  . NO PAST SURGERIES          Home Medications    Prior to Admission medications   Medication Sig Start Date End Date Taking? Authorizing Provider  amLODipine (NORVASC) 10 MG tablet TAKE 1 TABLET(10 MG) BY MOUTH DAILY Patient taking differently: Take 10 mg by mouth daily. TAKE 1 TABLET(10 MG) BY MOUTH DAILY 11/15/18  Yes Lanae Boast, FNP  aspirin EC 81 MG tablet Take 1 tablet (81 mg total) by mouth daily. 07/20/18  Yes Lanae Boast, FNP  atorvastatin (LIPITOR) 80 MG tablet TAKE 1 TABLET(80 MG) BY MOUTH DAILY AT 6 PM Patient taking differently: Take 80 mg by mouth daily at 6 PM.  11/15/18  Yes Lanae Boast, FNP  BIKTARVY 50-200-25 MG TABS tablet TAKE 1 TABLET BY MOUTH DAILY Patient taking differently: Take 1 tablet by mouth daily.  08/05/18  Yes Carlyle Basques, MD  Insulin Detemir (LEVEMIR FLEXTOUCH) 100 UNIT/ML Pen ADMINISTER 25 UNITS UNDER THE SKIN DAILY *MUST MAKE APPT FOR REFILLS* Patient taking differently: Inject 26 Units into the skin daily. ADMINISTER 25 UNITS UNDER THE SKIN DAILY *MUST MAKE APPT FOR REFILLS* 12/13/18  Yes Jegede, Olugbemiga E, MD  metFORMIN (GLUCOPHAGE) 500 MG tablet TAKE 1 TABLET(500 MG) BY MOUTH TWICE DAILY WITH A MEAL Patient taking differently: Take 500 mg by mouth daily with breakfast.  11/15/18  Yes Lanae Boast, FNP  blood glucose meter kit and supplies KIT Dispense based on patient and insurance preference. Use up to four times  daily as directed. (FOR ICD-9 250.00, 250.01). 12/24/17   Geradine Girt, DO  gabapentin (NEURONTIN) 300 MG capsule Take 1 capsule (300 mg total) by mouth 2 (two) times daily. For the first week, just take 1 tab at bedtime, then increase to twice a day Patient not taking: Reported on 02/11/2019 08/15/18   Carlyle Basques, MD  Insulin Pen Needle (PEN NEEDLES) 31G X 8 MM MISC 1 each by Does not apply route as directed. 12/15/18   Tresa Garter, MD    Family History Family History  Problem Relation Age of Onset  . Hypertension Mother   . Diabetes Mother     Social History Social History   Tobacco Use  . Smoking status: Current Every Day Smoker    Packs/day: 0.20    Years: 15.00    Pack years: 3.00    Types: Cigars    Last attempt to quit: 12/12/2017    Years since quitting: 1.1  . Smokeless tobacco: Never Used  . Tobacco comment: black and mildsl daily  Substance Use Topics  . Alcohol use: Yes    Comment: 04/09/2017 "might have 2 beers/month; if that"  . Drug use: Yes    Types: Marijuana     Allergies   Patient has no known allergies.   Review of Systems Review of Systems All other systems negative except as documented in the HPI. All pertinent positives and negatives as reviewed in the HPI.  Physical Exam Updated Vital Signs BP (!) 159/107 (BP Location: Right Arm)   Pulse 65   Temp 98.8 F (37.1 C) (Oral)   Resp 17   Ht _0  (1.702 m)   Wt 89.8 kg   SpO2 98%   BMI 31.01 kg/m   Physical Exam Vitals signs and nursing note reviewed.  Constitutional:      General: He is not in acute distress.    Appearance: He is well-developed.  HENT:     Head: Normocephalic and atraumatic.  Eyes:     Pupils: Pupils are equal, round, and reactive to light.  Neck:     Musculoskeletal: Normal range of motion and neck supple.  Cardiovascular:     Rate and Rhythm: Normal rate and regular rhythm.     Heart sounds: Normal heart sounds. No murmur. No friction rub. No gallop.   Pulmonary:     Effort: Pulmonary effort is normal. No respiratory distress.     Breath sounds: Normal breath sounds. No wheezing.  Abdominal:     General: Bowel sounds are normal. There is no distension.     Palpations: Abdomen is soft.     Tenderness: There is no abdominal tenderness.  Skin:    General: Skin is warm and dry.     Capillary Refill: Capillary refill takes less than 2 seconds.     Findings: No erythema or rash.   Neurological:     Mental Status: He is alert and oriented to person, place, and time.     Motor: No abnormal muscle tone.     Coordination: Coordination normal.  Psychiatric:        Behavior: Behavior normal.      ED Treatments / Results  Labs (all labs ordered are listed, but only abnormal results are displayed) Labs Reviewed  BASIC METABOLIC PANEL - Abnormal; Notable for the following components:      Result Value   Glucose, Bld 133 (*)    Creatinine, Ser 1.45 (*)    Calcium 8.1 (*)  GFR calc non Af Amer 56 (*)    All other components within normal limits  CBC WITH DIFFERENTIAL/PLATELET - Abnormal; Notable for the following components:   Monocytes Absolute 1.1 (*)    All other components within normal limits  SARS CORONAVIRUS 2 (TAT 6-24 HRS)    EKG None  Radiology Dg Chest 2 View  Result Date: 02/11/2019 CLINICAL DATA:  Cough and congestion EXAM: CHEST - 2 VIEW COMPARISON:  September 21, 2005 FINDINGS: Lungs are clear. Heart size and pulmonary vascularity are normal. No adenopathy. No bone lesions. IMPRESSION: No edema or consolidation. Electronically Signed   By: Lowella Grip III M.D.   On: 02/11/2019 10:00    Procedures Procedures (including critical care time)  Medications Ordered in ED Medications - No data to display   Initial Impression / Assessment and Plan / ED Course  I have reviewed the triage vital signs and the nursing notes.  Pertinent labs & imaging results that were available during my care of the patient were reviewed by me and considered in my medical decision making (see chart for details).       Patient is advised to quarantine until his Covid results are back.  The patient is advised to return here for any worsening in his condition.  The patient's vital signs remained stable here in the emergency department.  Patient is advised to also follow-up with his primary doctor and increase his fluid intake and rest as much as possible.  Final  Clinical Impressions(s) / ED Diagnoses   Final diagnoses:  None    ED Discharge Orders    None       Rebeca Allegra 02/11/19 1221    Lacretia Leigh, MD 02/11/19 1443

## 2019-02-11 NOTE — Discharge Instructions (Signed)
Return here as needed.  I would advise you to quarantine until your Covid test results are back.  This still could represent another illness.  Increase your fluid intake and rest as much as possible.

## 2019-02-11 NOTE — ED Triage Notes (Signed)
Patient c/o nasal congestion, a productive cough with yellow sputum, and a headache since yesterday. T. 100.2 in triage.

## 2019-02-13 ENCOUNTER — Emergency Department (HOSPITAL_COMMUNITY): Admission: EM | Admit: 2019-02-13 | Discharge: 2019-02-13 | Discharge status: Absent without leave | Payer: MEDICAID

## 2019-03-07 ENCOUNTER — Telehealth: Payer: Self-pay

## 2019-03-07 ENCOUNTER — Other Ambulatory Visit: Payer: Self-pay

## 2019-03-07 ENCOUNTER — Other Ambulatory Visit: Payer: Self-pay | Admitting: Internal Medicine

## 2019-03-07 NOTE — Telephone Encounter (Signed)
Patient requesting refill Levemir Flex Touch Pen. Please advise or fill. Thank you.

## 2019-03-08 ENCOUNTER — Other Ambulatory Visit: Payer: Self-pay | Admitting: Family Medicine

## 2019-03-08 MED ORDER — LEVEMIR FLEXTOUCH 100 UNIT/ML ~~LOC~~ SOPN: PEN_INJECTOR | SUBCUTANEOUS | 0 refills | Status: DC

## 2019-03-08 NOTE — Progress Notes (Signed)
Meds ordered this encounter  Medications  . Insulin Detemir (LEVEMIR FLEXTOUCH) 100 UNIT/ML Pen    Sig: ADMINISTER 25 UNITS UNDER THE SKIN DAILY *MUST MAKE APPT FOR REFILLS*    Dispense:  15 mL    Refill:  0    Order Specific Question:   Supervising Provider    Answer:   Tresa Garter [[id number]]    Donia Pounds  APRN, MSN, FNP-C [facility] [address] [place], [place] [address] [phone]

## 2019-03-15 ENCOUNTER — Telehealth: Payer: Self-pay | Admitting: Family Medicine

## 2019-03-15 ENCOUNTER — Telehealth: Payer: Self-pay

## 2019-03-15 MED ORDER — LEVEMIR FLEXTOUCH 100 UNIT/ML ~~LOC~~ SOPN: PEN_INJECTOR | SUBCUTANEOUS | 0 refills | Status: DC

## 2019-03-15 NOTE — Telephone Encounter (Signed)
Pt has assistance for Lantus Solostar-if appropriate, can you change his Levemir to Lantus and resend script?

## 2019-03-15 NOTE — Telephone Encounter (Signed)
Refill sent to pharmacy.   

## 2019-03-31 ENCOUNTER — Ambulatory Visit: Payer: Self-pay | Admitting: Family Medicine

## 2019-04-03 ENCOUNTER — Other Ambulatory Visit: Payer: Self-pay | Admitting: Family Medicine

## 2019-04-03 DIAGNOSIS — E119 Type 2 diabetes mellitus without complications: Secondary | ICD-10-CM

## 2019-04-03 DIAGNOSIS — I10 Essential (primary) hypertension: Secondary | ICD-10-CM

## 2019-05-05 ENCOUNTER — Other Ambulatory Visit: Payer: Self-pay

## 2019-05-05 DIAGNOSIS — E119 Type 2 diabetes mellitus without complications: Secondary | ICD-10-CM

## 2019-05-05 MED ORDER — ATORVASTATIN CALCIUM 80 MG PO TABS: ORAL_TABLET | ORAL | 0 refills | Status: DC

## 2019-05-08 ENCOUNTER — Ambulatory Visit (INDEPENDENT_AMBULATORY_CARE_PROVIDER_SITE_OTHER): Payer: Self-pay | Admitting: Nurse Practitioner

## 2019-05-08 ENCOUNTER — Other Ambulatory Visit: Payer: Self-pay | Admitting: Nurse Practitioner

## 2019-05-08 ENCOUNTER — Encounter: Payer: Self-pay | Admitting: Nurse Practitioner

## 2019-05-08 ENCOUNTER — Other Ambulatory Visit: Payer: Self-pay

## 2019-05-08 VITALS — BP 150/94 | HR 68 | Temp 98.8°F | Resp 16 | Ht 66.0 in | Wt 200.0 lb

## 2019-05-08 DIAGNOSIS — E1165 Type 2 diabetes mellitus with hyperglycemia: Secondary | ICD-10-CM

## 2019-05-08 DIAGNOSIS — R4589 Other symptoms and signs involving emotional state: Secondary | ICD-10-CM

## 2019-05-08 DIAGNOSIS — I10 Essential (primary) hypertension: Secondary | ICD-10-CM

## 2019-05-08 DIAGNOSIS — B20 Human immunodeficiency virus [HIV] disease: Secondary | ICD-10-CM

## 2019-05-08 DIAGNOSIS — R310 Gross hematuria: Secondary | ICD-10-CM

## 2019-05-08 DIAGNOSIS — Z716 Tobacco abuse counseling: Secondary | ICD-10-CM

## 2019-05-08 DIAGNOSIS — Z0000 Encounter for general adult medical examination without abnormal findings: Secondary | ICD-10-CM

## 2019-05-08 LAB — POCT GLYCOSYLATED HEMOGLOBIN (HGB A1C): Hemoglobin A1C: 5.9 % — AB (ref 4.0–5.6)

## 2019-05-08 LAB — POCT URINALYSIS DIPSTICK
Bilirubin, UA: NEGATIVE
Glucose, UA: NEGATIVE
Ketones, UA: NEGATIVE
Leukocytes, UA: NEGATIVE
Nitrite, UA: NEGATIVE
Protein, UA: POSITIVE — AB
Spec Grav, UA: 1.02 (ref 1.010–1.025)
Urobilinogen, UA: 0.2 E.U./dL
pH, UA: 5.5 (ref 5.0–8.0)

## 2019-05-08 LAB — GLUCOSE, POCT (MANUAL RESULT ENTRY): POC Glucose: 68 mg/dl — AB (ref 70–99)

## 2019-05-08 NOTE — Patient Instructions (Signed)
Diabetic Retinopathy Diabetic retinopathy is a disease of the retina. The retina is a light-sensitive membrane at the back of the eye. Retinopathy is a complication of diabetes (diabetes mellitus) and a common cause of bad eyesight (visual impairment). It can eventually cause blindness. Early detection and treatment of diabetic retinopathy is important in keeping your eyes healthy and preventing further damage to them. What are the causes? Diabetic retinopathy is caused by blood sugar (glucose) levels that are too high for an extended period of time. High blood glucose over an extended period of time can:  Damage small blood vessels in the retina, allowing blood to leak through the vessel walls.  Cause new, abnormal blood vessels to grow on the retina. This can scar the retina in the advanced stage of diabetic retinopathy. What increases the risk? You are more likely to develop this condition if:  You have had diabetes for a long time.  You have poorly controlled blood glucose.  You have high blood pressure. What are the signs or symptoms? In the early stages of diabetic retinopathy, there are often no symptoms. As the condition gets worse, symptoms may include:  Blurred vision. This is usually caused by swelling due to abnormal blood glucose levels. The blurriness may go away when blood glucose levels return to normal.  Moving specks or dark spots (floaters) in your vision. These can be caused by a small amount of bleeding (hemorrhage) from retinal blood vessels.  Missing parts of your field of vision, such as vision at the sides of the eyes. This can be caused by larger retinal hemorrhages.  Difficulty reading.  Double vision.  Pain in one or both eyes.  Feeling pressure in one or both eyes.  Trouble seeing straight lines. Straight lines may not look straight.  Redness of the eyes that does not go away. How is this diagnosed? This condition may be diagnosed with an eye exam in  which your eye care specialist puts drops in your eyes that enlarge (dilate) your pupils. This lets your health care provider examine your retina and check for changes in your retinal blood vessels. How is this treated? This condition may be treated by:  Keeping your blood glucose and blood pressure within a target range.  Using a type of laser beam to seal your retinal blood vessels. This stops them from bleeding and decreases pressure in your eye.  Getting shots of medicine in the eye to reduce swelling of the center of the retina (macula). You may be given: ? Anti-VEGF medicine. This medicine can help slow vision loss, and may even improve vision. ? Steroid medicine. Follow these instructions at home:   Follow your diabetes management plan as directed by your health care provider. This may include exercising regularly and eating a healthy diet.  Keep your blood glucose level and your blood pressure in your target range, as directed by your health care provider.  Check your blood glucose as often as directed.  Take over the counter and prescription medicines only as told by your health care provider. This includes insulin and oral diabetes medicine.  Get your eyes checked at least once every year. An eye specialist can usually see diabetic retinopathy developing long before it starts to cause problems. In many cases, it can be treated to prevent complications from occurring.  Do not use any products that contain nicotine or tobacco, such as cigarettes and e-cigarettes. If you need help quitting, ask your health care provider.  Keep all follow-up   visits as told by your health care provider. This is important. Contact a health care provider if:  You notice gradual blurring or other changes in your vision over time.  You notice that your glasses or contact lenses do not make things look as sharp as they once did.  You have trouble reading or seeing details at a distance with either  eye.  You notice a change in your vision or notice that parts of your field of vision appear missing or hazy.  You suddenly see moving specks or dark spots in the field of vision of either eye. Get help right away if:  You have sudden pain or pressure in one or both eyes.  You suddenly lose vision or a curtain or veil seems to come across your eyes.  You have a sudden burst of floaters in your vision. Summary  Diabetic retinopathy is a disease of the retina. The retina is a light-sensitive membrane at the back of the eye. Retinopathy is a complication of diabetes.  Get your eyes checked at least once every year. An eye specialist can usually see diabetic retinopathy developing long before it starts to cause problems. In many cases, it can be treated to prevent complications from occurring.  Keep your blood glucose and your blood pressure in target range. Follow your diabetes management plan as directed by your health care provider.  Protect your eyes. Wear sunglasses and eye protection when needed. This information is not intended to replace advice given to you by your health care provider. Make sure you discuss any questions you have with your health care provider. Document Revised: 04/14/2017 Document Reviewed: 04/06/2016 Elsevier Patient Education  2020 Elsevier Inc.  

## 2019-05-08 NOTE — Progress Notes (Signed)
Established Patient Office Visit  Subjective:  Patient ID: Donald Wheeler, male    DOB: [birth year]  Age: 49 y.o. MRN: [id number]  CC:  Chief Complaint  Patient presents with  . Hypertension  . Diabetes  . Hyperlipidemia  . Eye Problem    eyes are straning     HPI Donald Wheeler presents for follow-up. His  has a past medical history of Bell's palsy (09/07/2013), Diabetes mellitus without complication ([facility]) (10/6759), HIV (human immunodeficiency virus infection) ([facility]), Hypertension, and Stroke ([facility]) (04/09/2017).  Donald Wheeler is currently unemployed and does not have insurance.  Donald Wheeler has not seen an ophthalmologist Donald Wheeler is seeing double with floaters in both eyes.  Donald Wheeler has occasional redness. His vision is limited and Donald Wheeler does occasionally strain.  Donald Wheeler is using reading glasses which are somewhat effective.  Donald Wheeler admits this has been going on for a few years.   His fasting blood glucose ranges from the 90s to 160s.  Donald Wheeler does try to monitor his diet.  Donald Wheeler is walking daily.  Donald Wheeler does continue to smoke.  Donald Wheeler is smoking "black in mild."  His blood pressure is slightly elevated which may be related to ongoing stress.  Donald Wheeler is feeling down and depressed because of these change in work status and lack of income.  Donald Wheeler is not sure when Donald Wheeler will start back working.  Donald Wheeler does have family support.  Donald Wheeler speaks often with his sister who also has diabetes.  Donald Wheeler has 2 sons and 6 grandchildren that are a part of his life. Denies headache, dizziness, visual changes, dyspnea on exertion, chest pain, nausea, vomiting or any edema.    Past Medical History:  Diagnosis Date  . Bell's palsy 09/07/2013  . Diabetes mellitus without complication ([facility]) 95/0932   NEW ONSET   . HIV (human immunodeficiency virus infection) ([facility])    dx'd 2000s  . Hypertension   . Stroke ([facility]) 04/09/2017   vs TIA w/acute onset of right-sided weakness as well as trouble speaking/notes 04/09/2017    Past Surgical History:  Procedure Laterality Date  . NO PAST  SURGERIES      Family History  Problem Relation Age of Onset  . Hypertension Mother   . Diabetes Mother     Social History   Socioeconomic History  . Marital status: Divorced    Spouse name: Not on file  . Number of children: Not on file  . Years of education: Not on file  . Highest education level: Not on file  Occupational History  . Not on file  Tobacco Use  . Smoking status: Current Every Day Smoker    Packs/day: 0.20    Years: 15.00    Pack years: 3.00    Types: Cigars    Last attempt to quit: 12/12/2017    Years since quitting: 1.4  . Smokeless tobacco: Never Used  . Tobacco comment: black and mildsl daily  Substance and Sexual Activity  . Alcohol use: Yes    Comment: 04/09/2017 "might have 2 beers/month; if that"  . Drug use: Yes    Types: Marijuana  . Sexual activity: Not Currently    Partners: Female    Birth control/protection: None, Condom    Comment: patient given condoms  Other Topics Concern  . Not on file  Social History Narrative  . Not on file   Social Determinants of Health   Financial Resource Strain:   . Difficulty of Paying Living Expenses: Not on file  Food Insecurity:   .  Worried About Charity fundraiser in the Last Year: Not on file  . Ran Out of Food in the Last Year: Not on file  Transportation Needs:   . Lack of Transportation (Medical): Not on file  . Lack of Transportation (Non-Medical): Not on file  Physical Activity:   . Days of Exercise per Week: Not on file  . Minutes of Exercise per Session: Not on file  Stress:   . Feeling of Stress : Not on file  Social Connections:   . Frequency of Communication with Friends and Family: Not on file  . Frequency of Social Gatherings with Friends and Family: Not on file  . Attends Religious Services: Not on file  . Active Member of Clubs or Organizations: Not on file  . Attends Archivist Meetings: Not on file  . Marital Status: Not on file  Intimate Partner Violence:   .  Fear of Current or Ex-Partner: Not on file  . Emotionally Abused: Not on file  . Physically Abused: Not on file  . Sexually Abused: Not on file    Outpatient Medications Prior to Visit  Medication Sig Dispense Refill  . amLODipine (NORVASC) 10 MG tablet TAKE 1 TABLET(10 MG) BY MOUTH DAILY.Needs Apt for more refills! 30 tablet 0  . aspirin EC 81 MG tablet Take 1 tablet (81 mg total) by mouth daily. 100 tablet 3  . atorvastatin (LIPITOR) 80 MG tablet Needs Apt for more refills! 30 tablet 0  . BIKTARVY 50-200-25 MG TABS tablet TAKE 1 TABLET BY MOUTH DAILY 30 tablet 4  . blood glucose meter kit and supplies KIT Dispense based on patient and insurance preference. Use up to four times daily as directed. (FOR ICD-9 250.00, 250.01). 1 each 0  . Insulin Detemir (LEVEMIR FLEXTOUCH) 100 UNIT/ML Pen ADMINISTER 25 UNITS UNDER THE SKIN DAILY 15 mL 0  . Insulin Pen Needle (PEN NEEDLES) 31G X 8 MM MISC 1 each by Does not apply route as directed. 100 each 11  . metFORMIN (GLUCOPHAGE) 500 MG tablet Needs Apt for more refills! 60 tablet 0  . gabapentin (NEURONTIN) 300 MG capsule Take 1 capsule (300 mg total) by mouth 2 (two) times daily. For the first week, just take 1 tab at bedtime, then increase to twice a day (Patient not taking: Reported on 02/11/2019) 60 capsule 6  . Guaifenesin 1200 MG TB12 Take 1 tablet (1,200 mg total) by mouth 2 (two) times daily. 20 tablet 0  . promethazine-dextromethorphan (PROMETHAZINE-DM) 6.25-15 MG/5ML syrup Take 5 mLs by mouth 4 (four) times daily as needed for cough. 120 mL 0   No facility-administered medications prior to visit.    No Known Allergies  ROS Review of Systems  Constitutional: Negative.   HENT: Negative.   Respiratory: Positive for shortness of breath.   Cardiovascular: Negative.   Gastrointestinal: Negative.   Endocrine: Negative.   Genitourinary: Negative.   Musculoskeletal: Negative.   Skin: Negative.   Allergic/Immunologic: Negative.     Neurological: Negative.   Hematological: Negative.       Objective:    Physical Exam  Constitutional: Donald Wheeler appears well-developed.  HENT:  Head: Normocephalic.  Cardiovascular: Normal rate, regular rhythm, normal heart sounds and intact distal pulses.  Pulmonary/Chest: Effort normal and breath sounds normal.  Abdominal: Soft. Bowel sounds are normal.  Musculoskeletal:        General: Normal range of motion.     Cervical back: Normal range of motion.       Feet:  Neurological: Donald Wheeler is alert.  Skin: Skin is warm and dry.  Psychiatric: Donald Wheeler has a normal mood and affect. His behavior is normal. Judgment and thought content normal.    BP (!) 150/94 (BP Location: Right Arm, Patient Position: Sitting, Cuff Size: Large)   Pulse 68   Temp 98.8 F (37.1 C) (Oral)   Resp 16   Ht _0  (1.676 m)   Wt 200 lb (90.7 kg)   SpO2 98%   BMI 32.28 kg/m  Wt Readings from Last 3 Encounters:  05/08/19 200 lb (90.7 kg)  02/11/19 198 lb (89.8 kg)  07/20/18 197 lb (89.4 kg)     Health Maintenance Due  Topic Date Due  . OPHTHALMOLOGY EXAM  03/16/1978  . URINE MICROALBUMIN  03/16/1978  . COLONOSCOPY  03/16/2018  . FOOT EXAM  02/04/2019    There are no preventive care reminders to display for this patient.  No results found for: TSH Lab Results  Component Value Date   WBC 10.0 02/11/2019   HGB 15.3 02/11/2019   HCT 45.4 02/11/2019   MCV 88.3 02/11/2019   PLT 162 02/11/2019   Lab Results  Component Value Date   NA 137 02/11/2019   K 3.8 02/11/2019   CO2 27 02/11/2019   GLUCOSE 133 (H) 02/11/2019   BUN 14 02/11/2019   CREATININE 1.45 (H) 02/11/2019   BILITOT 0.4 07/20/2018   ALKPHOS 98 12/30/2017   AST 17 07/20/2018   ALT 13 07/20/2018   PROT 6.8 07/20/2018   ALBUMIN 3.9 12/30/2017   CALCIUM 8.1 (L) 02/11/2019   ANIONGAP 7 02/11/2019   Lab Results  Component Value Date   CHOL 208 (H) 07/20/2018   Lab Results  Component Value Date   HDL 42 07/20/2018   Lab Results   Component Value Date   LDLCALC 132 (H) 07/20/2018   Lab Results  Component Value Date   TRIG 206 (H) 07/20/2018   Lab Results  Component Value Date   CHOLHDL 5.0 (H) 07/20/2018   Lab Results  Component Value Date   HGBA1C 5.9 (A) 05/08/2019      Assessment & Plan:   Problem List Items Addressed This Visit      Unprioritized   HIV (human immunodeficiency virus infection) ([facility])   HTN (hypertension), benign - Primary   Relevant Orders   Urinalysis Dipstick (Completed)   Comp. Metabolic Panel (12)    Other Visit Diagnoses    Uncontrolled type 2 diabetes mellitus with hyperglycemia ([facility])       Continue with current regimen Education provided Labs pending   Relevant Orders   Urinalysis Dipstick (Completed)   HgB A1c (Completed)   Glucose (CBG) (Completed)   Comp. Metabolic Panel (12)   Ambulatory referral to [facility] maintenance       Labs pending   Relevant Orders   CBC with Differential/Platelet   PSA   VITAMIN D 25 Hydroxy (Vit-D Deficiency, Fractures)   Depressed mood       We will continue to monitor Education provided Encouraged continued family support Counseling encouraged   Tobacco abuse counseling       Education provided on smoking and diabetes Smoking cessation discussed      No orders of the defined types were placed in this encounter.   Follow-up: Return in about 3 months (around 08/05/2019).    Vevelyn Francois, NP

## 2019-05-09 LAB — CBC WITH DIFFERENTIAL/PLATELET
Basophils Absolute: 0 10*3/uL (ref 0.0–0.2)
Basos: 0 %
EOS (ABSOLUTE): 0.1 10*3/uL (ref 0.0–0.4)
Eos: 1 %
Hematocrit: 45.4 % (ref 37.5–51.0)
Hemoglobin: 14.9 g/dL (ref 13.0–17.7)
Immature Grans (Abs): 0.1 10*3/uL (ref 0.0–0.1)
Immature Granulocytes: 1 %
Lymphocytes Absolute: 2.3 10*3/uL (ref 0.7–3.1)
Lymphs: 33 %
MCH: 29.6 pg (ref 26.6–33.0)
MCHC: 32.8 g/dL (ref 31.5–35.7)
MCV: 90 fL (ref 79–97)
Monocytes Absolute: 0.6 10*3/uL (ref 0.1–0.9)
Monocytes: 8 %
Neutrophils Absolute: 3.9 10*3/uL (ref 1.4–7.0)
Neutrophils: 57 %
Platelets: 196 10*3/uL (ref 150–450)
RBC: 5.04 x10E6/uL (ref 4.14–5.80)
RDW: 15.7 % — ABNORMAL HIGH (ref 11.6–15.4)
WBC: 6.9 10*3/uL (ref 3.4–10.8)

## 2019-05-09 LAB — COMP. METABOLIC PANEL (12)
AST: 18 IU/L (ref 0–40)
Albumin/Globulin Ratio: 1.8 (ref 1.2–2.2)
Albumin: 4.1 g/dL (ref 3.8–4.9)
Alkaline Phosphatase: 77 IU/L (ref 39–117)
BUN/Creatinine Ratio: 13 (ref 9–20)
BUN: 18 mg/dL (ref 6–24)
Bilirubin Total: 0.3 mg/dL (ref 0.0–1.2)
Calcium: 8.8 mg/dL (ref 8.7–10.2)
Chloride: 107 mmol/L — ABNORMAL HIGH (ref 96–106)
Creatinine, Ser: 1.4 mg/dL — ABNORMAL HIGH (ref 0.76–1.27)
GFR calc Af Amer: 67 mL/min/{1.73_m2} (ref >59)
GFR calc non Af Amer: 58 mL/min/{1.73_m2} — ABNORMAL LOW (ref >59)
Globulin, Total: 2.3 g/dL (ref 1.5–4.5)
Glucose: 61 mg/dL — ABNORMAL LOW (ref 65–99)
Potassium: 4.5 mmol/L (ref 3.5–5.2)
Sodium: 143 mmol/L (ref 134–144)
Total Protein: 6.4 g/dL (ref 6.0–8.5)

## 2019-05-09 LAB — PSA: Prostate Specific Ag, Serum: 3.1 ng/mL (ref 0.0–4.0)

## 2019-05-09 LAB — VITAMIN D 25 HYDROXY (VIT D DEFICIENCY, FRACTURES): Vit D, 25-Hydroxy: 8.7 ng/mL — ABNORMAL LOW (ref 30.0–100.0)

## 2019-05-10 ENCOUNTER — Encounter: Payer: Self-pay | Admitting: Nurse Practitioner

## 2019-05-10 ENCOUNTER — Other Ambulatory Visit: Payer: Self-pay | Admitting: Nurse Practitioner

## 2019-05-10 DIAGNOSIS — E559 Vitamin D deficiency, unspecified: Secondary | ICD-10-CM | POA: Insufficient documentation

## 2019-05-10 LAB — URINE CULTURE: Organism ID, Bacteria: NO GROWTH

## 2019-05-10 MED ORDER — ERGOCALCIFEROL 1.25 MG (50000 UT) PO CAPS: 50000.0000 [IU] | ORAL_CAPSULE | ORAL | 0 refills | Status: AC

## 2019-05-11 ENCOUNTER — Other Ambulatory Visit: Payer: Self-pay

## 2019-05-11 MED ORDER — LEVEMIR FLEXTOUCH 100 UNIT/ML ~~LOC~~ SOPN: PEN_INJECTOR | SUBCUTANEOUS | 3 refills | Status: DC

## 2019-06-12 ENCOUNTER — Other Ambulatory Visit: Payer: Self-pay | Admitting: Nurse Practitioner

## 2019-06-12 DIAGNOSIS — E119 Type 2 diabetes mellitus without complications: Secondary | ICD-10-CM

## 2019-06-26 ENCOUNTER — Other Ambulatory Visit: Payer: Self-pay

## 2019-06-26 MED ORDER — LEVEMIR FLEXTOUCH 100 UNIT/ML ~~LOC~~ SOPN: PEN_INJECTOR | SUBCUTANEOUS | 3 refills | Status: DC

## 2019-07-12 ENCOUNTER — Telehealth: Payer: Self-pay | Admitting: Nurse Practitioner

## 2019-07-12 ENCOUNTER — Ambulatory Visit: Payer: Self-pay

## 2019-07-12 ENCOUNTER — Other Ambulatory Visit: Payer: Self-pay

## 2019-07-12 ENCOUNTER — Encounter: Payer: Self-pay | Admitting: Internal Medicine

## 2019-07-12 ENCOUNTER — Other Ambulatory Visit: Payer: Self-pay | Admitting: Nurse Practitioner

## 2019-07-12 MED ORDER — LEVEMIR FLEXTOUCH 100 UNIT/ML ~~LOC~~ SOPN: PEN_INJECTOR | SUBCUTANEOUS | 3 refills | Status: DC

## 2019-07-12 NOTE — Telephone Encounter (Signed)
Sent!

## 2019-07-17 ENCOUNTER — Other Ambulatory Visit: Payer: Self-pay | Admitting: Nurse Practitioner

## 2019-07-17 DIAGNOSIS — E119 Type 2 diabetes mellitus without complications: Secondary | ICD-10-CM

## 2019-08-04 ENCOUNTER — Other Ambulatory Visit: Payer: Self-pay

## 2019-08-04 ENCOUNTER — Ambulatory Visit (INDEPENDENT_AMBULATORY_CARE_PROVIDER_SITE_OTHER): Payer: Self-pay | Admitting: Nurse Practitioner

## 2019-08-04 VITALS — BP 168/100 | HR 75 | Ht 66.0 in | Wt 193.0 lb

## 2019-08-04 DIAGNOSIS — I10 Essential (primary) hypertension: Secondary | ICD-10-CM

## 2019-08-04 DIAGNOSIS — E118 Type 2 diabetes mellitus with unspecified complications: Secondary | ICD-10-CM

## 2019-08-04 DIAGNOSIS — E1165 Type 2 diabetes mellitus with hyperglycemia: Secondary | ICD-10-CM

## 2019-08-04 LAB — POCT URINALYSIS DIPSTICK
Bilirubin, UA: NEGATIVE
Glucose, UA: NEGATIVE
Ketones, UA: NEGATIVE
Leukocytes, UA: NEGATIVE
Nitrite, UA: NEGATIVE
Protein, UA: POSITIVE — AB
Spec Grav, UA: 1.025 (ref 1.010–1.025)
Urobilinogen, UA: 0.2 E.U./dL
pH, UA: 5.5 (ref 5.0–8.0)

## 2019-08-04 LAB — POCT GLYCOSYLATED HEMOGLOBIN (HGB A1C)
HbA1c POC (<> result, manual entry): 5.8 % (ref 4.0–5.6)
HbA1c, POC (controlled diabetic range): 5.8 % (ref 0.0–7.0)
HbA1c, POC (prediabetic range): 5.8 % (ref 5.7–6.4)
Hemoglobin A1C: 5.8 % — AB (ref 4.0–5.6)

## 2019-08-04 MED ORDER — LOSARTAN POTASSIUM-HCTZ 50-12.5 MG PO TABS: 1.0000 | ORAL_TABLET | Freq: Every day | ORAL | 3 refills | Status: DC

## 2019-08-04 MED FILL — LOSARTAN-HCTZ 50-12.5 MG TA: 50-12.5 | 90 days supply | Qty: 90 | Fill #0

## 2019-08-04 NOTE — Progress Notes (Signed)
Donald Wheeler, [place]  [address] Phone:  [phone]   Fax:  [phone]   Established Patient Office Visit  Subjective:  Patient ID: Donald Wheeler, male    DOB: 02-03-1971  Age: 49 y.o. MRN: [id number]  CC:  Chief Complaint  Patient presents with  . Follow-up    blurred vision     HPI Donald Wheeler presents for blurred vision. He  has a past medical history of Bell's palsy (09/07/2013), Diabetes mellitus without complication ([place]) (15/4008), HIV (human immunodeficiency virus infection) ([place]), Hypertension, and Stroke ([place]) (04/09/2017).   Hypertension Patient is here for follow-up of elevated blood pressure. He is walking and is adherent to a low-salt diet. Blood pressure is not monitored at home. Cardiac symptoms: irregular heart beat, lower extremity edema and headaches and visual changes. Patient denies chest pain, dyspnea, near-syncope and syncope. Cardiovascular risk factors: diabetes mellitus, dyslipidemia, hypertension, male gender and obesity (BMI >= 30 kg/m2). Use of agents associated with hypertension: none. History of target organ damage: angina/ prior myocardial infarction and stroke . He has not taken his medication today. However his BP was elevated at this last OV.   Diabetes Mellitus Patient presents for follow up of diabetes. Current symptoms include: visual disturbances. Symptoms have progressed to a point and plateaued. Patient denies foot ulcerations, hyperglycemia, hypoglycemia , increased appetite, nausea, polydipsia, polyuria, vomiting and weight loss. Evaluation to date has included: fasting blood sugar, fasting lipid panel, hemoglobin A1C and microalbuminuria.  Home sugars: BGs consistently in an acceptable range. Current treatment: Continued insulin which has been effective and Continued metformin which has been effective. Last dilated eye exam: unknown.   Past Medical History:  Diagnosis Date  . Bell's palsy 09/07/2013   . Diabetes mellitus without complication ([place]) 67/6195   NEW ONSET   . HIV (human immunodeficiency virus infection) ([place])    dx'd 2000s  . Hypertension   . Stroke ([place]) 04/09/2017   vs TIA w/acute onset of right-sided weakness as well as trouble speaking/notes 04/09/2017    Past Surgical History:  Procedure Laterality Date  . NO PAST SURGERIES      Family History  Problem Relation Age of Onset  . Hypertension Mother   . Diabetes Mother     Social History   Socioeconomic History  . Marital status: Divorced    Spouse name: Not on file  . Number of children: Not on file  . Years of education: Not on file  . Highest education level: Not on file  Occupational History  . Not on file  Tobacco Use  . Smoking status: Current Every Day Smoker    Packs/day: 0.20    Years: 15.00    Pack years: 3.00    Types: Cigars    Last attempt to quit: 12/12/2017    Years since quitting: 1.6  . Smokeless tobacco: Never Used  . Tobacco comment: black and mildsl daily  Substance and Sexual Activity  . Alcohol use: Yes    Comment: 04/09/2017 "might have 2 beers/month; if that"  . Drug use: Yes    Types: Marijuana  . Sexual activity: Not Currently    Partners: Female    Birth control/protection: None, Condom    Comment: patient given condoms  Other Topics Concern  . Not on file  Social History Narrative  . Not on file   Social Determinants of Health   Financial Resource Strain:   . Difficulty of Paying Living Expenses:  Food Insecurity:   . Worried About Charity fundraiser in the Last Year:   . Arboriculturist in the Last Year:   Transportation Needs:   . Film/video editor (Medical):   Marland Kitchen Lack of Transportation (Non-Medical):   Physical Activity:   . Days of Exercise per Week:   . Minutes of Exercise per Session:   Stress:   . Feeling of Stress :   Social Connections:   . Frequency of Communication with Friends and Family:   . Frequency of Social Gatherings with Friends  and Family:   . Attends Religious Services:   . Active Member of Clubs or Organizations:   . Attends Archivist Meetings:   Marland Kitchen Marital Status:   Intimate Partner Violence:   . Fear of Current or Ex-Partner:   . Emotionally Abused:   Marland Kitchen Physically Abused:   . Sexually Abused:     Outpatient Medications Prior to Visit  Medication Sig Dispense Refill  . amLODipine (NORVASC) 10 MG tablet TAKE 1 TABLET(10 MG) BY MOUTH DAILY.Needs Apt for more refills! 30 tablet 0  . aspirin EC 81 MG tablet Take 1 tablet (81 mg total) by mouth daily. 100 tablet 3  . atorvastatin (LIPITOR) 80 MG tablet NEEDS APPT FOR REFILLS 30 tablet 1  . BIKTARVY 50-200-25 MG TABS tablet TAKE 1 TABLET BY MOUTH DAILY 30 tablet 4  . blood glucose meter kit and supplies KIT Dispense based on patient and insurance preference. Use up to four times daily as directed. (FOR ICD-9 250.00, 250.01). 1 each 0  . Insulin Pen Needle (PEN NEEDLES) 31G X 8 MM MISC 1 each by Does not apply route as directed. 100 each 11  . metFORMIN (GLUCOPHAGE) 500 MG tablet Needs Apt for more refills! 60 tablet 0  . ergocalciferol (VITAMIN D2) 1.25 MG (50000 UT) capsule Take 1 capsule (50,000 Units total) by mouth once a week. X 12 weeks. (Patient not taking: Reported on 08/04/2019) 12 capsule 0  . insulin detemir (LEVEMIR FLEXTOUCH) 100 UNIT/ML FlexPen ADMINISTER 25 UNITS UNDER THE SKIN DAILY 15 mL 3   No facility-administered medications prior to visit.    No Known Allergies  ROS Review of Systems  All other systems reviewed and are negative.     Objective:    Physical Exam  Constitutional: He is oriented to person, place, and time. He appears well-developed.  HENT:  Head: Normocephalic.  Cardiovascular: Normal rate, regular rhythm, normal heart sounds and intact distal pulses.  Pulmonary/Chest: Effort normal and breath sounds normal.  Abdominal: Soft. Bowel sounds are normal.  Musculoskeletal:        General: Normal range of  motion.  Neurological: He is alert and oriented to person, place, and time.  Skin: Skin is warm and dry.  Psychiatric: He has a normal mood and affect. His behavior is normal. Judgment and thought content normal.    BP (!) 168/100 (BP Location: Right Arm) Comment: pt said that he didn't take medication this morning  Pulse 75   Ht _0  (1.676 m)   Wt 193 lb (87.5 kg)   SpO2 100%   BMI 31.15 kg/m  Wt Readings from Last 3 Encounters:  08/04/19 193 lb (87.5 kg)  05/08/19 200 lb (90.7 kg)  02/11/19 198 lb (89.8 kg)     Health Maintenance Due  Topic Date Due  . OPHTHALMOLOGY EXAM  Never done  . COVID-19 Vaccine (1) Never done  . COLONOSCOPY  Never done  .  FOOT EXAM  02/04/2019    There are no preventive care reminders to display for this patient.  No results found for: TSH Lab Results  Component Value Date   WBC 6.9 05/08/2019   HGB 14.9 05/08/2019   HCT 45.4 05/08/2019   MCV 90 05/08/2019   PLT 196 05/08/2019   Lab Results  Component Value Date   NA 143 05/08/2019   K 4.5 05/08/2019   CO2 27 02/11/2019   GLUCOSE 61 (L) 05/08/2019   BUN 18 05/08/2019   CREATININE 1.40 (H) 05/08/2019   BILITOT 0.3 05/08/2019   ALKPHOS 77 05/08/2019   AST 18 05/08/2019   ALT 13 07/20/2018   PROT 6.4 05/08/2019   ALBUMIN 4.1 05/08/2019   CALCIUM 8.8 05/08/2019   ANIONGAP 7 02/11/2019   Lab Results  Component Value Date   CHOL 208 (H) 07/20/2018   Lab Results  Component Value Date   HDL 42 07/20/2018   Lab Results  Component Value Date   LDLCALC 132 (H) 07/20/2018   Lab Results  Component Value Date   TRIG 206 (H) 07/20/2018   Lab Results  Component Value Date   CHOLHDL 5.0 (H) 07/20/2018   Lab Results  Component Value Date   HGBA1C 5.8 (A) 08/04/2019   HGBA1C 5.8 08/04/2019   HGBA1C 5.8 08/04/2019   HGBA1C 5.8 08/04/2019      Assessment & Plan:   Problem List Items Addressed This Visit      Unprioritized   HTN (hypertension), benign   Relevant  Medications   losartan-hydrochlorothiazide (HYZAAR) 50-12.5 MG tablet   Other Relevant Orders   EKG 12-Lead    Other Visit Diagnoses    Uncontrolled type 2 diabetes mellitus with hyperglycemia ([place])    -  Primary   Relevant Medications   losartan-hydrochlorothiazide (HYZAAR) 50-12.5 MG tablet   Other Relevant Orders   POCT Urinalysis Dipstick (Completed)   HgB A1c (Completed)   Microalbumin/Creatinine Ratio, Urine   DM type 2, controlled, with complication ([facility])       Relevant Medications   losartan-hydrochlorothiazide (HYZAAR) 50-12.5 MG tablet      Meds ordered this encounter  Medications  . losartan-hydrochlorothiazide (HYZAAR) 50-12.5 MG tablet    Sig: Take 1 tablet by mouth daily.    Dispense:  90 tablet    Refill:  3    Order Specific Question:   Supervising Provider    Answer:   Tresa Garter [id number]    Follow-up: Return for please remind pt fasting labs at 6 weeks follow up. Thanks, 6 week follow up medication change.    Vevelyn Francois, NP

## 2019-08-04 NOTE — Patient Instructions (Signed)
Managing Your Hypertension Hypertension is commonly called high blood pressure. This is when the force of your blood pressing against the walls of your arteries is too strong. Arteries are blood vessels that carry blood from your heart throughout your body. Hypertension forces the heart to work harder to pump blood, and may cause the arteries to become narrow or stiff. Having untreated or uncontrolled hypertension can cause heart attack, stroke, kidney disease, and other problems. What are blood pressure readings? A blood pressure reading consists of a higher number over a lower number. Ideally, your blood pressure should be below 120/80. The first ("top") number is called the systolic pressure. It is a measure of the pressure in your arteries as your heart beats. The second ("bottom") number is called the diastolic pressure. It is a measure of the pressure in your arteries as the heart relaxes. What does my blood pressure reading mean? Blood pressure is classified into four stages. Based on your blood pressure reading, your health care provider may use the following stages to determine what type of treatment you need, if any. Systolic pressure and diastolic pressure are measured in a unit called mm Hg. Normal  Systolic pressure: below 120.  Diastolic pressure: below 80. Elevated  Systolic pressure: 120-129.  Diastolic pressure: below 80. Hypertension stage 1  Systolic pressure: 130-139.  Diastolic pressure: 80-89. Hypertension stage 2  Systolic pressure: 140 or above.  Diastolic pressure: 90 or above. What health risks are associated with hypertension? Managing your hypertension is an important responsibility. Uncontrolled hypertension can lead to:  A heart attack.  A stroke.  A weakened blood vessel (aneurysm).  Heart failure.  Kidney damage.  Eye damage.  Metabolic syndrome.  Memory and concentration problems. What changes can I make to manage my  hypertension? Hypertension can be managed by making lifestyle changes and possibly by taking medicines. Your health care provider will help you make a plan to bring your blood pressure within a normal range. Eating and drinking   Eat a diet that is high in fiber and potassium, and low in salt (sodium), added sugar, and fat. An example eating plan is called the DASH (Dietary Approaches to Stop Hypertension) diet. To eat this way: ? Eat plenty of fresh fruits and vegetables. Try to fill half of your plate at each meal with fruits and vegetables. ? Eat whole grains, such as whole wheat pasta, brown rice, or whole grain bread. Fill about one quarter of your plate with whole grains. ? Eat low-fat diary products. ? Avoid fatty cuts of meat, processed or cured meats, and poultry with skin. Fill about one quarter of your plate with lean proteins such as fish, chicken without skin, beans, eggs, and tofu. ? Avoid premade and processed foods. These tend to be higher in sodium, added sugar, and fat.  Reduce your daily sodium intake. Most people with hypertension should eat less than 1,500 mg of sodium a day.  Limit alcohol intake to no more than 1 drink a day for nonpregnant women and 2 drinks a day for men. One drink equals 12 oz of beer, 5 oz of wine, or 1 oz of hard liquor. Lifestyle  Work with your health care provider to maintain a healthy body weight, or to lose weight. Ask what an ideal weight is for you.  Get at least 30 minutes of exercise that causes your heart to beat faster (aerobic exercise) most days of the week. Activities may include walking, swimming, or biking.  Include exercise   to strengthen your muscles (resistance exercise), such as weight lifting, as part of your weekly exercise routine. Try to do these types of exercises for 30 minutes at least 3 days a week.  Do not use any products that contain nicotine or tobacco, such as cigarettes and e-cigarettes. If you need help quitting,  ask your health care provider.  Control any long-term (chronic) conditions you have, such as high cholesterol or diabetes. Monitoring  Monitor your blood pressure at home as told by your health care provider. Your personal target blood pressure may vary depending on your medical conditions, your age, and other factors.  Have your blood pressure checked regularly, as often as told by your health care provider. Working with your health care provider  Review all the medicines you take with your health care provider because there may be side effects or interactions.  Talk with your health care provider about your diet, exercise habits, and other lifestyle factors that may be contributing to hypertension.  Visit your health care provider regularly. Your health care provider can help you create and adjust your plan for managing hypertension. Will I need medicine to control my blood pressure? Your health care provider may prescribe medicine if lifestyle changes are not enough to get your blood pressure under control, and if:  Your systolic blood pressure is 130 or higher.  Your diastolic blood pressure is 80 or higher. Take medicines only as told by your health care provider. Follow the directions carefully. Blood pressure medicines must be taken as prescribed. The medicine does not work as well when you skip doses. Skipping doses also puts you at risk for problems. Contact a health care provider if:  You think you are having a reaction to medicines you have taken.  You have repeated (recurrent) headaches.  You feel dizzy.  You have swelling in your ankles.  You have trouble with your vision. Get help right away if:  You develop a severe headache or confusion.  You have unusual weakness or numbness, or you feel faint.  You have severe pain in your chest or abdomen.  You vomit repeatedly.  You have trouble breathing. Summary  Hypertension is when the force of blood pumping  through your arteries is too strong. If this condition is not controlled, it may put you at risk for serious complications.  Your personal target blood pressure may vary depending on your medical conditions, your age, and other factors. For most people, a normal blood pressure is less than 120/80.  Hypertension is managed by lifestyle changes, medicines, or both. Lifestyle changes include weight loss, eating a healthy, low-sodium diet, exercising more, and limiting alcohol. This information is not intended to replace advice given to you by your health care provider. Make sure you discuss any questions you have with your health care provider. Document Revised: 06/24/2018 Document Reviewed: 01/29/2016 Elsevier Patient Education  [employer]. Sinus Tachycardia  Sinus tachycardia is a kind of fast heartbeat. In sinus tachycardia, the heart beats more than 100 times a minute. Sinus tachycardia starts in a part of the heart called the sinus node. Sinus tachycardia may be harmless, or it may be a sign of a serious condition. What are the causes? This condition may be caused by:  Exercise or exertion.  A fever.  Pain.  Loss of body fluids (dehydration).  Severe bleeding (hemorrhage).  Anxiety and stress.  Certain substances, including: ? Alcohol. ? Caffeine. ? Tobacco and nicotine products. ? Cold medicines. ? Illegal drugs.  Medical conditions including: ? Heart disease. ? An infection. ? An overactive thyroid (hyperthyroidism). ? A lack of red blood cells (anemia). What are the signs or symptoms? Symptoms of this condition include:  A feeling that the heart is beating quickly (palpitations).  Suddenly noticing your heartbeat (cardiac awareness).  Dizziness.  Tiredness (fatigue).  Shortness of breath.  Chest pain.  Nausea.  Fainting. How is this diagnosed? This condition is diagnosed with:  A physical exam.  Other tests, such as: ? Blood tests. ? An  electrocardiogram (ECG). This test measures the electrical activity of the heart. ? Ambulatory cardiac monitor. This records your heartbeats for 24 hours or more. You may be referred to a heart specialist (cardiologist). How is this treated? Treatment for this condition depends on the cause or the underlying condition. Treatment may involve:  Treating the underlying condition.  Taking new medicines or changing your current medicines as told by your health care provider.  Making changes to your diet or lifestyle. Follow these instructions at home: Lifestyle   Do not use any products that contain nicotine or tobacco, such as cigarettes and e-cigarettes. If you need help quitting, ask your health care provider.  Do not use illegal drugs, such as cocaine.  Learn relaxation methods to help you when you get stressed or anxious. These include deep breathing.  Avoid caffeine or other stimulants. Alcohol use   Do not drink alcohol if: ? Your health care provider tells you not to drink. ? You are pregnant, may be pregnant, or are planning to become pregnant.  If you drink alcohol, limit how much you have: ? 0-1 drink a day for women. ? 0-2 drinks a day for men.  Be aware of how much alcohol is in your drink. In the [place] one drink equals one typical bottle of beer (12 oz), one-half glass of wine (5 oz), or one shot of hard liquor (1 oz). General instructions  Drink enough fluids to keep your urine pale yellow.  Take over-the-counter and prescription medicines only as told by your health care provider.  Keep all follow-up visits as told by your health care provider. This is important. Contact a health care provider if you have:  A fever.  Vomiting or diarrhea that does not go away. Get help right away if you:  Have pain in your chest, upper arms, jaw, or neck.  Become weak or dizzy.  Feel faint.  Have palpitations that do not go away. Summary  In sinus tachycardia, the  heart beats more than 100 times a minute.  Sinus tachycardia may be harmless, or it may be a sign of a serious condition.  Treatment for this condition depends on the cause or the underlying condition.  Get help right away if you have pain in your chest, upper arms, jaw, or neck. This information is not intended to replace advice given to you by your health care provider. Make sure you discuss any questions you have with your health care provider. Document Revised: 04/21/2017 Document Reviewed: 04/21/2017 Elsevier Patient Education  2020 [employer].

## 2019-08-05 LAB — MICROALBUMIN / CREATININE URINE RATIO
Creatinine, Urine: 115.4 mg/dL
Microalb/Creat Ratio: 1089 mg/g creat — ABNORMAL HIGH (ref 0–29)
Microalbumin, Urine: 1256.7 ug/mL

## 2019-08-08 ENCOUNTER — Other Ambulatory Visit: Payer: Self-pay

## 2019-08-08 ENCOUNTER — Telehealth: Payer: Self-pay | Admitting: Clinical

## 2019-08-08 DIAGNOSIS — B20 Human immunodeficiency virus [HIV] disease: Secondary | ICD-10-CM

## 2019-08-08 DIAGNOSIS — Z79899 Other long term (current) drug therapy: Secondary | ICD-10-CM

## 2019-08-08 DIAGNOSIS — Z113 Encounter for screening for infections with a predominantly sexual mode of transmission: Secondary | ICD-10-CM

## 2019-08-08 NOTE — Telephone Encounter (Signed)
[facility]ated [facility]al [facility] Case Management Referral Note  08/08/2019 Name: Donald Wheeler MRN: [id number] DOB: 06/05/1970 Donald Wheeler is a 49 y.o. year old male who sees Vevelyn Francois, NP for primary care. LCSW was consulted to assess patient's needs and assist the patient with [employer]  and Financial Difficulties related to low income and lack of health coverage.  Assessment: Patient experiencing Financial constraints related to no income and lack of health coverage. Patient also in need of an eye exam. Called patient to follow up on referral for these needs. Assessed eligibility for [facility] [place] for a free eye exam. Patient is eligible for the program. He will come to the office to sign application and CSW will submit. Patient reports that he does not have income and has not been able to work due to health conditions. He is living with a family member. Also providing patient with Medicaid application and information on how to apply for social security disability. Advised patient to follow up with CSW for assistance on these applications.   Review of patient status, including review of consultants reports, relevant laboratory and other test results, and collaboration with appropriate care team members and the patient's provider was performed as part of comprehensive patient evaluation and provision of services.    SDOH (Social Determinants of Health) assessments performed: No  Outpatient Encounter Medications as of 08/08/2019  Medication Sig Note  . amLODipine (NORVASC) 10 MG tablet TAKE 1 TABLET(10 MG) BY MOUTH DAILY.Needs Apt for more refills!   Marland Kitchen aspirin EC 81 MG tablet Take 1 tablet (81 mg total) by mouth daily.   Marland Kitchen atorvastatin (LIPITOR) 80 MG tablet NEEDS APPT FOR REFILLS   . BIKTARVY 50-200-25 MG TABS tablet TAKE 1 TABLET BY MOUTH DAILY   . blood glucose meter kit and supplies KIT Dispense based on patient and insurance preference. Use up to four times daily as directed. (FOR ICD-9  250.00, 250.01).   Marland Kitchen ergocalciferol (VITAMIN D2) 1.25 MG (50000 UT) capsule Take 1 capsule (50,000 Units total) by mouth once a week. X 12 weeks. (Patient not taking: Reported on 08/04/2019)   . insulin detemir (LEVEMIR FLEXTOUCH) 100 UNIT/ML FlexPen ADMINISTER 25 UNITS UNDER THE SKIN DAILY 08/04/2019: Pt is taking 26 units   . Insulin Pen Needle (PEN NEEDLES) 31G X 8 MM MISC 1 each by Does not apply route as directed.   Marland Kitchen losartan-hydrochlorothiazide (HYZAAR) 50-12.5 MG tablet Take 1 tablet by mouth daily.   . metFORMIN (GLUCOPHAGE) 500 MG tablet Needs Apt for more refills!    No facility-administered encounter medications on file as of 08/08/2019.    Goals Addressed   None      Follow up Plan: 1. Patient to sign [facility] [place] application and CSW will submit and follow up per approval or denial. 2. Providing patient with applications/information on Medicaid and disability. Will be available from the clinic as needed to assist with these applications.  Donald Wheeler, [facility] [phone]

## 2019-08-09 ENCOUNTER — Encounter: Payer: Self-pay | Admitting: Nurse Practitioner

## 2019-08-09 DIAGNOSIS — Z794 Long term (current) use of insulin: Secondary | ICD-10-CM | POA: Insufficient documentation

## 2019-08-09 DIAGNOSIS — E1129 Type 2 diabetes mellitus with other diabetic kidney complication: Secondary | ICD-10-CM | POA: Insufficient documentation

## 2019-08-09 LAB — T-HELPER CELL (CD4) - (RCID CLINIC ONLY)
CD4 % Helper T Cell: 18 % — ABNORMAL LOW (ref 33–65)
CD4 T Cell Abs: 466 /uL (ref 400–1790)

## 2019-08-09 LAB — URINE CYTOLOGY ANCILLARY ONLY
Chlamydia: NEGATIVE
Comment: NEGATIVE
Comment: NORMAL
Neisseria Gonorrhea: NEGATIVE

## 2019-08-10 LAB — CBC WITH DIFFERENTIAL/PLATELET
Absolute Monocytes: 611 cells/uL (ref 200–950)
Basophils Absolute: 13 cells/uL (ref 0–200)
Basophils Relative: 0.2 %
Eosinophils Absolute: 189 cells/uL (ref 15–500)
Eosinophils Relative: 2.9 %
HCT: 47.1 % (ref 38.5–50.0)
Hemoglobin: 15.9 g/dL (ref 13.2–17.1)
Lymphs Abs: 2652 cells/uL (ref 850–3900)
MCH: 29.8 pg (ref 27.0–33.0)
MCHC: 33.8 g/dL (ref 32.0–36.0)
MCV: 88.4 fL (ref 80.0–100.0)
MPV: 11.5 fL (ref 7.5–12.5)
Monocytes Relative: 9.4 %
Neutro Abs: 3036 cells/uL (ref 1500–7800)
Neutrophils Relative %: 46.7 %
Platelets: 183 10*3/uL (ref 140–400)
RBC: 5.33 10*6/uL (ref 4.20–5.80)
RDW: 13.9 % (ref 11.0–15.0)
Total Lymphocyte: 40.8 %
WBC: 6.5 10*3/uL (ref 3.8–10.8)

## 2019-08-10 LAB — COMPREHENSIVE METABOLIC PANEL
AG Ratio: 1.5 (calc) (ref 1.0–2.5)
ALT: 18 U/L (ref 9–46)
AST: 19 U/L (ref 10–35)
Albumin: 4 g/dL (ref 3.6–5.1)
Alkaline phosphatase (APISO): 76 U/L (ref 35–144)
BUN/Creatinine Ratio: 12 (calc) (ref 6–22)
BUN: 18 mg/dL (ref 7–25)
CO2: 30 mmol/L (ref 20–32)
Calcium: 8.8 mg/dL (ref 8.6–10.3)
Chloride: 104 mmol/L (ref 98–110)
Creat: 1.46 mg/dL — ABNORMAL HIGH (ref 0.70–1.33)
Globulin: 2.6 g/dL (calc) (ref 1.9–3.7)
Glucose, Bld: 103 mg/dL — ABNORMAL HIGH (ref 65–99)
Potassium: 4.3 mmol/L (ref 3.5–5.3)
Sodium: 141 mmol/L (ref 135–146)
Total Bilirubin: 0.5 mg/dL (ref 0.2–1.2)
Total Protein: 6.6 g/dL (ref 6.1–8.1)

## 2019-08-10 LAB — RPR: RPR Ser Ql: NONREACTIVE

## 2019-08-10 LAB — LIPID PANEL
Cholesterol: 146 mg/dL (ref <200)
HDL: 37 mg/dL — ABNORMAL LOW (ref >=40)
LDL Cholesterol (Calc): 83 mg/dL (calc)
Non-HDL Cholesterol (Calc): 109 mg/dL (calc) (ref <130)
Total CHOL/HDL Ratio: 3.9 (calc) (ref <5.0)
Triglycerides: 159 mg/dL — ABNORMAL HIGH (ref <150)

## 2019-08-10 LAB — HIV-1 RNA QUANT-NO REFLEX-BLD
HIV 1 RNA Quant: 33 copies/mL — ABNORMAL HIGH
HIV-1 RNA Quant, Log: 1.52 Log copies/mL — ABNORMAL HIGH

## 2019-08-14 ENCOUNTER — Other Ambulatory Visit: Payer: Self-pay

## 2019-08-14 ENCOUNTER — Encounter (HOSPITAL_COMMUNITY): Payer: Self-pay

## 2019-08-14 ENCOUNTER — Emergency Department (HOSPITAL_COMMUNITY)
Admission: EM | Admit: 2019-08-14 | Discharge: 2019-08-14 | Disposition: A | Discharge status: Home / Self Care | Payer: Self-pay | Attending: Emergency Medicine | Admitting: Emergency Medicine

## 2019-08-14 ENCOUNTER — Emergency Department (HOSPITAL_COMMUNITY): Payer: Self-pay

## 2019-08-14 DIAGNOSIS — E119 Type 2 diabetes mellitus without complications: Secondary | ICD-10-CM | POA: Insufficient documentation

## 2019-08-14 DIAGNOSIS — M25562 Pain in left knee: Secondary | ICD-10-CM

## 2019-08-14 DIAGNOSIS — Z21 Asymptomatic human immunodeficiency virus [HIV] infection status: Secondary | ICD-10-CM | POA: Insufficient documentation

## 2019-08-14 DIAGNOSIS — I10 Essential (primary) hypertension: Secondary | ICD-10-CM | POA: Insufficient documentation

## 2019-08-14 DIAGNOSIS — F17210 Nicotine dependence, cigarettes, uncomplicated: Secondary | ICD-10-CM | POA: Insufficient documentation

## 2019-08-14 NOTE — ED Triage Notes (Signed)
Pt c/o left knee pain since Fri afternoon. Pt sts the pain started at work, so he left early. On Sat the knee was swollen. Denies any injury.

## 2019-08-14 NOTE — ED Provider Notes (Signed)
[facility]-EMER[facility]ENCY DEPT [facility] Emergency Department Provider Note MRN:  [id number]  Arrival date & time: 08/14/19     Chief Complaint   Knee Pain (Left)   History of Present Illness   Donald Wheeler is a 49 y.o. year-old male with a history of diabetes, HIV, stroke presenting to the ED with chief complaint of knee pain.  Location: Left knee Duration: 2 or 3 days Onset: Gradual Timing: Constant Description: Dull ache Severity: Moderate Exacerbating/Alleviating Factors: Worse with motion Associated Symptoms: None Pertinent Negatives: Denies fever, no cough, no trauma, no chest pain or shortness of breath, no abdominal pain, no other complaints.   Review of Systems  A complete 10 system review of systems was obtained and all systems are negative except as noted in the HPI and PMH.   Patient's Health History    Past Medical History:  Diagnosis Date  . Bell's palsy 09/07/2013  . Diabetes mellitus without complication (HCC) 12/2017   NEW ONSET   . HIV (human immunodeficiency virus infection) (HCC)    dx'd 2000s  . Hypertension   . Stroke (HCC) 04/09/2017   vs TIA w/acute onset of right-sided weakness as well as trouble speaking/notes 04/09/2017    Past Surgical History:  Procedure Laterality Date  . HEMORRHOID SURGERY      Family History  Problem Relation Age of Onset  . Hypertension Mother   . Diabetes Mother     Social History   Socioeconomic History  . Marital status: Divorced    Spouse name: Not on file  . Number of children: Not on file  . Years of education: Not on file  . Highest education level: Not on file  Occupational History  . Not on file  Tobacco Use  . Smoking status: Current Every Day Smoker    Packs/day: 0.20    Years: 15.00    Pack years: 3.00    Types: Cigars    Last attempt to quit: 12/12/2017    Years since quitting: 1.6  . Smokeless tobacco: Never Used  . Tobacco comment: black and mildsl daily  Substance and Sexual Activity  .  Alcohol use: Yes    Comment: 04/09/2017 "might have 2 beers/month; if that"  . Drug use: Yes    Types: Marijuana  . Sexual activity: Not Currently    Partners: Female    Birth control/protection: None, Condom    Comment: patient given condoms  Other Topics Concern  . Not on file  Social History Narrative  . Not on file   Social Determinants of Health   Financial Resource Strain:   . Difficulty of Paying Living Expenses:   Food Insecurity:   . Worried About Programme researcher, broadcasting/film/video in the Last Year:   . Barista in the Last Year:   Transportation Needs:   . Freight forwarder (Medical):   Marland Kitchen Lack of Transportation (Non-Medical):   Physical Activity:   . Days of Exercise per Week:   . Minutes of Exercise per Session:   Stress:   . Feeling of Stress :   Social Connections:   . Frequency of Communication with Friends and Family:   . Frequency of Social Gatherings with Friends and Family:   . Attends Religious Services:   . Active Member of Clubs or Organizations:   . Attends Banker Meetings:   Marland Kitchen Marital Status:   Intimate Partner Violence:   . Fear of Current or Ex-Partner:   . Emotionally Abused:   .  Physically Abused:   . Sexually Abused:      Physical Exam   Vitals:   08/14/19 1058  BP: (!) 182/105  Pulse: 94  Resp: 19  Temp: 98.8 F (37.1 C)  SpO2: 98%    CONSTITUTIONAL: Well-appearing, NAD NEURO:  Alert and oriented x 3, no focal deficits EYES:  eyes equal and reactive ENT/NECK:  no LAD, no JVD CARDIO: Regular rate, well-perfused, normal S1 and S2 PULM:  CTAB no wheezing or rhonchi GI/GU:  normal bowel sounds, non-distended, non-tender MSK/SPINE:  No gross deformities, no edema; tenderness to palpation to the medial aspect of the left knee, slightly limited but largely preserved range of motion SKIN:  no rash, atraumatic PSYCH:  Appropriate speech and behavior  *Additional and/or pertinent findings included in MDM below  Diagnostic  and Interventional Summary    EKG Interpretation  Date/Time:    Ventricular Rate:    PR Interval:    QRS Duration:   QT Interval:    QTC Calculation:   R Axis:     Text Interpretation:        Labs Reviewed - No data to display  DG Knee Complete 4 Views Left  Final Result      Medications - No data to display   Procedures  /  Critical Care Procedures  ED Course and Medical Decision Making  I have reviewed the triage vital signs, the nursing notes, and pertinent available records from the EMR.  Listed above are laboratory and imaging tests that I personally ordered, reviewed, and interpreted and then considered in my medical decision making (see below for details).      No increased warmth, no erythema, no significant swelling, largely preserved range of motion, nothing to suggest septic joint.  Vital signs reassuring, neurovascularly intact, suspect meniscal strain or tear or flare of arthritis.  Patient thinks the concrete at the worksite "flared things up".  X-ray reassuring, appropriate for discharge.    [name]. [name], [name] [email or web address]  Final Clinical Impressions(s) / ED Diagnoses     ICD-10-CM   1. Acute pain of left knee  M25.562     ED Discharge Orders    None       Discharge Instructions Discussed with and Provided to Patient:     Discharge Instructions     You were evaluated in the Emergency Department and after careful evaluation, we did not find any emergent condition requiring admission or further testing in the hospital.  Your exam/testing today was overall reassuring.  Your x-ray was normal.  We suspect you have some inflammation due to arthritis or mild injury to your meniscus.  Please rest the knee for the next few days and use Tylenol for pain.  Please return to the Emergency Department if you experience any worsening of your condition.  We encourage you to follow up with a  primary care provider.  Thank you for allowing [place] to be a part of your care.        Maudie Flakes, MD 08/14/19 [phone]

## 2019-08-14 NOTE — Discharge Instructions (Signed)
You were evaluated in the Emergency Department and after careful evaluation, we did not find any emergent condition requiring admission or further testing in the hospital.  Your exam/testing today was overall reassuring.  Your x-ray was normal.  We suspect you have some inflammation due to arthritis or mild injury to your meniscus.  Please rest the knee for the next few days and use Tylenol for pain.  Please return to the Emergency Department if you experience any worsening of your condition.  We encourage you to follow up with a primary care provider.  Thank you for allowing [place] to be a part of your care.

## 2019-08-16 ENCOUNTER — Other Ambulatory Visit: Payer: Self-pay | Admitting: Infectious Diseases

## 2019-08-17 NOTE — Progress Notes (Signed)
Called spoke w/ patient he said that did have appointment the kidney specialist yesterday. Will fax the results to kidney specialist.

## 2019-08-18 ENCOUNTER — Other Ambulatory Visit: Payer: Self-pay | Admitting: Infectious Diseases

## 2019-08-21 ENCOUNTER — Telehealth: Payer: Self-pay | Admitting: Clinical

## 2019-08-21 NOTE — Telephone Encounter (Signed)
[facility] General Follow Up Note  08/21/2019 Name: Donald Wheeler MRN: [id number] DOB: 20-Aug-1970 [name] is a 49 y.o. year old male who sees Barbette Merino, NP for primary care. LCSW was initially consulted to [employer]  and Financial Difficulties related to low income and lack of health coverage  Interpreter: No.   Interpreter Name & Language: none  Ongoing Intervention: Patient experiencing Financial constraints related to no income and lack of health coverage. Today CSW received approval through [employer] [facility] for patient to receive free eye exam. Called patient and informed him of the approval. Will mail eye doctor and scheduling information to patient.  Review of patient status, including review of consultants reports, relevant laboratory and other test results, and collaboration with appropriate care team members and the patient's provider was performed as part of comprehensive patient evaluation and provision of services.     Follow up Plan: 1. CSW available from clinic as needed  Abigail Butts, LCSW [facility] [phone]

## 2019-08-23 ENCOUNTER — Ambulatory Visit (INDEPENDENT_AMBULATORY_CARE_PROVIDER_SITE_OTHER): Payer: Self-pay | Admitting: Internal Medicine

## 2019-08-23 ENCOUNTER — Other Ambulatory Visit: Payer: Self-pay

## 2019-08-23 VITALS — BP 170/98 | HR 64 | Temp 98.2°F | Wt 194.0 lb

## 2019-08-23 DIAGNOSIS — N183 Chronic kidney disease, stage 3 unspecified: Secondary | ICD-10-CM

## 2019-08-23 DIAGNOSIS — B20 Human immunodeficiency virus [HIV] disease: Secondary | ICD-10-CM

## 2019-08-23 DIAGNOSIS — E118 Type 2 diabetes mellitus with unspecified complications: Secondary | ICD-10-CM

## 2019-08-23 DIAGNOSIS — F320 Major depressive disorder, single episode, mild: Secondary | ICD-10-CM

## 2019-08-23 NOTE — Progress Notes (Signed)
[facility]V: follow up for hiv disease  Patient ID: Donald Wheeler, male   DOB: 1970-04-16, 49 y.o.   MRN: [id number]  HPI CD 4 count 466/VL 31, on biktarvy, with good adherence. He reports having depressed mood since losing   his job last week; 3rd shift and packing. Physically taxing.now he has financial stressor with Unable to afford pen needles, and glucometer   Outpatient Encounter Medications as of 08/23/2019  Medication Sig  . amLODipine (NORVASC) 10 MG tablet TAKE 1 TABLET(10 MG) BY MOUTH DAILY.Needs Apt for more refills!  Marland Kitchen aspirin EC 81 MG tablet Take 1 tablet (81 mg total) by mouth daily.  Marland Kitchen atorvastatin (LIPITOR) 80 MG tablet NEEDS APPT FOR REFILLS  . BIKTARVY 50-200-25 MG TABS tablet TAKE 1 TABLET BY MOUTH DAILY  . blood glucose meter kit and supplies KIT Dispense based on patient and insurance preference. Use up to four times daily as directed. (FOR ICD-9 250.00, 250.01).  . insulin detemir (LEVEMIR FLEXTOUCH) 100 UNIT/ML FlexPen ADMINISTER 25 UNITS UNDER THE SKIN DAILY  . Insulin Pen Needle (PEN NEEDLES) 31G X 8 MM MISC 1 each by Does not apply route as directed.  . metFORMIN (GLUCOPHAGE) 500 MG tablet Needs Apt for more refills!  Marland Kitchen losartan-hydrochlorothiazide (HYZAAR) 50-12.5 MG tablet Take 1 tablet by mouth daily. (Patient not taking: Reported on 08/23/2019)   No facility-administered encounter medications on file as of 08/23/2019.     Patient Active Problem List   Diagnosis Date Noted  . Controlled type 2 diabetes mellitus with microalbuminuria, with long-term current use of insulin ([place]) 08/09/2019  . Vitamin D deficiency 05/10/2019  . Shingles 07/2018  . Hyperosmolar non-ketotic state in patient with type 2 diabetes mellitus ([place]) 12/23/2017  . Diabetes mellitus, new onset ([place]) 12/23/2017  . History of hematuria 08/16/2017  . Mixed hyperlipidemia 06/16/2017  . 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 Solitary pulmonary nodule 04/10/2017  . Focal  neurological deficit 04/09/2017  . CKD (chronic kidney disease) stage 3, GFR 30-59 ml/min 04/09/2017  . Smoker 04/09/2017  . HTN (hypertension), benign 09/07/2013  . HIV (human immunodeficiency virus infection) ([place]) 03/17/2012     Health Maintenance Due  Topic Date Due  . OPHTHALMOLOGY EXAM  Never done  . COLONOSCOPY  Never done  . FOOT EXAM  02/04/2019     Review of Systems 12 point ros negative except for depression- situational. No thought to harm to self Physical Exam   BP (!) 170/98 Comment: Hasn't taken medication yet  Pulse 64   Temp 98.2 F (36.8 C)   Wt 194 lb (88 kg)   SpO2 97%   BMI 31.31 kg/m   Physical Exam  Constitutional: He is oriented to person, place, and time. He appears well-developed and well-nourished. No distress.  HENT:  Mouth/Throat: Oropharynx is clear and moist. No oropharyngeal exudate.  Cardiovascular: Normal rate, regular rhythm and normal heart sounds. Exam reveals no gallop and no friction rub.  No murmur heard.  Pulmonary/Chest: Effort normal and breath sounds normal. No respiratory distress. He has no wheezes.  Abdominal: Soft. Bowel sounds are normal. He exhibits no distension. There is no tenderness.  Lymphadenopathy:  He has no cervical adenopathy.  Neurological: He is alert and oriented to person, place, and time.  Skin: Skin is warm and dry. No rash noted. No erythema.  Psychiatric: He has a normal mood and affect. His behavior is normal.    Lab Results  Component Value Date   CD4TCELL 18 (L) 08/08/2019  Lab Results  Component Value Date   CD4TABS 466 08/08/2019   CD4TABS 345 (L) 07/20/2018   CD4TABS 300 (L) 12/22/2017   Lab Results  Component Value Date   HIV1RNAQUANT 33 (H) 08/08/2019   Lab Results  Component Value Date   HEPBSAB NONREACTIVE 03/03/2012   Lab Results  Component Value Date   LABRPR NON-REACTIVE 08/08/2019    CBC Lab Results  Component Value Date   WBC 6.5 08/08/2019   RBC 5.33 08/08/2019    HGB 15.9 08/08/2019   HCT 47.1 08/08/2019   PLT 183 08/08/2019   MCV 88.4 08/08/2019   MCH 29.8 08/08/2019   MCHC 33.8 08/08/2019   RDW 13.9 08/08/2019   LYMPHSABS 2,652 08/08/2019   MONOABS 1.1 (H) 02/11/2019   EOSABS 189 08/08/2019    BMET Lab Results  Component Value Date   NA 141 08/08/2019   K 4.3 08/08/2019   CL 104 08/08/2019   CO2 30 08/08/2019   GLUCOSE 103 (H) 08/08/2019   BUN 18 08/08/2019   CREATININE 1.46 (H) 08/08/2019   CALCIUM 8.8 08/08/2019   GFRNONAA 58 (L) 05/08/2019   GFRAA 67 05/08/2019      Assessment and Plan  iddm = will do Community wellness referral. Sliding scale. Since he is running out of diabetes meds  Depression =Referral to counseling due to depressed mood/adjustment since loss of job  htn = takes meds at night usually, recommend to change to day time.  hiv disease = well controlled, continue on biktarvy

## 2019-09-08 ENCOUNTER — Encounter: Payer: Self-pay | Admitting: Clinical

## 2019-09-08 ENCOUNTER — Other Ambulatory Visit: Payer: Self-pay

## 2019-09-08 NOTE — Progress Notes (Signed)
[facility] General Follow Up Note  09/08/2019 Name: Donald Wheeler MRN: [id number] DOB: 11-Oct-1970 Donald Wheeler is a 49 y.o. year old male who sees Vevelyn Francois, NP for primary care. LCSW was initially consulted to [employer]  and Financial Difficulties related to low income and lack of health coverage  Interpreter: No.   Interpreter Name & Language: none  Ongoing Intervention: Patient experiencing Financial constraints related to no income and lack of health coverage. Patient had previously dropped off his eyeglasses prescription to CSW. Met with patient today and applied for voucher for free eye glasses with [employer]. Will follow up with patient when voucher approved to order glasses. Patient in the process of applying for Medicaid and disability. Advised patient to contact CSW for assistance with these applications as needed.  Review of patient status, including review of consultants reports, relevant laboratory and other test results, and collaboration with appropriate care team members and the patient's provider was performed as part of comprehensive patient evaluation and provision of services.     Follow up Plan: 1. CSW available from clinic as needed  [name], [place] [phone]

## 2019-09-14 ENCOUNTER — Other Ambulatory Visit: Payer: Self-pay | Admitting: Nurse Practitioner

## 2019-09-14 ENCOUNTER — Other Ambulatory Visit: Payer: Self-pay | Admitting: Infectious Diseases

## 2019-09-14 DIAGNOSIS — E119 Type 2 diabetes mellitus without complications: Secondary | ICD-10-CM

## 2019-09-15 ENCOUNTER — Other Ambulatory Visit: Payer: Self-pay | Admitting: Family Medicine

## 2019-09-15 ENCOUNTER — Telehealth: Payer: Self-pay | Admitting: Nurse Practitioner

## 2019-09-15 ENCOUNTER — Encounter: Payer: Self-pay | Admitting: Clinical

## 2019-09-15 NOTE — Progress Notes (Signed)
[facility] General Follow Up Note  09/15/2019 Name: Richard Ritchey MRN: [id number] DOB: 05/29/1970 Clayton Bosserman is a 49 y.o. year old male who sees Vevelyn Francois, NP for primary care. LCSW was initially consulted to assist patient with [employer] and Financial Difficulties related to low income and lack of health coverage  Interpreter: No.   Interpreter Name & Language: none  Ongoing Intervention: Patient experiencing Financial constraints related tono income and lack of health coverage. Patient in need of eyeglasses. He was approved through [employer] for a voucher for free eyeglasses. Today CSW and patient met at clinic to order patient's glasses.   Review of patient status, including review of consultants reports, relevant laboratory and other test results, and collaboration with appropriate care team members and the patient's provider was performed as part of comprehensive patient evaluation and provision of services.     Follow up Plan: 1. CSW available from clinic as needed  [name], [place] [phone]

## 2019-09-15 NOTE — Telephone Encounter (Signed)
[facility]

## 2019-09-20 ENCOUNTER — Other Ambulatory Visit: Payer: Self-pay | Admitting: Nurse Practitioner

## 2019-09-20 MED ORDER — LEVEMIR FLEXTOUCH 100 UNIT/ML ~~LOC~~ SOPN: PEN_INJECTOR | SUBCUTANEOUS | 3 refills | Status: DC

## 2019-09-20 MED FILL — !LEVEMIR FLEXPEN 100UNITS/M: 100U/ML (3) | 24 days supply | Qty: 6 | Fill #0

## 2019-09-20 NOTE — Telephone Encounter (Signed)
Sent!

## 2019-10-09 ENCOUNTER — Other Ambulatory Visit: Payer: Self-pay

## 2019-10-09 DIAGNOSIS — I10 Essential (primary) hypertension: Secondary | ICD-10-CM

## 2019-10-09 MED ORDER — AMLODIPINE BESYLATE 10 MG PO TABS: ORAL_TABLET | ORAL | 0 refills | Status: DC

## 2019-11-11 ENCOUNTER — Other Ambulatory Visit: Payer: Self-pay | Admitting: Nurse Practitioner

## 2019-11-11 DIAGNOSIS — E119 Type 2 diabetes mellitus without complications: Secondary | ICD-10-CM

## 2019-11-11 DIAGNOSIS — I10 Essential (primary) hypertension: Secondary | ICD-10-CM

## 2019-11-14 ENCOUNTER — Other Ambulatory Visit: Payer: Self-pay | Admitting: Nurse Practitioner

## 2019-11-16 ENCOUNTER — Other Ambulatory Visit: Payer: Self-pay

## 2019-11-16 DIAGNOSIS — B20 Human immunodeficiency virus [HIV] disease: Secondary | ICD-10-CM

## 2019-11-17 ENCOUNTER — Other Ambulatory Visit: Payer: Self-pay

## 2019-11-17 DIAGNOSIS — E119 Type 2 diabetes mellitus without complications: Secondary | ICD-10-CM

## 2019-11-17 DIAGNOSIS — I10 Essential (primary) hypertension: Secondary | ICD-10-CM

## 2019-11-17 MED ORDER — ATORVASTATIN CALCIUM 80 MG PO TABS: ORAL_TABLET | ORAL | 0 refills | Status: DC

## 2019-11-17 MED ORDER — AMLODIPINE BESYLATE 10 MG PO TABS: ORAL_TABLET | ORAL | 0 refills | Status: DC

## 2019-11-22 ENCOUNTER — Other Ambulatory Visit: Payer: Self-pay

## 2019-11-22 ENCOUNTER — Ambulatory Visit: Payer: Self-pay

## 2019-11-22 DIAGNOSIS — B20 Human immunodeficiency virus [HIV] disease: Secondary | ICD-10-CM

## 2019-11-23 LAB — T-HELPER CELL (CD4) - (RCID CLINIC ONLY)
CD4 % Helper T Cell: 20 % — ABNORMAL LOW (ref 33–65)
CD4 T Cell Abs: 420 /uL (ref 400–1790)

## 2019-11-24 LAB — CBC WITH DIFFERENTIAL/PLATELET
Absolute Monocytes: 507 cells/uL (ref 200–950)
Basophils Absolute: 20 cells/uL (ref 0–200)
Basophils Relative: 0.3 %
Eosinophils Absolute: 91 cells/uL (ref 15–500)
Eosinophils Relative: 1.4 %
HCT: 42.2 % (ref 38.5–50.0)
Hemoglobin: 14.1 g/dL (ref 13.2–17.1)
Lymphs Abs: 2288 cells/uL (ref 850–3900)
MCH: 29.7 pg (ref 27.0–33.0)
MCHC: 33.4 g/dL (ref 32.0–36.0)
MCV: 89 fL (ref 80.0–100.0)
MPV: 11.8 fL (ref 7.5–12.5)
Monocytes Relative: 7.8 %
Neutro Abs: 3595 cells/uL (ref 1500–7800)
Neutrophils Relative %: 55.3 %
Platelets: 182 10*3/uL (ref 140–400)
RBC: 4.74 10*6/uL (ref 4.20–5.80)
RDW: 13.9 % (ref 11.0–15.0)
Total Lymphocyte: 35.2 %
WBC: 6.5 10*3/uL (ref 3.8–10.8)

## 2019-11-24 LAB — COMPLETE METABOLIC PANEL WITH GFR
AG Ratio: 1.4 (calc) (ref 1.0–2.5)
ALT: 12 U/L (ref 9–46)
AST: 15 U/L (ref 10–35)
Albumin: 4.2 g/dL (ref 3.6–5.1)
Alkaline phosphatase (APISO): 69 U/L (ref 35–144)
BUN/Creatinine Ratio: 12 (calc) (ref 6–22)
BUN: 21 mg/dL (ref 7–25)
CO2: 26 mmol/L (ref 20–32)
Calcium: 9.1 mg/dL (ref 8.6–10.3)
Chloride: 107 mmol/L (ref 98–110)
Creat: 1.75 mg/dL — ABNORMAL HIGH (ref 0.70–1.33)
GFR, Est African American: 51 mL/min/{1.73_m2} — ABNORMAL LOW (ref >=60)
GFR, Est Non African American: 44 mL/min/{1.73_m2} — ABNORMAL LOW (ref >=60)
Globulin: 3 g/dL (calc) (ref 1.9–3.7)
Glucose, Bld: 223 mg/dL — ABNORMAL HIGH (ref 65–99)
Potassium: 4.1 mmol/L (ref 3.5–5.3)
Sodium: 140 mmol/L (ref 135–146)
Total Bilirubin: 0.6 mg/dL (ref 0.2–1.2)
Total Protein: 7.2 g/dL (ref 6.1–8.1)

## 2019-11-24 LAB — HIV-1 RNA QUANT-NO REFLEX-BLD
HIV 1 RNA Quant: <20 Copies/mL — ABNORMAL HIGH
HIV-1 RNA Quant, Log: <1.3 Log cps/mL — ABNORMAL HIGH

## 2019-11-28 ENCOUNTER — Encounter: Payer: Self-pay | Admitting: Internal Medicine

## 2019-12-06 ENCOUNTER — Encounter: Payer: Self-pay | Admitting: Internal Medicine

## 2019-12-18 ENCOUNTER — Other Ambulatory Visit: Payer: Self-pay | Admitting: Internal Medicine

## 2019-12-18 ENCOUNTER — Other Ambulatory Visit: Payer: Self-pay | Admitting: Nurse Practitioner

## 2019-12-18 ENCOUNTER — Other Ambulatory Visit: Payer: Self-pay

## 2019-12-18 DIAGNOSIS — E119 Type 2 diabetes mellitus without complications: Secondary | ICD-10-CM

## 2019-12-18 DIAGNOSIS — I10 Essential (primary) hypertension: Secondary | ICD-10-CM

## 2019-12-18 MED ORDER — AMLODIPINE BESYLATE 10 MG PO TABS: ORAL_TABLET | ORAL | 1 refills | Status: DC

## 2019-12-18 MED ORDER — ATORVASTATIN CALCIUM 80 MG PO TABS: ORAL_TABLET | ORAL | 1 refills | Status: DC

## 2020-01-01 ENCOUNTER — Encounter: Payer: Self-pay | Admitting: Internal Medicine

## 2020-01-01 ENCOUNTER — Other Ambulatory Visit: Payer: Self-pay

## 2020-01-01 ENCOUNTER — Other Ambulatory Visit: Payer: Self-pay | Admitting: Internal Medicine

## 2020-01-01 ENCOUNTER — Ambulatory Visit (INDEPENDENT_AMBULATORY_CARE_PROVIDER_SITE_OTHER): Payer: Self-pay | Admitting: Internal Medicine

## 2020-01-01 VITALS — BP 156/95 | HR 70 | Temp 98.0°F | Wt 205.0 lb

## 2020-01-01 DIAGNOSIS — B20 Human immunodeficiency virus [HIV] disease: Secondary | ICD-10-CM

## 2020-01-01 DIAGNOSIS — N183 Chronic kidney disease, stage 3 unspecified: Secondary | ICD-10-CM

## 2020-01-01 DIAGNOSIS — E118 Type 2 diabetes mellitus with unspecified complications: Secondary | ICD-10-CM

## 2020-01-01 DIAGNOSIS — I10 Essential (primary) hypertension: Secondary | ICD-10-CM

## 2020-01-01 MED ORDER — INSULIN PEN NEEDLE 32G X 4 MM MISC
1.0000 | 10 refills | Status: DC
  Filled 2020-10-09 – 2020-12-03 (×2): qty 100, 30d supply, fill #0

## 2020-01-01 NOTE — Progress Notes (Signed)
Patient ID: Donald Wheeler, male   DOB: 08/27/1970, 49 y.o.   MRN: [id number]  HPI 49yo M with hiv disease, CD 4 count 420/VL<20/on biktarvy. Takes medications daily. He is overall doing well. He states that he has not had covid exposure. No recent health issures  Soc hx: smokes about -- black and mal --   Outpatient Encounter Medications as of 01/01/2020  Medication Sig   amLODipine (NORVASC) 10 MG tablet TAKE 1 TABLET(10 MG) BY MOUTH DAILY.Needs Apt for more refills!   ASPIRIN LOW DOSE 81 MG EC tablet TAKE 1 TABLET(81 MG) BY MOUTH DAILY   atorvastatin (LIPITOR) 80 MG tablet Take one tab by mouth daily   BIKTARVY 50-200-25 MG TABS tablet TAKE 1 TABLET BY MOUTH DAILY   blood glucose meter kit and supplies KIT Dispense based on patient and insurance preference. Use up to four times daily as directed. (FOR ICD-9 250.00, 250.01).   Insulin Pen Needle (PEN NEEDLES) 31G X 8 MM MISC 1 each by Does not apply route as directed.   LEVEMIR FLEXTOUCH 100 UNIT/ML FlexPen ADMINISTER 25 UNITS UNDER THE SKIN DAILY   losartan-hydrochlorothiazide (HYZAAR) 50-12.5 MG tablet Take 1 tablet by mouth daily.   metFORMIN (GLUCOPHAGE) 500 MG tablet Needs Apt for more refills!   No facility-administered encounter medications on file as of 01/01/2020.     Patient Active Problem List   Diagnosis Date Noted   Controlled type 2 diabetes mellitus with microalbuminuria, with long-term current use of insulin ([place]) 08/09/2019   Vitamin D deficiency 05/10/2019   Shingles 07/2018   Hyperosmolar non-ketotic state in patient with type 2 diabetes mellitus ([place]) 12/23/2017   Diabetes mellitus, new onset ([place]) 12/23/2017   History of hematuria 08/16/2017   Mixed hyperlipidemia 06/16/2017   Cerebral thrombosis with cerebral infarction 04/10/2017   Solitary pulmonary nodule 04/10/2017   Focal neurological deficit 04/09/2017   CKD (chronic kidney disease) stage 3, GFR 30-59 ml/min 04/09/2017    Smoker 04/09/2017   HTN (hypertension), benign 09/07/2013   HIV (human immunodeficiency virus infection) ([place]) 03/17/2012     Health Maintenance Due  Topic Date Due   OPHTHALMOLOGY EXAM  Never done   COLONOSCOPY  Never done   FOOT EXAM  02/04/2019   INFLUENZA VACCINE  10/15/2019     Review of Systems  Physical Exam   BP (!) 156/95    Pulse 70    Temp 98 F (36.7 C) (Oral)    Wt 205 lb (93 kg)    BMI 33.09 kg/m    Lab Results  Component Value Date   CD4TCELL 20 (L) 11/22/2019   Lab Results  Component Value Date   CD4TABS 420 11/22/2019   CD4TABS 466 08/08/2019   CD4TABS 345 (L) 07/20/2018   Lab Results  Component Value Date   HIV1RNAQUANT <20 (H) 11/22/2019   Lab Results  Component Value Date   HEPBSAB NONREACTIVE 03/03/2012   Lab Results  Component Value Date   LABRPR NON-REACTIVE 08/08/2019    CBC Lab Results  Component Value Date   WBC 6.5 11/22/2019   RBC 4.74 11/22/2019   HGB 14.1 11/22/2019   HCT 42.2 11/22/2019   PLT 182 11/22/2019   MCV 89.0 11/22/2019   MCH 29.7 11/22/2019   MCHC 33.4 11/22/2019   RDW 13.9 11/22/2019   LYMPHSABS 2,288 11/22/2019   MONOABS 1.1 (H) 02/11/2019   EOSABS 91 11/22/2019    BMET Lab Results  Component Value Date   NA 140 11/22/2019  K 4.1 11/22/2019   CL 107 11/22/2019   CO2 26 11/22/2019   GLUCOSE 223 (H) 11/22/2019   BUN 21 11/22/2019   CREATININE 1.75 (H) 11/22/2019   CALCIUM 9.1 11/22/2019   GFRNONAA 44 (L) 11/22/2019   GFRAA 51 (L) 11/22/2019      Assessment and Plan  hiv = well, continue biktarvy  htn = takes meds in the afternoon.  iddm with kidney involvement = check hgba1c. Needs refill pen needles  ckd 3, proteinuria = may need referral to nephrologist; recheck cr  Health maintenance = will do flu and booster shot of pfizer - for Friday. Needs referral for colonoscopy.  Smoke cessation = wants to stop.

## 2020-01-02 LAB — HEMOGLOBIN A1C
Hgb A1c MFr Bld: 6.2 % of total Hgb — ABNORMAL HIGH (ref <5.7)
Mean Plasma Glucose: 131 (calc)
eAG (mmol/L): 7.3 (calc)

## 2020-01-02 LAB — MICROALBUMIN / CREATININE URINE RATIO
Creatinine, Urine: 62 mg/dL (ref 20–320)
Microalb Creat Ratio: 1161 mcg/mg creat — ABNORMAL HIGH (ref <30)
Microalb, Ur: 72 mg/dL

## 2020-01-05 ENCOUNTER — Other Ambulatory Visit: Payer: Self-pay

## 2020-01-05 ENCOUNTER — Ambulatory Visit (INDEPENDENT_AMBULATORY_CARE_PROVIDER_SITE_OTHER): Payer: Self-pay

## 2020-01-05 DIAGNOSIS — Z23 Encounter for immunization: Secondary | ICD-10-CM

## 2020-01-05 NOTE — Progress Notes (Signed)
   Covid-19 Vaccination Clinic  Name:  Almus Woodham    MRN: [id number] DOB: [birth year]  01/05/2020  Mr. Jenkinson was observed post Covid-19 immunization for 15 minutes without incident. He was provided with Vaccine Information Sheet and instruction to access the V-Safe system.   Mr. Cuff was instructed to call 911 with any severe reactions post vaccine: Marland Kitchen Difficulty breathing  . Swelling of face and throat  . A fast heartbeat  . A bad rash all over body  . Dizziness and weakness  Taren Dymek T Pricilla Loveless

## 2020-01-24 ENCOUNTER — Other Ambulatory Visit: Payer: Self-pay | Admitting: Nurse Practitioner

## 2020-01-24 ENCOUNTER — Encounter: Payer: Self-pay | Admitting: Nurse Practitioner

## 2020-01-24 ENCOUNTER — Telehealth (INDEPENDENT_AMBULATORY_CARE_PROVIDER_SITE_OTHER): Payer: Self-pay | Admitting: Nurse Practitioner

## 2020-01-24 DIAGNOSIS — R059 Cough, unspecified: Secondary | ICD-10-CM

## 2020-01-24 MED ORDER — BENZONATATE 100 MG PO CAPS
100.0000 mg | ORAL_CAPSULE | Freq: Three times a day (TID) | ORAL | 0 refills | Status: DC
Start: 2020-01-24 — End: 2020-01-24

## 2020-01-24 MED FILL — BENZONATATE 100 MG CAPS: 100 | 15 days supply | Qty: 45 | Fill #0

## 2020-01-24 NOTE — Progress Notes (Signed)
   [facility] Patient [facility] [address] [place], [place]  [address] Phone:  [phone]   Fax:  [phone] Virtual Visit via Telephone Note  I connected with Donald Wheeler on 01/24/20 at  9:50 AM EST by telephone and verified that I am speaking with the correct person using two identifiers.   I discussed the limitations, risks, security and privacy concerns of performing an evaluation and management service by telephone and the availability of in person appointments. I also discussed with the patient that there may be a patient responsible charge related to this service. The patient expressed understanding and agreed to proceed.  Patient home Provider Office  History of Present Illness:   Upper Respiratory Infection Patient complains of symptoms of a URI. Symptoms include cough described as productive of green sputum. Onset of symptoms was 1 week ago, and has been gradually improving since that time. Treatment to date: cough suppressants. It is somewhat effective. Denies headache, dizziness, visual changes, shortness of breath, dyspnea on exertion, chest pain, nausea, vomiting or any edema.   Observations/Objective: No exam due to televisit  Assessment and Plan: Assessment  Primary Diagnosis & Pertinent Problem List: The encounter diagnosis was Cough.  Visit Diagnosis: 1. Cough  Encourage patient to continue with cough medication since this is effective.  Patient requesting additional cough medicine.  Benzonatate 100 mg 3 times daily. Patient encouraged to follow-up if his symptoms progress or get worse.  Patient verbalized understanding.    Follow Up Instructions:    I discussed the assessment and treatment plan with the patient. The patient was provided an opportunity to ask questions and all were answered. The patient agreed with the plan and demonstrated an understanding of the instructions.   The patient was advised to call back or seek an in-person evaluation if  the symptoms worsen or if the condition fails to improve as anticipated.  I provided 5.35 minutes of non-face-to-face time during this encounter.   Barbette Merino, NP

## 2020-03-05 MED FILL — TRUEPLUS PEN NDL 31GX5/16: 31G X 8 MM | 25 days supply | Qty: 100 | Fill #0

## 2020-03-20 ENCOUNTER — Telehealth: Payer: Self-pay | Admitting: Nurse Practitioner

## 2020-03-20 ENCOUNTER — Other Ambulatory Visit: Payer: Self-pay | Admitting: Nurse Practitioner

## 2020-03-20 ENCOUNTER — Other Ambulatory Visit: Payer: Self-pay | Admitting: Family Medicine

## 2020-03-20 DIAGNOSIS — E119 Type 2 diabetes mellitus without complications: Secondary | ICD-10-CM

## 2020-03-20 MED ORDER — METFORMIN HCL 500 MG PO TABS: 500.0000 mg | ORAL_TABLET | Freq: Two times a day (BID) | ORAL | 3 refills | Status: DC

## 2020-03-20 MED FILL — METFORMIN HCL 500 MG TABS: 500 | 30 days supply | Qty: 60 | Fill #0

## 2020-03-20 NOTE — Telephone Encounter (Signed)
Refill sent.

## 2020-04-02 ENCOUNTER — Other Ambulatory Visit: Payer: Self-pay | Admitting: Nurse Practitioner

## 2020-04-02 DIAGNOSIS — E119 Type 2 diabetes mellitus without complications: Secondary | ICD-10-CM

## 2020-04-02 DIAGNOSIS — I10 Essential (primary) hypertension: Secondary | ICD-10-CM

## 2020-05-26 ENCOUNTER — Other Ambulatory Visit: Payer: Self-pay | Admitting: Family Medicine

## 2020-05-26 DIAGNOSIS — E119 Type 2 diabetes mellitus without complications: Secondary | ICD-10-CM

## 2020-06-17 ENCOUNTER — Telehealth: Payer: Self-pay

## 2020-06-17 ENCOUNTER — Other Ambulatory Visit: Payer: Self-pay

## 2020-06-17 MED FILL — Insulin Pen Needle 31 G X 8 MM (1/3" or 5/16"): 25 days supply | Qty: 100 | Fill #0 | Status: AC

## 2020-06-17 NOTE — Telephone Encounter (Signed)
Patient here as walk-in, needs refills on Biktarvy, Metformin, atorvastatin and insulin needle pens. RN advised patient to contact his PCP Thad Ranger for refills on Metformin and atorvastatin as she is the one who prescribes these. Provided patient with his PCP's phone number. RN called [facility], they state patient has refills on file for insulin pen needles, patient made aware.   Patient's ADAP is expired, patient provided with 14 days of Biktarvy samples (see sample log for details) per Dr. Drue Second. Patient states he still has a few pills of Biktarvy left. Patient to renew ADAP with financial today.   Sandie Ano, RN

## 2020-06-18 ENCOUNTER — Encounter: Payer: Self-pay | Admitting: Internal Medicine

## 2020-06-22 ENCOUNTER — Other Ambulatory Visit: Payer: Self-pay | Admitting: Internal Medicine

## 2020-07-15 ENCOUNTER — Other Ambulatory Visit: Payer: Self-pay

## 2020-07-15 ENCOUNTER — Ambulatory Visit (INDEPENDENT_AMBULATORY_CARE_PROVIDER_SITE_OTHER): Payer: Self-pay | Admitting: Internal Medicine

## 2020-07-15 ENCOUNTER — Encounter: Payer: Self-pay | Admitting: Internal Medicine

## 2020-07-15 VITALS — BP 136/89 | HR 66 | Resp 16 | Ht 66.0 in | Wt 191.0 lb

## 2020-07-15 DIAGNOSIS — B20 Human immunodeficiency virus [HIV] disease: Secondary | ICD-10-CM

## 2020-07-15 DIAGNOSIS — N183 Chronic kidney disease, stage 3 unspecified: Secondary | ICD-10-CM

## 2020-07-15 DIAGNOSIS — E119 Type 2 diabetes mellitus without complications: Secondary | ICD-10-CM

## 2020-07-15 DIAGNOSIS — F320 Major depressive disorder, single episode, mild: Secondary | ICD-10-CM

## 2020-07-15 MED ORDER — METFORMIN HCL 500 MG PO TABS: 500.0000 mg | ORAL_TABLET | Freq: Every day | ORAL | 1 refills | Status: DC

## 2020-07-15 MED ORDER — FLUOXETINE HCL 20 MG PO CAPS: 20.0000 mg | ORAL_CAPSULE | Freq: Every day | ORAL | 3 refills | Status: DC

## 2020-07-15 MED ORDER — GUAIFENESIN-DM 100-10 MG/5ML PO SYRP: 5.0000 mL | ORAL_SOLUTION | ORAL | 0 refills | Status: DC | PRN

## 2020-07-15 MED ORDER — ATORVASTATIN CALCIUM 80 MG PO TABS: ORAL_TABLET | ORAL | 11 refills | Status: DC

## 2020-07-15 MED ORDER — BIKTARVY 50-200-25 MG PO TABS: 1.0000 | ORAL_TABLET | Freq: Every day | ORAL | 11 refills | Status: DC

## 2020-07-15 NOTE — Progress Notes (Signed)
RFV: follow up for hiv disease  Patient ID: Donald Wheeler, male   DOB: April 11, 1970, 50 y.o.   MRN: [id number]  HPI 50yo M with hiv disease, CD 4 count of 420/VL<20 in Sep 2021 on biktarvy. Also has HLD, HTN, T2DM, CKD 3   Has 3 doses of covid vaccine thus far. He reports that he Has had cold like symptoms for 3-4 days. Feels like it is getting better. Declined from covid testing. He has not been in close contact with others. Staying at home.  Mostly congestion, with cough.   Outpatient Encounter Medications as of 07/15/2020  Medication Sig  . amLODipine (NORVASC) 10 MG tablet TAKE 1 TABLET(10 MG) BY MOUTH DAILY  . ASPIRIN LOW DOSE 81 MG EC tablet TAKE 1 TABLET(81 MG) BY MOUTH DAILY  . atorvastatin (LIPITOR) 80 MG tablet TAKE 1 TABLET BY MOUTH DAILY  . benzonatate (TESSALON) 100 MG capsule TAKE 1 CAPSULE (100 MG TOTAL) BY MOUTH 3 (THREE) TIMES DAILY FOR 15 DAYS.  Marland Kitchen BIKTARVY 50-200-25 MG TABS tablet TAKE 1 TABLET BY MOUTH DAILY  . blood glucose meter kit and supplies KIT Dispense based on patient and insurance preference. Use up to four times daily as directed. (FOR ICD-9 250.00, 250.01).  . Insulin Pen Needle (PEN NEEDLES) 31G X 8 MM MISC 1 each by Does not apply route as directed.  . Insulin Pen Needle 31G X 8 MM MISC USE AS DIRECTED  . LEVEMIR FLEXTOUCH 100 UNIT/ML FlexPen ADMINISTER 25 UNITS UNDER THE SKIN DAILY  . losartan-hydrochlorothiazide (HYZAAR) 50-12.5 MG tablet Take 1 tablet by mouth daily.  . metFORMIN (GLUCOPHAGE) 500 MG tablet TAKE 1 TABLET BY MOUTH TWICE DAILY WITH A MEAL.Marland KitchenMarland KitchenNEED APPOINTMENT FOR MORE REFILLS   No facility-administered encounter medications on file as of 07/15/2020.     Patient Active Problem List   Diagnosis Date Noted  . Controlled type 2 diabetes mellitus with microalbuminuria, with long-term current use of insulin ([place]) 08/09/2019  . Vitamin D deficiency 05/10/2019  . Shingles 07/2018  . Hyperosmolar non-ketotic state in patient with type 2 diabetes  mellitus ([place]) 12/23/2017  . Diabetes mellitus, new onset ([place]) 12/23/2017  . History of hematuria 08/16/2017  . Mixed hyperlipidemia 06/16/2017  . 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 Solitary pulmonary nodule 04/10/2017  . Focal neurological deficit 04/09/2017  . CKD (chronic kidney disease) stage 3, GFR 30-59 ml/min 04/09/2017  . Smoker 04/09/2017  . HTN (hypertension), benign 09/07/2013  . HIV (human immunodeficiency virus infection) ([place]) 03/17/2012     Health Maintenance Due  Topic Date Due  . OPHTHALMOLOGY EXAM  Never done  . FOOT EXAM  02/04/2019  . COVID-19 Vaccine (4 - Booster for Pfizer series) 07/05/2020  . HEMOGLOBIN A1C  07/01/2020     Review of Systems 12 point ros is negative except what is mentioned in hpi Physical Exam   BP 136/89   Pulse 66   Resp 16   Ht _0  (1.676 m)   Wt 191 lb (86.6 kg)   SpO2 95%   BMI 30.83 kg/m   Physical Exam  Constitutional: He is oriented to person, place, and time. He appears well-developed and well-nourished. No distress.  HENT:  Mouth/Throat: Oropharynx is clear and moist. No oropharyngeal exudate.  Cardiovascular: Normal rate, regular rhythm and normal heart sounds. Exam reveals no gallop and no friction rub.  No murmur heard.  Pulmonary/Chest: Effort normal and breath sounds normal. No respiratory distress. He has no wheezes.  Abdominal: Soft.  Bowel sounds are normal. He exhibits no distension. There is no tenderness.  Lymphadenopathy:  He has no cervical adenopathy.  Neurological: He is alert and oriented to person, place, and time.  Skin: Skin is warm and dry. No rash noted. No erythema.  Psychiatric: He has a normal mood and affect. His behavior is normal.    Lab Results  Component Value Date   CD4TCELL 20 (L) 11/22/2019   Lab Results  Component Value Date   CD4TABS 420 11/22/2019   CD4TABS 466 08/08/2019   CD4TABS 345 (L) 07/20/2018   Lab Results  Component Value Date    HIV1RNAQUANT <20 (H) 11/22/2019   Lab Results  Component Value Date   HEPBSAB NONREACTIVE 03/03/2012   Lab Results  Component Value Date   LABRPR NON-REACTIVE 08/08/2019    CBC Lab Results  Component Value Date   WBC 6.5 11/22/2019   RBC 4.74 11/22/2019   HGB 14.1 11/22/2019   HCT 42.2 11/22/2019   PLT 182 11/22/2019   MCV 89.0 11/22/2019   MCH 29.7 11/22/2019   MCHC 33.4 11/22/2019   RDW 13.9 11/22/2019   LYMPHSABS 2,288 11/22/2019   MONOABS 1.1 (H) 02/11/2019   EOSABS 91 11/22/2019    BMET Lab Results  Component Value Date   NA 140 11/22/2019   K 4.1 11/22/2019   CL 107 11/22/2019   CO2 26 11/22/2019   GLUCOSE 223 (H) 11/22/2019   BUN 21 11/22/2019   CREATININE 1.75 (H) 11/22/2019   CALCIUM 9.1 11/22/2019   GFRNONAA 44 (L) 11/22/2019   GFRAA 51 (L) 11/22/2019      Assessment and Plan  hiv disease= will check labs ckd 3 = will check cr, ua, urine protein T2DM = will ive refills on metformin Health maitnenance = will rec 2nd booster Depression = moderate, open to counselor, and will start on prozac. appt with janet

## 2020-07-16 LAB — T-HELPER CELL (CD4) - (RCID CLINIC ONLY)
CD4 % Helper T Cell: 22 % — ABNORMAL LOW (ref 33–65)
CD4 T Cell Abs: 454 /uL (ref 400–1790)

## 2020-07-17 LAB — LIPID PANEL
Cholesterol: 110 mg/dL (ref <200)
HDL: 31 mg/dL — ABNORMAL LOW (ref >=40)
LDL Cholesterol (Calc): 62 mg/dL (calc)
Non-HDL Cholesterol (Calc): 79 mg/dL (calc) (ref <130)
Total CHOL/HDL Ratio: 3.5 (calc) (ref <5.0)
Triglycerides: 91 mg/dL (ref <150)

## 2020-07-17 LAB — COMPLETE METABOLIC PANEL WITH GFR
AG Ratio: 1.3 (calc) (ref 1.0–2.5)
ALT: 15 U/L (ref 9–46)
AST: 16 U/L (ref 10–35)
Albumin: 4.1 g/dL (ref 3.6–5.1)
Alkaline phosphatase (APISO): 83 U/L (ref 35–144)
BUN/Creatinine Ratio: 11 (calc) (ref 6–22)
BUN: 17 mg/dL (ref 7–25)
CO2: 29 mmol/L (ref 20–32)
Calcium: 8.6 mg/dL (ref 8.6–10.3)
Chloride: 103 mmol/L (ref 98–110)
Creat: 1.51 mg/dL — ABNORMAL HIGH (ref 0.70–1.33)
GFR, Est African American: 61 mL/min/{1.73_m2} (ref >=60)
GFR, Est Non African American: 52 mL/min/{1.73_m2} — ABNORMAL LOW (ref >=60)
Globulin: 3.1 g/dL (calc) (ref 1.9–3.7)
Glucose, Bld: 77 mg/dL (ref 65–99)
Potassium: 4.7 mmol/L (ref 3.5–5.3)
Sodium: 139 mmol/L (ref 135–146)
Total Bilirubin: 0.4 mg/dL (ref 0.2–1.2)
Total Protein: 7.2 g/dL (ref 6.1–8.1)

## 2020-07-17 LAB — CBC WITH DIFFERENTIAL/PLATELET
Absolute Monocytes: 422 cells/uL (ref 200–950)
Basophils Absolute: 13 cells/uL (ref 0–200)
Basophils Relative: 0.2 %
Eosinophils Absolute: 132 cells/uL (ref 15–500)
Eosinophils Relative: 2.1 %
HCT: 43.8 % (ref 38.5–50.0)
Hemoglobin: 14.5 g/dL (ref 13.2–17.1)
Lymphs Abs: 2470 cells/uL (ref 850–3900)
MCH: 29 pg (ref 27.0–33.0)
MCHC: 33.1 g/dL (ref 32.0–36.0)
MCV: 87.6 fL (ref 80.0–100.0)
MPV: 11.2 fL (ref 7.5–12.5)
Monocytes Relative: 6.7 %
Neutro Abs: 3263 cells/uL (ref 1500–7800)
Neutrophils Relative %: 51.8 %
Platelets: 217 10*3/uL (ref 140–400)
RBC: 5 10*6/uL (ref 4.20–5.80)
RDW: 14.2 % (ref 11.0–15.0)
Total Lymphocyte: 39.2 %
WBC: 6.3 10*3/uL (ref 3.8–10.8)

## 2020-07-17 LAB — HEMOGLOBIN A1C
Hgb A1c MFr Bld: 6.5 % of total Hgb — ABNORMAL HIGH (ref <5.7)
Mean Plasma Glucose: 140 mg/dL
eAG (mmol/L): 7.7 mmol/L

## 2020-07-17 LAB — RPR: RPR Ser Ql: NONREACTIVE

## 2020-07-17 LAB — HIV-1 RNA QUANT-NO REFLEX-BLD
HIV 1 RNA Quant: NOT DETECTED Copies/mL
HIV-1 RNA Quant, Log: NOT DETECTED Log cps/mL

## 2020-07-28 ENCOUNTER — Other Ambulatory Visit: Payer: Self-pay | Admitting: Internal Medicine

## 2020-07-29 ENCOUNTER — Other Ambulatory Visit: Payer: Self-pay | Admitting: Internal Medicine

## 2020-08-13 ENCOUNTER — Ambulatory Visit: Payer: Self-pay

## 2020-08-13 ENCOUNTER — Other Ambulatory Visit: Payer: Self-pay

## 2020-08-13 ENCOUNTER — Encounter: Payer: Self-pay | Admitting: Internal Medicine

## 2020-08-13 ENCOUNTER — Ambulatory Visit (INDEPENDENT_AMBULATORY_CARE_PROVIDER_SITE_OTHER): Payer: Self-pay | Admitting: Internal Medicine

## 2020-08-13 VITALS — BP 167/94 | HR 65 | Temp 98.3°F | Wt 194.0 lb

## 2020-08-13 DIAGNOSIS — I10 Essential (primary) hypertension: Secondary | ICD-10-CM

## 2020-08-13 DIAGNOSIS — E118 Type 2 diabetes mellitus with unspecified complications: Secondary | ICD-10-CM

## 2020-08-13 DIAGNOSIS — M79602 Pain in left arm: Secondary | ICD-10-CM

## 2020-08-13 DIAGNOSIS — B20 Human immunodeficiency virus [HIV] disease: Secondary | ICD-10-CM

## 2020-08-13 MED ORDER — AMLODIPINE BESYLATE 10 MG PO TABS: ORAL_TABLET | ORAL | 11 refills | Status: DC

## 2020-08-13 MED ORDER — LEVEMIR FLEXTOUCH 100 UNIT/ML ~~LOC~~ SOPN: PEN_INJECTOR | SUBCUTANEOUS | 3 refills | Status: DC

## 2020-08-13 NOTE — Progress Notes (Signed)
RFV: follow up for HIV disease  Patient ID: Donald Wheeler, male   DOB: Mar 02, 1971, 50 y.o.   MRN: [id number]  HPI 50yo M with well controlled hiv disease, CD 4 count 454/VL<20 on biktarvy, on May 2022. He ran out amlodipine. He states that over the last 2 weeks, he has noticed increased pain to the left forearm. No trauma. Has not tried any heat/cold compress nor any anti inflammatories.  Outpatient Encounter Medications as of 08/13/2020  Medication Sig  . amLODipine (NORVASC) 10 MG tablet TAKE 1 TABLET(10 MG) BY MOUTH DAILY  . ASPIRIN LOW DOSE 81 MG EC tablet TAKE 1 TABLET(81 MG) BY MOUTH DAILY  . atorvastatin (LIPITOR) 80 MG tablet TAKE 1 TABLET BY MOUTH DAILY  . bictegravir-emtricitabine-tenofovir AF (BIKTARVY) 50-200-25 MG TABS tablet Take 1 tablet by mouth daily.  . blood glucose meter kit and supplies KIT Dispense based on patient and insurance preference. Use up to four times daily as directed. (FOR ICD-9 250.00, 250.01).  . Insulin Pen Needle (PEN NEEDLES) 31G X 8 MM MISC 1 each by Does not apply route as directed.  . Insulin Pen Needle 31G X 8 MM MISC USE AS DIRECTED  . LEVEMIR FLEXTOUCH 100 UNIT/ML FlexPen ADMINISTER 25 UNITS UNDER THE SKIN DAILY  . losartan-hydrochlorothiazide (HYZAAR) 50-12.5 MG tablet Take 1 tablet by mouth daily.  . metFORMIN (GLUCOPHAGE) 500 MG tablet Take 1 tablet (500 mg total) by mouth daily with breakfast.  . benzonatate (TESSALON) 100 MG capsule TAKE 1 CAPSULE (100 MG TOTAL) BY MOUTH 3 (THREE) TIMES DAILY FOR 15 DAYS. (Patient not taking: Reported on 08/13/2020)  . FLUoxetine (PROZAC) 20 MG capsule Take 1 capsule (20 mg total) by mouth daily. (Patient not taking: Reported on 08/13/2020)  . guaiFENesin-dextromethorphan (ROBITUSSIN DM) 100-10 MG/5ML syrup Take 5 mLs by mouth every 4 (four) hours as needed for cough. (Patient not taking: Reported on 08/13/2020)   No facility-administered encounter medications on file as of 08/13/2020.     Patient Active  Problem List   Diagnosis Date Noted  . Controlled type 2 diabetes mellitus with microalbuminuria, with long-term current use of insulin ([place]) 08/09/2019  . Vitamin D deficiency 05/10/2019  . Shingles 07/2018  . Hyperosmolar non-ketotic state in patient with type 2 diabetes mellitus ([place]) 12/23/2017  . Diabetes mellitus, new onset ([place]) 12/23/2017  . History of hematuria 08/16/2017  . Mixed hyperlipidemia 06/16/2017  . 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 Solitary pulmonary nodule 04/10/2017  . Focal neurological deficit 04/09/2017  . CKD (chronic kidney disease) stage 3, GFR 30-59 ml/min 04/09/2017  . Smoker 04/09/2017  . HTN (hypertension), benign 09/07/2013  . HIV (human immunodeficiency virus infection) ([place]) 03/17/2012     Health Maintenance Due  Topic Date Due  . OPHTHALMOLOGY EXAM  Never done  . Zoster Vaccines- Shingrix (1 of 2) Never done  . FOOT EXAM  02/04/2019  . COVID-19 Vaccine (4 - Booster for Pfizer series) 04/06/2020     Review of Systems Review of Systems  Constitutional: Negative for fever, chills, diaphoresis, activity change, appetite change, fatigue and unexpected weight change.  HENT: Negative for congestion, sore throat, rhinorrhea, sneezing, trouble swallowing and sinus pressure.  Eyes: Negative for photophobia and visual disturbance.  Respiratory: Negative for cough, chest tightness, shortness of breath, wheezing and stridor.  Cardiovascular: Negative for chest pain, palpitations and leg swelling.  Gastrointestinal: Negative for nausea, vomiting, abdominal pain, diarrhea, constipation, blood in stool, abdominal distention and anal bleeding.  Genitourinary: Negative for dysuria,  hematuria, flank pain and difficulty urinating.  Musculoskeletal: Negative for myalgias, back pain, joint swelling, arthralgias and gait problem.  Skin: Negative for color change, pallor, rash and wound.  Neurological: Negative for dizziness, tremors,  weakness and light-headedness.  Hematological: Negative for adenopathy. Does not bruise/bleed easily.  Psychiatric/Behavioral: Negative for behavioral problems, confusion, sleep disturbance, dysphoric mood, decreased concentration and agitation.    Physical Exam   BP (!) 167/94   Pulse 65   Temp 98.3 F (36.8 C) (Oral)   Wt 194 lb (88 kg)   SpO2 99%   BMI 31.31 kg/m   Physical Exam  Constitutional:  oriented to person, place, and time. appears well-developed and well-nourished. No distress.  HENT: [place]/AT, PERRLA, no scleral icterus Mouth/Throat: Oropharynx is clear and moist. No oropharyngeal exudate.  Cardiovascular: Normal rate, regular rhythm and normal heart sounds. Exam reveals no gallop and no friction rub.  No murmur heard.  Pulmonary/Chest: Effort normal and breath sounds normal. No respiratory distress.  has no wheezes.  Neck = supple, no nuchal rigidity Abdominal: Soft. Bowel sounds are normal.  exhibits no distension. There is no tenderness.  Lymphadenopathy: no cervical adenopathy. No axillary adenopathy Neurological: alert and oriented to person, place, and time.  Skin: Skin is warm and dry. No rash noted. No erythema.  Ext: left forearm swelling and tenderness  Lab Results  Component Value Date   CD4TCELL 22 (L) 07/15/2020   Lab Results  Component Value Date   CD4TABS 454 07/15/2020   CD4TABS 420 11/22/2019   CD4TABS 466 08/08/2019   Lab Results  Component Value Date   HIV1RNAQUANT Not Detected 07/15/2020   Lab Results  Component Value Date   HEPBSAB NONREACTIVE 03/03/2012   Lab Results  Component Value Date   LABRPR NON-REACTIVE 07/15/2020    CBC Lab Results  Component Value Date   WBC 6.3 07/15/2020   RBC 5.00 07/15/2020   HGB 14.5 07/15/2020   HCT 43.8 07/15/2020   PLT 217 07/15/2020   MCV 87.6 07/15/2020   MCH 29.0 07/15/2020   MCHC 33.1 07/15/2020   RDW 14.2 07/15/2020   LYMPHSABS 2,470 07/15/2020   MONOABS 1.1 (H) 02/11/2019    EOSABS 132 07/15/2020    BMET Lab Results  Component Value Date   NA 139 07/15/2020   K 4.7 07/15/2020   CL 103 07/15/2020   CO2 29 07/15/2020   GLUCOSE 77 07/15/2020   BUN 17 07/15/2020   CREATININE 1.51 (H) 07/15/2020   CALCIUM 8.6 07/15/2020   GFRNONAA 52 (L) 07/15/2020   GFRAA 61 07/15/2020      Assessment and Plan  Uncontrolled hypertension = we will Refill amlodipine  IDDM = we will Refill insulin pen  Left fore arm - concern for DVT/SVT  hiv disease = well controlled. Continue on biktarvy

## 2020-08-19 ENCOUNTER — Ambulatory Visit (HOSPITAL_COMMUNITY)
Admission: RE | Admit: 2020-08-19 | Discharge: 2020-08-19 | Disposition: A | Discharge status: Home / Self Care | Payer: Self-pay | Source: Ambulatory Visit | Attending: Internal Medicine | Admitting: Internal Medicine

## 2020-08-19 ENCOUNTER — Other Ambulatory Visit: Payer: Self-pay

## 2020-08-19 DIAGNOSIS — M79602 Pain in left arm: Secondary | ICD-10-CM

## 2020-08-19 NOTE — Progress Notes (Signed)
VASCULAR LAB    Left upper extremity venous duplex has been performed.  See CV proc for preliminary results.   Soley Harriss, RVT 08/19/2020, 1:20 PM

## 2020-08-22 ENCOUNTER — Ambulatory Visit: Payer: Self-pay

## 2020-09-10 ENCOUNTER — Ambulatory Visit: Payer: Self-pay

## 2020-09-23 ENCOUNTER — Other Ambulatory Visit: Payer: Self-pay

## 2020-10-09 ENCOUNTER — Other Ambulatory Visit: Payer: Self-pay

## 2020-10-09 MED FILL — Insulin Pen Needle 31 G X 8 MM (1/3" or 5/16"): 25 days supply | Qty: 100 | Fill #1 | Status: CN

## 2020-11-03 ENCOUNTER — Other Ambulatory Visit: Payer: Self-pay | Admitting: Internal Medicine

## 2020-11-03 DIAGNOSIS — E119 Type 2 diabetes mellitus without complications: Secondary | ICD-10-CM

## 2020-11-04 NOTE — Telephone Encounter (Signed)
Please advise on refill.

## 2020-11-26 ENCOUNTER — Other Ambulatory Visit: Payer: Self-pay | Admitting: Internal Medicine

## 2020-12-03 ENCOUNTER — Other Ambulatory Visit: Payer: Self-pay

## 2020-12-07 IMAGING — DX DG KNEE COMPLETE 4+V*L*
4 series · 4 of 4 positions shown · non-contrast
Comparison: None.

CLINICAL DATA: Anterior left knee pain and swelling for 2 days. No
reported injury.

EXAM:
LEFT KNEE - COMPLETE 4+ VIEW

[knee ap]
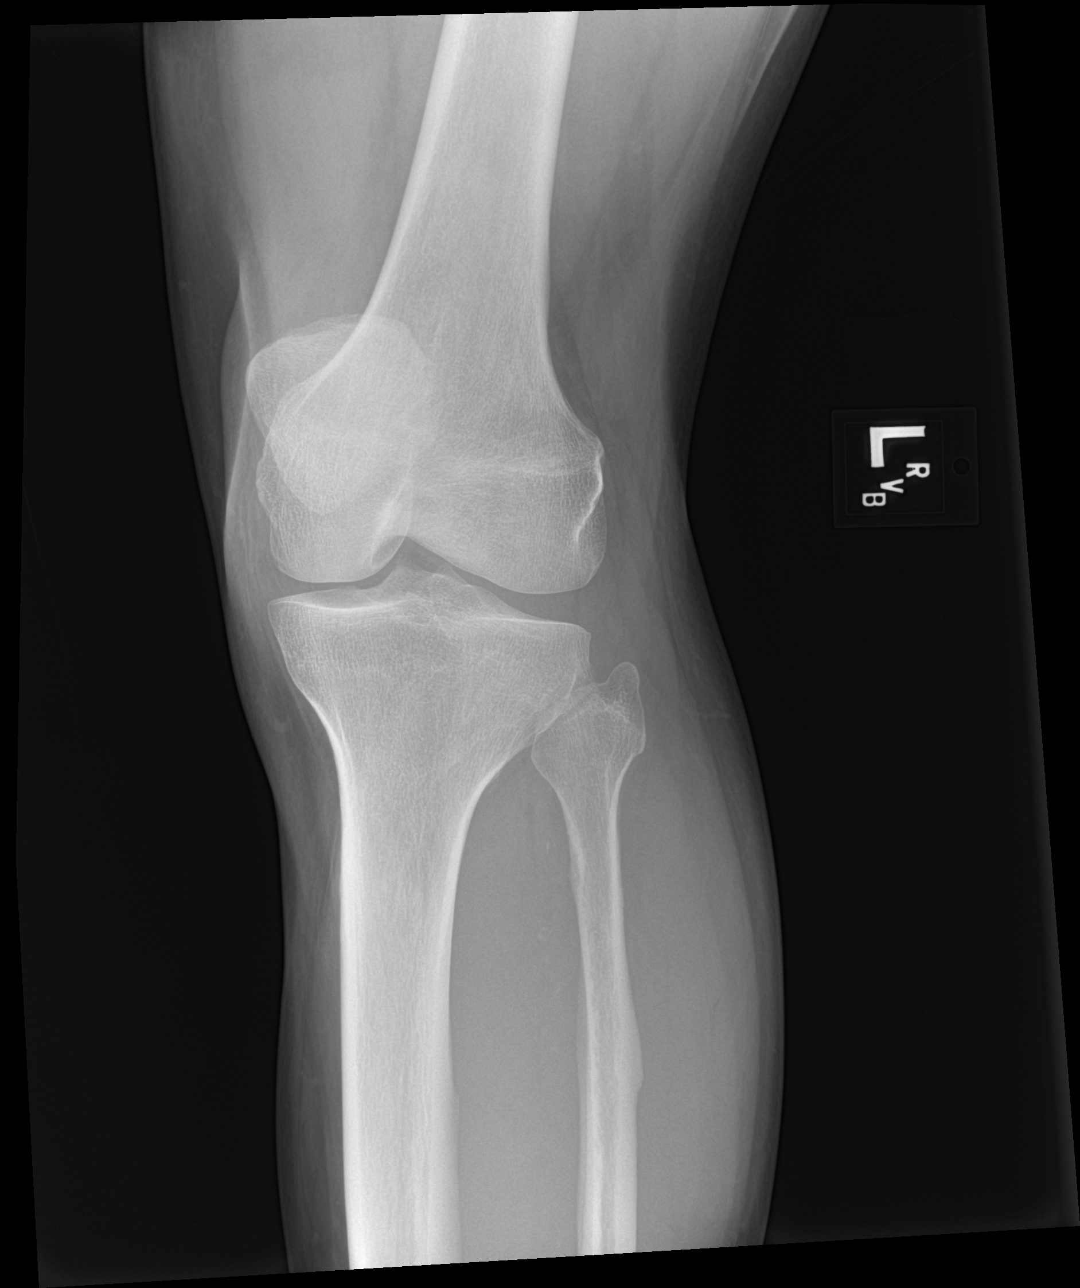

[knee lat]
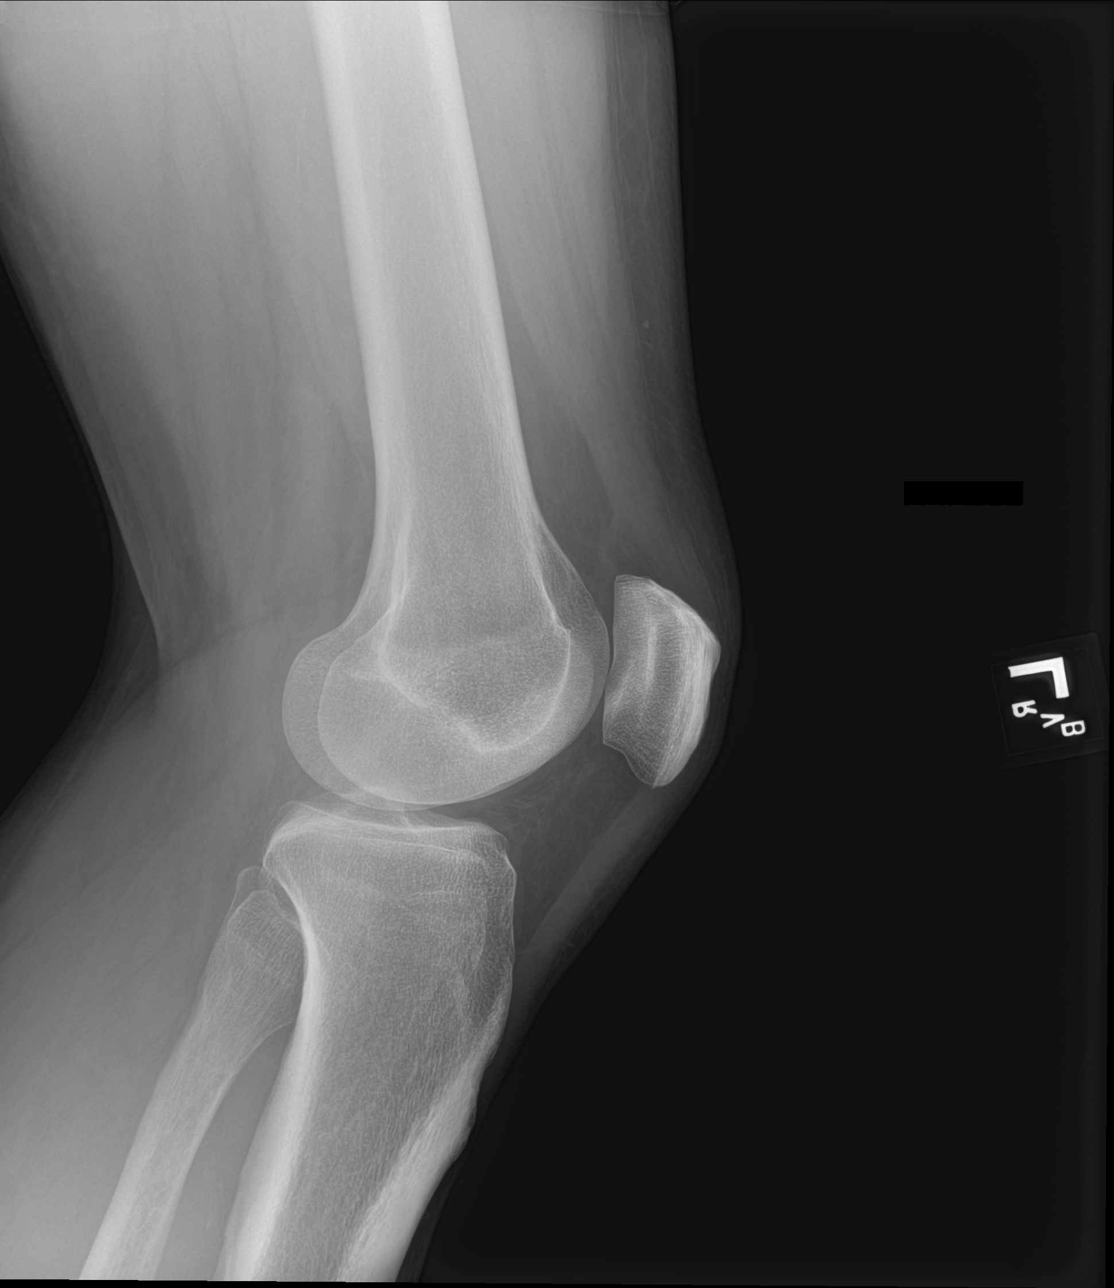

[knee obl (1 of 2)]
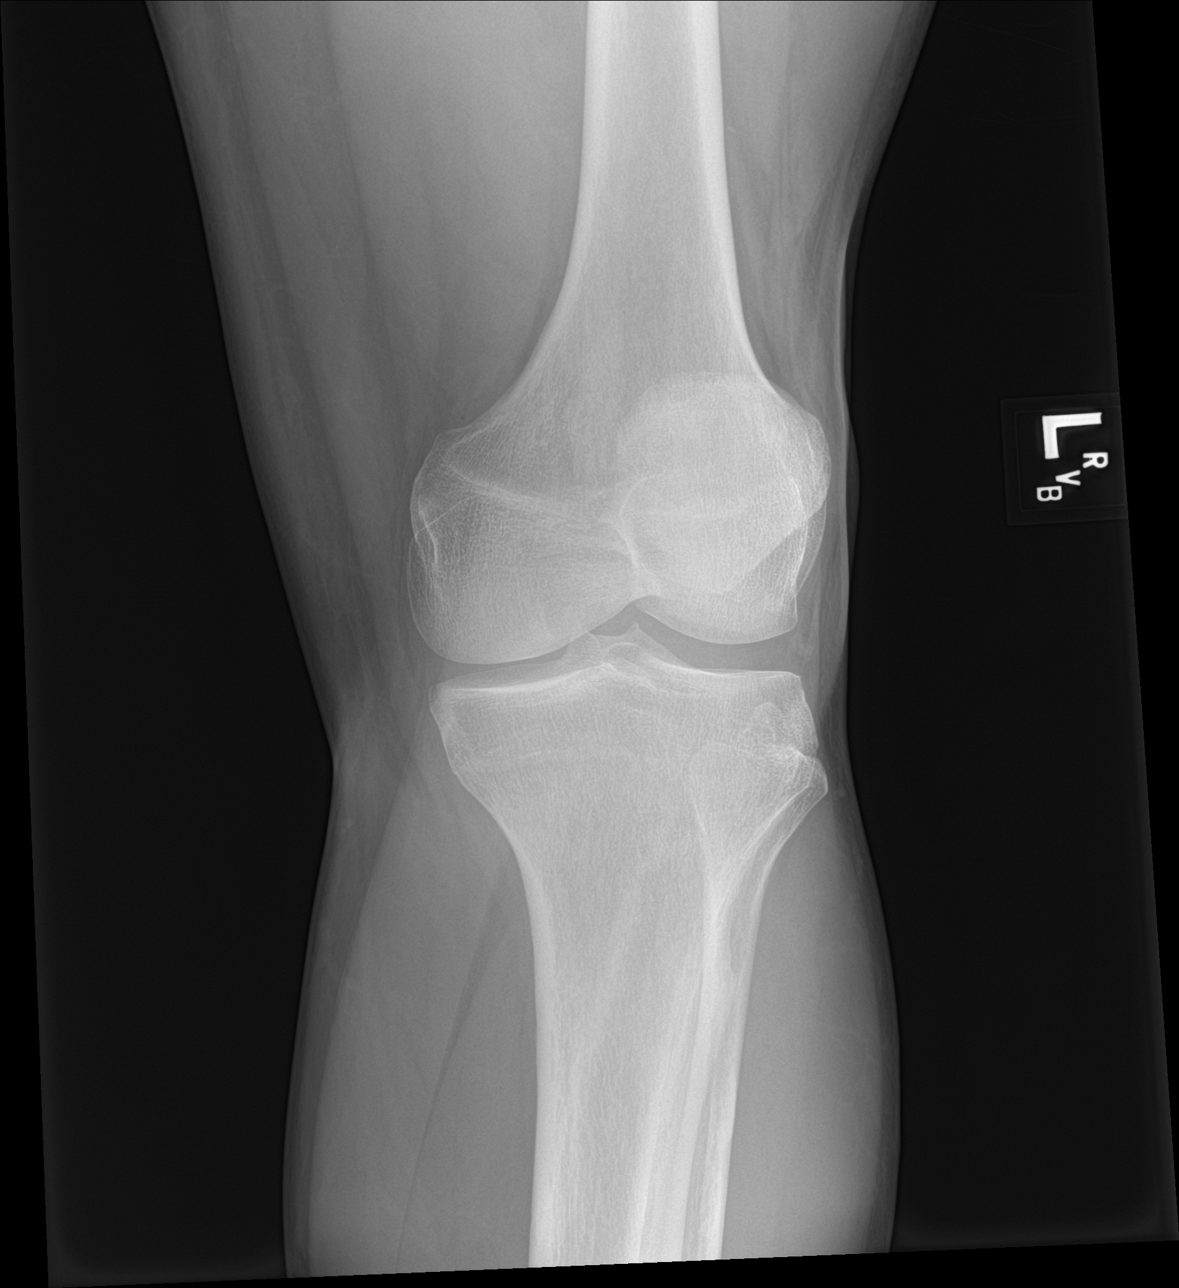

[knee obl (2 of 2)]
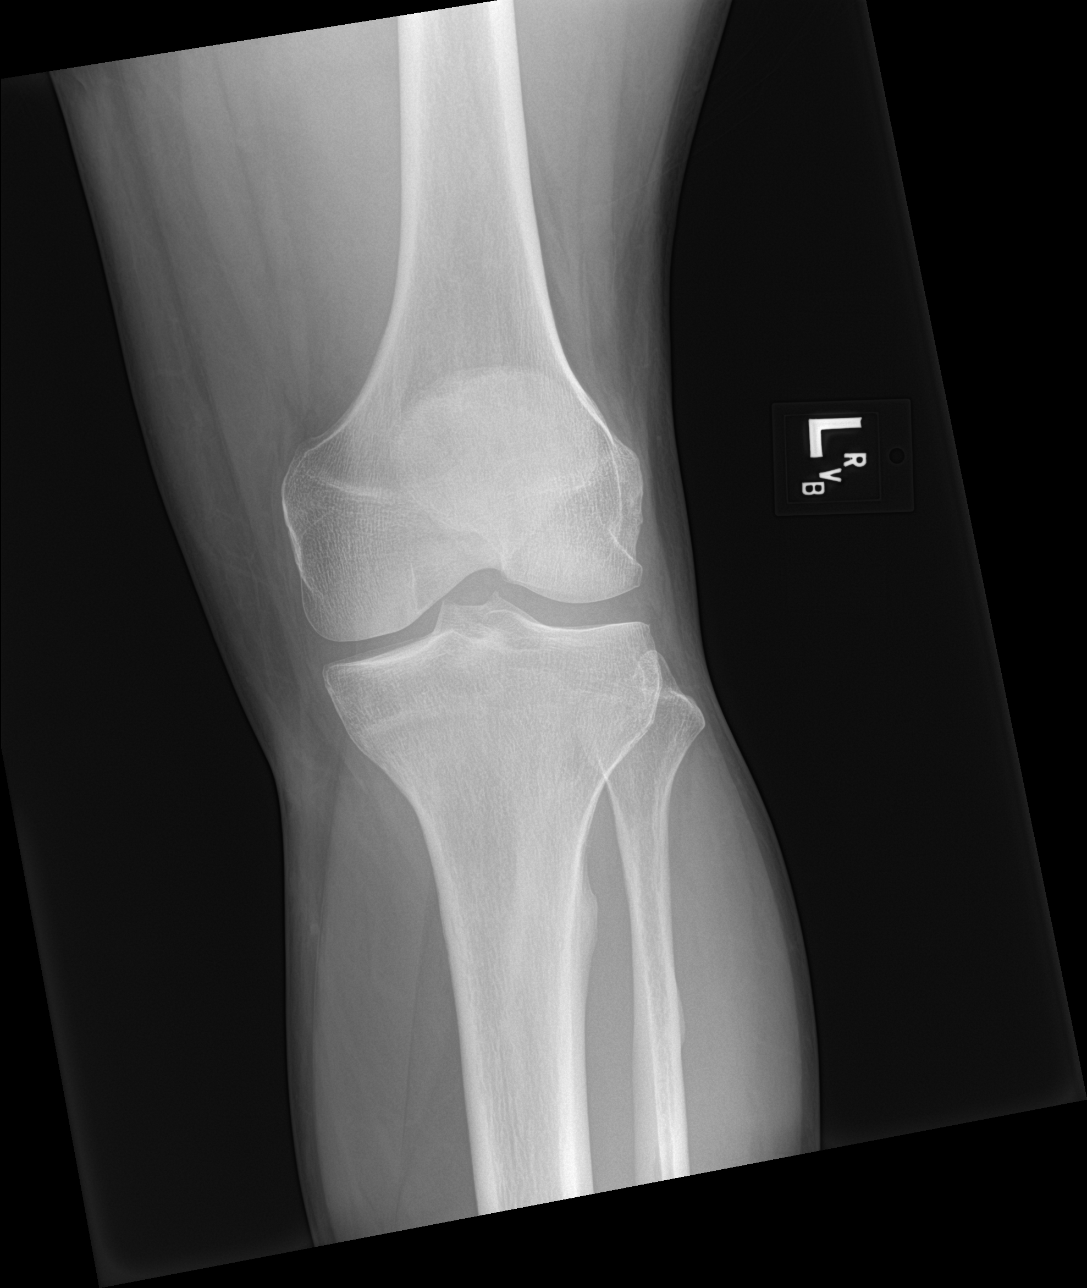

[4 of 4 positions shown; findings below may reference images not displayed]

FINDINGS: No evidence of fracture, dislocation, or joint effusion. No evidence
of arthropathy or other focal bone abnormality. Soft tissues are
unremarkable.
IMPRESSION: No left knee fracture, joint effusion or malalignment. No
significant arthropathy.

## 2020-12-24 ENCOUNTER — Ambulatory Visit: Payer: Self-pay | Admitting: Internal Medicine

## 2020-12-26 ENCOUNTER — Ambulatory Visit: Payer: Self-pay

## 2020-12-26 ENCOUNTER — Other Ambulatory Visit: Payer: Self-pay

## 2021-01-01 ENCOUNTER — Other Ambulatory Visit: Payer: Self-pay | Admitting: Internal Medicine

## 2021-01-01 DIAGNOSIS — E119 Type 2 diabetes mellitus without complications: Secondary | ICD-10-CM

## 2021-01-02 ENCOUNTER — Other Ambulatory Visit: Payer: Self-pay | Admitting: Pharmacist

## 2021-01-02 DIAGNOSIS — B20 Human immunodeficiency virus [HIV] disease: Secondary | ICD-10-CM

## 2021-01-02 MED ORDER — BIKTARVY 50-200-25 MG PO TABS: 1.0000 | ORAL_TABLET | Freq: Every day | ORAL | 0 refills | Status: AC

## 2021-01-02 NOTE — Progress Notes (Addendum)
Medication Samples have been provided to the patient.  Drug name: Biktarvy        Strength: 50/200/25 mg       Qty: 2 bottles (14 tablets)   LOT: [id number]   Exp.Date: 08/15/2022  Dosing instructions: Take one tablet by mouth once daily  The patient has been instructed regarding the correct time, dose, and frequency of taking this medication, including desired effects and most common side effects.   Olean Sangster L. Mikaelah Trostle, PharmD, BCIDP, AAHIVP, CPP Clinical Pharmacist Practitioner Infectious Diseases Clinical Pharmacist [facility] 02/26/2020, 10:07 AM  

## 2021-01-06 ENCOUNTER — Emergency Department (HOSPITAL_COMMUNITY)
Admission: EM | Admit: 2021-01-06 | Discharge: 2021-01-06 | Disposition: A | Discharge status: Home / Self Care | Payer: Self-pay | Attending: Emergency Medicine | Admitting: Emergency Medicine

## 2021-01-06 DIAGNOSIS — R109 Unspecified abdominal pain: Secondary | ICD-10-CM | POA: Insufficient documentation

## 2021-01-06 DIAGNOSIS — N183 Chronic kidney disease, stage 3 unspecified: Secondary | ICD-10-CM | POA: Insufficient documentation

## 2021-01-06 DIAGNOSIS — Z7982 Long term (current) use of aspirin: Secondary | ICD-10-CM | POA: Insufficient documentation

## 2021-01-06 DIAGNOSIS — Z79899 Other long term (current) drug therapy: Secondary | ICD-10-CM | POA: Insufficient documentation

## 2021-01-06 DIAGNOSIS — R110 Nausea: Secondary | ICD-10-CM | POA: Insufficient documentation

## 2021-01-06 DIAGNOSIS — E1122 Type 2 diabetes mellitus with diabetic chronic kidney disease: Secondary | ICD-10-CM | POA: Insufficient documentation

## 2021-01-06 DIAGNOSIS — Z21 Asymptomatic human immunodeficiency virus [HIV] infection status: Secondary | ICD-10-CM | POA: Insufficient documentation

## 2021-01-06 DIAGNOSIS — Z7984 Long term (current) use of oral hypoglycemic drugs: Secondary | ICD-10-CM | POA: Insufficient documentation

## 2021-01-06 DIAGNOSIS — I129 Hypertensive chronic kidney disease with stage 1 through stage 4 chronic kidney disease, or unspecified chronic kidney disease: Secondary | ICD-10-CM | POA: Insufficient documentation

## 2021-01-06 DIAGNOSIS — R140 Abdominal distension (gaseous): Secondary | ICD-10-CM | POA: Insufficient documentation

## 2021-01-06 DIAGNOSIS — F17210 Nicotine dependence, cigarettes, uncomplicated: Secondary | ICD-10-CM | POA: Insufficient documentation

## 2021-01-06 LAB — CBC WITH DIFFERENTIAL/PLATELET
Abs Immature Granulocytes: 0.03 10*3/uL (ref 0.00–0.07)
Basophils Absolute: 0 10*3/uL (ref 0.0–0.1)
Basophils Relative: 0 %
Eosinophils Absolute: 0.1 10*3/uL (ref 0.0–0.5)
Eosinophils Relative: 2 %
HCT: 43.6 % (ref 39.0–52.0)
Hemoglobin: 14.7 g/dL (ref 13.0–17.0)
Immature Granulocytes: 0 %
Lymphocytes Relative: 31 %
Lymphs Abs: 2.1 10*3/uL (ref 0.7–4.0)
MCH: 29.4 pg (ref 26.0–34.0)
MCHC: 33.7 g/dL (ref 30.0–36.0)
MCV: 87.2 fL (ref 80.0–100.0)
Monocytes Absolute: 0.6 10*3/uL (ref 0.1–1.0)
Monocytes Relative: 9 %
Neutro Abs: 3.9 10*3/uL (ref 1.7–7.7)
Neutrophils Relative %: 58 %
Platelets: 203 10*3/uL (ref 150–400)
RBC: 5 MIL/uL (ref 4.22–5.81)
RDW: 14 % (ref 11.5–15.5)
WBC: 6.8 10*3/uL (ref 4.0–10.5)
nRBC: 0 % (ref 0.0–0.2)

## 2021-01-06 LAB — COMPREHENSIVE METABOLIC PANEL
ALT: 18 U/L (ref 0–44)
AST: 18 U/L (ref 15–41)
Albumin: 3.5 g/dL (ref 3.5–5.0)
Alkaline Phosphatase: 73 U/L (ref 38–126)
Anion gap: 9 (ref 5–15)
BUN: 11 mg/dL (ref 6–20)
CO2: 27 mmol/L (ref 22–32)
Calcium: 8.8 mg/dL — ABNORMAL LOW (ref 8.9–10.3)
Chloride: 102 mmol/L (ref 98–111)
Creatinine, Ser: 1.32 mg/dL — ABNORMAL HIGH (ref 0.61–1.24)
GFR, Estimated: >60 mL/min (ref >60)
Glucose, Bld: 163 mg/dL — ABNORMAL HIGH (ref 70–99)
Potassium: 4.1 mmol/L (ref 3.5–5.1)
Sodium: 138 mmol/L (ref 135–145)
Total Bilirubin: 0.8 mg/dL (ref 0.3–1.2)
Total Protein: 6.9 g/dL (ref 6.5–8.1)

## 2021-01-06 LAB — LIPASE, BLOOD: Lipase: 45 U/L (ref 11–51)

## 2021-01-06 MED ORDER — DICYCLOMINE HCL 20 MG PO TABS: 20.0000 mg | ORAL_TABLET | Freq: Two times a day (BID) | ORAL | 0 refills | Status: DC

## 2021-01-06 MED ORDER — ONDANSETRON 4 MG PO TBDP: 4.0000 mg | ORAL_TABLET | Freq: Three times a day (TID) | ORAL | 0 refills | Status: DC | PRN

## 2021-01-06 NOTE — ED Triage Notes (Signed)
Pt. Stated, I started having stomach pain and back pain last Wednesday. I give myself Insulin shots

## 2021-01-06 NOTE — Discharge Instructions (Signed)
Continue taking home medication as prescribed. Zofran as needed for nausea or vomiting. Use Bentyl as needed for abdominal cramping or pain. Use Tylenol as needed for pain. Follow-up with primary care doctor for recheck of symptoms. Return to the emergency room if you develop fevers, persistent vomiting, severe worsening pain, any new, worsening, or concerning symptoms.

## 2021-01-06 NOTE — ED Provider Notes (Signed)
Emergency Medicine Provider Triage Evaluation Note  Donald Wheeler , a 50 y.o. male  was evaluated in triage.  Pt complains of abdominal pain.  Patient states he is having abdominal pain and bloating.  He also is concerned about a rash, and knots where he gives himself insulin his abdomen.  He has been doing subcu insulin injections for several years, but the knots are new for him. Bgls have been elevated, 200-300s  Review of Systems  Positive: Abd pain Negative: fever  Physical Exam  BP (!) 153/106 (BP Location: Left Arm)   Pulse 76   Temp 97.6 F (36.4 C) (Oral)   Resp 17   SpO2 94%  Gen:   Awake, no distress   Resp:  Normal effort  MSK:   Moves extremities without difficulty  Other:  No ttp of abd. No rash or lesions noted. Small, dime sized knots noted in subq tissue. Nontender and not erythematous.   Medical Decision Making  Medically screening exam initiated at 10:44 AM.  Appropriate orders placed.  Donald Wheeler was informed that the remainder of the evaluation will be completed by another provider, this initial triage assessment does not replace that evaluation, and the importance of remaining in the ED until their evaluation is complete.  labs   Alveria Apley, PA-C 01/06/21 1059    Mancel Bale, MD 01/06/21 [phone]

## 2021-01-06 NOTE — ED Provider Notes (Signed)
[facility] EMERGENCY DEPARTMENT Provider Note   CSN: [id number] Arrival date & time: 01/06/21  1001     History Chief Complaint  Patient presents with   Abdominal Pain   Back Pain    Donald Wheeler is a 50 y.o. male presenting for evaluation of abdominal pain and nausea.  Patient states for the past week he has had intermittent abdominal pain, bloating, nausea.  There is nothing he can do to bring on the symptoms.  Nothing makes it better or worse.  No worsening symptoms with p.o. intake.  He is having normal urination and bowel movements, this does not change his pain.  No fevers or chills.  Last BM was yesterday.  No previous abdominal surgeries.  No recent medication changes.  No sick contacts. Additionally, patient is concerned about knots on his abdomen from where he gives himself insulin shots.  This is not a new medicine for him.  Additional history taken chart reviewed.  History of HIV, hypertension, stroke, diabetes  HPI     Past Medical History:  Diagnosis Date   Bell's palsy 09/07/2013   Diabetes mellitus without complication ([place]) 84/6659   NEW ONSET    HIV (human immunodeficiency virus infection) ([place])    dx'd 2000s   Hypertension    Stroke ([place]) 04/09/2017   vs TIA w/acute onset of right-sided weakness as well as trouble speaking/notes 04/09/2017    Patient Active Problem List   Diagnosis Date Noted   Controlled type 2 diabetes mellitus with microalbuminuria, with long-term current use of insulin ([place]) 08/09/2019   Vitamin D deficiency 05/10/2019   Shingles 07/2018   Hyperosmolar non-ketotic state in patient with type 2 diabetes mellitus ([place]) 12/23/2017   Diabetes mellitus, new onset ([place]) 12/23/2017   History of hematuria 08/16/2017   Mixed hyperlipidemia 06/16/2017   Cerebral thrombosis with cerebral infarction 04/10/2017   Solitary pulmonary nodule 04/10/2017   Focal neurological deficit 04/09/2017   CKD (chronic kidney disease)  stage 3, GFR 30-59 ml/min 04/09/2017   Smoker 04/09/2017   HTN (hypertension), benign 09/07/2013   HIV (human immunodeficiency virus infection) ([place]) 03/17/2012    Past Surgical History:  Procedure Laterality Date   HEMORRHOID SURGERY         Family History  Problem Relation Age of Onset   Hypertension Mother    Diabetes Mother     Social History   Tobacco Use   Smoking status: Every Day    Packs/day: 0.20    Years: 15.00    Pack years: 3.00    Types: Cigars, Cigarettes    Last attempt to quit: 12/12/2017    Years since quitting: 3.0   Smokeless tobacco: Never   Tobacco comments:    black and mildsl daily  Vaping Use   Vaping Use: Never used  Substance Use Topics   Alcohol use: Yes    Comment: 04/09/2017 "might have 2 beers/month; if that"   Drug use: Yes    Types: Marijuana    Home Medications Prior to Admission medications   Medication Sig Start Date End Date Taking? Authorizing Provider  dicyclomine (BENTYL) 20 MG tablet Take 1 tablet (20 mg total) by mouth 2 (two) times daily. 01/06/21  Yes Shaylea Ucci, PA-C  ondansetron (ZOFRAN ODT) 4 MG disintegrating tablet Take 1 tablet (4 mg total) by mouth every 8 (eight) hours as needed for nausea or vomiting. 01/06/21  Yes Hazael Olveda, PA-C  amLODipine (NORVASC) 10 MG tablet TAKE 1 TABLET(10 MG) BY MOUTH  DAILY 08/13/20   Carlyle Basques, MD  ASPIRIN LOW DOSE 81 MG EC tablet TAKE 1 TABLET(81 MG) BY MOUTH DAILY 11/27/20   Vevelyn Francois, NP  atorvastatin (LIPITOR) 80 MG tablet TAKE 1 TABLET BY MOUTH DAILY 07/15/20   Carlyle Basques, MD  benzonatate (TESSALON) 100 MG capsule TAKE 1 CAPSULE (100 MG TOTAL) BY MOUTH 3 (THREE) TIMES DAILY FOR 15 DAYS. Patient not taking: Reported on 08/13/2020 01/24/20 01/23/21  Vevelyn Francois, NP  bictegravir-emtricitabine-tenofovir AF (BIKTARVY) 50-200-25 MG TABS tablet Take 1 tablet by mouth daily. 07/15/20   Carlyle Basques, MD  bictegravir-emtricitabine-tenofovir AF (BIKTARVY)  50-200-25 MG TABS tablet Take 1 tablet by mouth daily for 14 days. 12/26/20 01/09/21  Kuppelweiser, Cassie L, RPH-CPP  blood glucose meter kit and supplies KIT Dispense based on patient and insurance preference. Use up to four times daily as directed. (FOR ICD-9 250.00, 250.01). 12/24/17   Geradine Girt, DO  FLUoxetine (PROZAC) 20 MG capsule Take 1 capsule (20 mg total) by mouth daily. Patient not taking: Reported on 08/13/2020 07/15/20   Carlyle Basques, MD  guaiFENesin-dextromethorphan [facility] DM) 100-10 MG/5ML syrup Take 5 mLs by mouth every 4 (four) hours as needed for cough. Patient not taking: Reported on 08/13/2020 07/15/20   Carlyle Basques, MD  insulin detemir (LEVEMIR FLEXTOUCH) 100 UNIT/ML FlexPen Administer 25units under the skin daily 08/13/20   Carlyle Basques, MD  Insulin Pen Needle 32G X 4 MM MISC 1 each by Does not apply route as directed. 01/01/20   Carlyle Basques, MD  losartan-hydrochlorothiazide (HYZAAR) 50-12.5 MG tablet Take 1 tablet by mouth daily. 08/04/19   Vevelyn Francois, NP  metFORMIN (GLUCOPHAGE) 500 MG tablet Take 1 tablet (500 mg total) by mouth daily with breakfast. 07/15/20   Carlyle Basques, MD    Allergies    Patient has no known allergies.  Review of Systems   Review of Systems  Gastrointestinal:  Positive for abdominal distention, abdominal pain and nausea.  All other systems reviewed and are negative.  Physical Exam Updated Vital Signs BP 139/89 (BP Location: Left Arm)   Pulse 68   Temp 97.9 F (36.6 C) (Oral)   Resp 17   SpO2 95%   Physical Exam Vitals and nursing note reviewed.  Constitutional:      General: He is not in acute distress.    Appearance: Normal appearance.     Comments: Resting in the bed in NAD  HENT:     Head: Normocephalic and atraumatic.  Eyes:     Conjunctiva/sclera: Conjunctivae normal.     Pupils: Pupils are equal, round, and reactive to light.  Cardiovascular:     Rate and Rhythm: Normal rate and regular rhythm.      Pulses: Normal pulses.  Pulmonary:     Effort: Pulmonary effort is normal. No respiratory distress.     Breath sounds: Normal breath sounds. No wheezing.     Comments: Speaking in full sentences.  Clear lung sounds in all fields. Abdominal:     General: There is no distension.     Palpations: Abdomen is soft. There is no mass.     Tenderness: There is no abdominal tenderness. There is no guarding or rebound.     Comments: No ttp of the abd. No rigidity or distention. A few small, dime sized "knots" on the abd c/w subq injection sites. No erythema or warmth.   Musculoskeletal:        General: Normal range of motion.  Cervical back: Normal range of motion and neck supple.  Skin:    General: Skin is warm and dry.     Capillary Refill: Capillary refill takes less than 2 seconds.  Neurological:     Mental Status: He is alert and oriented to person, place, and time.  Psychiatric:        Mood and Affect: Mood and affect normal.        Speech: Speech normal.        Behavior: Behavior normal.    ED Results / Procedures / Treatments   Labs (all labs ordered are listed, but only abnormal results are displayed) Labs Reviewed  COMPREHENSIVE METABOLIC PANEL - Abnormal; Notable for the following components:      Result Value   Glucose, Bld 163 (*)    Creatinine, Ser 1.32 (*)    Calcium 8.8 (*)    All other components within normal limits  CBC WITH DIFFERENTIAL/PLATELET  LIPASE, BLOOD    EKG None  Radiology No results found.  Procedures Procedures   Medications Ordered in ED Medications - No data to display  ED Course  I have reviewed the triage vital signs and the nursing notes.  Pertinent labs & imaging results that were available during my care of the patient were reviewed by me and considered in my medical decision making (see chart for details).    MDM Rules/Calculators/A&P                           Pt presenting for evaluation of abd pain and nausea.  On exam,  patient appears nontoxic.  No obvious abdominal exam on my evaluation.  Labs obtained from triage interpreted by me, overall reassuring.  Kidney, liver, pancreatic function normal.  Tolerating p.o. without difficulty.  Discussed that I have low suspicion for acute intra-abdominal pathology or emergency requiring hospitalization or further emergent work-up.  However considering his history, offered CT scan.  Patient declined.  I discussed symptomatic management at home, close follow-up with PCP and prompt return to the ER with any worsening symptoms.  At this time, patient appears safe for discharge.  Return precautions given.  Patient states he understands and agrees to plan.   Final Clinical Impression(s) / ED Diagnoses Final diagnoses:  Intermittent abdominal pain  Nausea    Rx / DC Orders ED Discharge Orders          Ordered    ondansetron (ZOFRAN ODT) 4 MG disintegrating tablet  Every 8 hours PRN        01/06/21 1401    dicyclomine (BENTYL) 20 MG tablet  2 times daily        01/06/21 1401             Alexiya Franqui, PA-C 01/06/21 1429    Daleen Bo, MD 01/06/21 [phone]

## 2021-01-22 ENCOUNTER — Other Ambulatory Visit: Payer: Self-pay | Admitting: Pharmacist

## 2021-01-22 DIAGNOSIS — B20 Human immunodeficiency virus [HIV] disease: Secondary | ICD-10-CM

## 2021-01-22 MED ORDER — BICTEGRAVIR-EMTRICITAB-TENOFOV 50-200-25 MG PO TABS: 1.0000 | ORAL_TABLET | Freq: Every day | ORAL | 0 refills | Status: AC

## 2021-01-22 NOTE — Progress Notes (Signed)
Medication Samples have been provided to the patient.  Drug name: Biktarvy        Strength: 50/200/25 mg       Qty: 7 tablets (1 bottles) LOT: CHYSVB   Exp.Date: 08/15/22  Dosing instructions: Take one tablet by mouth once daily  The patient has been instructed regarding the correct time, dose, and frequency of taking this medication, including desired effects and most common side effects.   Margarite Gouge, PharmD, CPP Clinical Pharmacist Practitioner Infectious Diseases Clinical Pharmacist [facility] for Infectious Disease

## 2021-01-27 ENCOUNTER — Encounter: Payer: Self-pay | Admitting: Internal Medicine

## 2021-02-03 ENCOUNTER — Ambulatory Visit (INDEPENDENT_AMBULATORY_CARE_PROVIDER_SITE_OTHER): Payer: Self-pay | Admitting: Nurse Practitioner

## 2021-02-03 ENCOUNTER — Other Ambulatory Visit: Payer: Self-pay

## 2021-02-03 ENCOUNTER — Encounter: Payer: Self-pay | Admitting: Nurse Practitioner

## 2021-02-03 VITALS — BP 156/93 | HR 84 | Temp 98.6°F | Wt 196.0 lb

## 2021-02-03 DIAGNOSIS — I10 Essential (primary) hypertension: Secondary | ICD-10-CM

## 2021-02-03 DIAGNOSIS — E119 Type 2 diabetes mellitus without complications: Secondary | ICD-10-CM

## 2021-02-03 DIAGNOSIS — Z125 Encounter for screening for malignant neoplasm of prostate: Secondary | ICD-10-CM

## 2021-02-03 DIAGNOSIS — E118 Type 2 diabetes mellitus with unspecified complications: Secondary | ICD-10-CM

## 2021-02-03 LAB — POCT URINALYSIS DIP (CLINITEK)
Bilirubin, UA: NEGATIVE
Glucose, UA: NEGATIVE mg/dL
Ketones, POC UA: NEGATIVE mg/dL
Leukocytes, UA: NEGATIVE
Nitrite, UA: NEGATIVE
POC PROTEIN,UA: >=300 — AB
Spec Grav, UA: >=1.03 — AB (ref 1.010–1.025)
Urobilinogen, UA: 0.2 E.U./dL
pH, UA: 6 (ref 5.0–8.0)

## 2021-02-03 LAB — POCT GLYCOSYLATED HEMOGLOBIN (HGB A1C)
HbA1c POC (<> result, manual entry): 6 % (ref 4.0–5.6)
HbA1c, POC (controlled diabetic range): 6 % (ref 0.0–7.0)
HbA1c, POC (prediabetic range): 6 % (ref 5.7–6.4)
Hemoglobin A1C: 6 % — AB (ref 4.0–5.6)

## 2021-02-03 MED ORDER — METFORMIN HCL 500 MG PO TABS: 500.0000 mg | ORAL_TABLET | Freq: Every day | ORAL | 3 refills | Status: DC

## 2021-02-03 MED ORDER — ATORVASTATIN CALCIUM 80 MG PO TABS: 80.0000 mg | ORAL_TABLET | Freq: Every day | ORAL | 3 refills | Status: DC

## 2021-02-03 MED ORDER — LOSARTAN POTASSIUM-HCTZ 50-12.5 MG PO TABS: 1.0000 | ORAL_TABLET | Freq: Every day | ORAL | 3 refills | Status: DC

## 2021-02-03 NOTE — Patient Instructions (Signed)

## 2021-02-03 NOTE — Progress Notes (Signed)
[place], [place]  [address] Phone:  [phone]   Fax:  [phone]   Established Patient Office Visit  Subjective:  Patient ID: Donald Wheeler, male    DOB: 10-Sep-1970  Age: 50 y.o. MRN: [id number]  CC:  Chief Complaint  Patient presents with   Follow-up    Pt is here follow up. Pt said he dropped form at front desk for work last week to be filled out for work    HPI [employer] presents for follow up. He has been lost to follow up for greater than one year. He  has a past medical history of Bell's palsy (09/07/2013), Diabetes mellitus without complication ([place]) (92/4268), HIV (human immunodeficiency virus infection) ([employer]), Hypertension, and Stroke ([place]) (04/09/2017).   He has hypertension. He was prescribed Amlodipine 10 mg and Hyzaar 50/12.5 mg. He has been taking Amlodipine 10 mg. He denies Denies headache, dizziness, visual changes, shortness of breath, dyspnea on exertion, chest pain, nausea, vomiting or any edema. He reports that he has never taken Hyzaar.   He reports that he does not have a glucose monitor. He can not afford to purchase another one. He is using the Levimr 25 units daily and metformin 500 mg daily. He denies feelings of hypoglycemia.  Past Medical History:  Diagnosis Date   Bell's palsy 09/07/2013   Diabetes mellitus without complication ([place]) 34/1962   NEW ONSET    HIV (human immunodeficiency virus infection) ([place])    dx'd 2000s   Hypertension    Stroke ([place]) 04/09/2017   vs TIA w/acute onset of right-sided weakness as well as trouble speaking/notes 04/09/2017    Past Surgical History:  Procedure Laterality Date   HEMORRHOID SURGERY      Family History  Problem Relation Age of Onset   Hypertension Mother    Diabetes Mother     Social History   Socioeconomic History   Marital status: Divorced    Spouse name: Not on file   Number of children: Not on file   Years of education: Not on file    Highest education level: Not on file  Occupational History   Not on file  Tobacco Use   Smoking status: Every Day    Packs/day: 0.20    Years: 15.00    Pack years: 3.00    Types: Cigars, Cigarettes    Last attempt to quit: 12/12/2017    Years since quitting: 3.1   Smokeless tobacco: Never   Tobacco comments:    black and mildsl daily  Vaping Use   Vaping Use: Never used  Substance and Sexual Activity   Alcohol use: Yes    Comment: 04/09/2017 "might have 2 beers/month; if that"   Drug use: Yes    Types: Marijuana   Sexual activity: Not Currently    Partners: Female    Birth control/protection: None, Condom    Comment: declined  condoms  Other Topics Concern   Not on file  Social History Narrative   Not on file   Social Determinants of Health   Financial Resource Strain: Not on file  Food Insecurity: Not on file  Transportation Needs: Not on file  Physical Activity: Not on file  Stress: Not on file  Social Connections: Not on file  Intimate Partner Violence: Not on file    Outpatient Medications Prior to Visit  Medication Sig Dispense Refill   ASPIRIN LOW DOSE 81 MG EC tablet TAKE 1 TABLET(81 MG) BY  MOUTH DAILY 100 tablet 3   bictegravir-emtricitabine-tenofovir AF (BIKTARVY) 50-200-25 MG TABS tablet Take 1 tablet by mouth daily. 30 tablet 11   insulin detemir (LEVEMIR FLEXTOUCH) 100 UNIT/ML FlexPen Administer 25units under the skin daily 15 mL 3   Insulin Pen Needle 32G X 4 MM MISC 1 each by Does not apply route as directed. 100 each 10   atorvastatin (LIPITOR) 80 MG tablet TAKE 1 TABLET BY MOUTH DAILY 30 tablet 11   metFORMIN (GLUCOPHAGE) 500 MG tablet Take 1 tablet (500 mg total) by mouth daily with breakfast. 60 tablet 1   amLODipine (NORVASC) 10 MG tablet TAKE 1 TABLET(10 MG) BY MOUTH DAILY (Patient not taking: Reported on 02/03/2021) 30 tablet 11   blood glucose meter kit and supplies KIT Dispense based on patient and insurance preference. Use up to four times daily  as directed. (FOR ICD-9 250.00, 250.01). 1 each 0   dicyclomine (BENTYL) 20 MG tablet Take 1 tablet (20 mg total) by mouth 2 (two) times daily. (Patient not taking: Reported on 02/03/2021) 20 tablet 0   FLUoxetine (PROZAC) 20 MG capsule Take 1 capsule (20 mg total) by mouth daily. (Patient not taking: Reported on 08/13/2020) 30 capsule 3   guaiFENesin-dextromethorphan (ROBITUSSIN DM) 100-10 MG/5ML syrup Take 5 mLs by mouth every 4 (four) hours as needed for cough. (Patient not taking: Reported on 08/13/2020) 118 mL 0   ondansetron (ZOFRAN ODT) 4 MG disintegrating tablet Take 1 tablet (4 mg total) by mouth every 8 (eight) hours as needed for nausea or vomiting. (Patient not taking: Reported on 02/03/2021) 20 tablet 0   losartan-hydrochlorothiazide (HYZAAR) 50-12.5 MG tablet Take 1 tablet by mouth daily. (Patient not taking: Reported on 02/03/2021) 90 tablet 3   No facility-administered medications prior to visit.    No Known Allergies  ROS Review of Systems    Objective:    Physical Exam HENT:     Head: Normocephalic and atraumatic.     Nose: Nose normal.     Mouth/Throat:     Mouth: Mucous membranes are moist.  Cardiovascular:     Rate and Rhythm: Normal rate and regular rhythm.     Pulses: Normal pulses.     Heart sounds: Normal heart sounds.  Abdominal:     Palpations: Abdomen is soft.     Comments: Increased abdominal girth Hypoactive   Musculoskeletal:        General: Normal range of motion.     Cervical back: Normal range of motion.     Right lower leg: No edema.     Left lower leg: No edema.  Skin:    General: Skin is warm and dry.     Capillary Refill: Capillary refill takes less than 2 seconds.  Neurological:     General: No focal deficit present.     Mental Status: He is alert and oriented to person, place, and time.  Psychiatric:        Mood and Affect: Mood normal.        Behavior: Behavior normal.        Thought Content: Thought content normal.         Judgment: Judgment normal.    BP (!) 156/93   Pulse 84   Temp 98.6 F (37 C)   Wt 196 lb (88.9 kg)   SpO2 100%   BMI 31.64 kg/m  Wt Readings from Last 3 Encounters:  02/03/21 196 lb (88.9 kg)  08/13/20 194 lb (88 kg)  07/15/20 191 lb (  86.6 kg)     There are no preventive care reminders to display for this patient.   There are no preventive care reminders to display for this patient.  No results found for: TSH Lab Results  Component Value Date   WBC 6.8 01/06/2021   HGB 14.7 01/06/2021   HCT 43.6 01/06/2021   MCV 87.2 01/06/2021   PLT 203 01/06/2021   Lab Results  Component Value Date   NA 138 01/06/2021   K 4.1 01/06/2021   CO2 27 01/06/2021   GLUCOSE 163 (H) 01/06/2021   BUN 11 01/06/2021   CREATININE 1.32 (H) 01/06/2021   BILITOT 0.8 01/06/2021   ALKPHOS 73 01/06/2021   AST 18 01/06/2021   ALT 18 01/06/2021   PROT 6.9 01/06/2021   ALBUMIN 3.5 01/06/2021   CALCIUM 8.8 (L) 01/06/2021   ANIONGAP 9 01/06/2021   Lab Results  Component Value Date   CHOL 110 07/15/2020   Lab Results  Component Value Date   HDL 31 (L) 07/15/2020   Lab Results  Component Value Date   LDLCALC 62 07/15/2020   Lab Results  Component Value Date   TRIG 91 07/15/2020   Lab Results  Component Value Date   CHOLHDL 3.5 07/15/2020   Lab Results  Component Value Date   HGBA1C 6.0 (A) 02/03/2021   HGBA1C 6.0 02/03/2021   HGBA1C 6.0 02/03/2021   HGBA1C 6.0 02/03/2021      Assessment & Plan:   Problem List Items Addressed This Visit       Cardiovascular and Mediastinum   HTN (hypertension) Started Hyzaar again Encouraged on going compliance with current medication regimen Encouraged home monitoring and recording BP <130/80 Eating a heart-healthy diet with less salt Encouraged regular physical activity  Recommend Weight loss     Relevant Medications   atorvastatin (LIPITOR) 80 MG tablet   losartan-hydrochlorothiazide (HYZAAR) 50-12.5 MG tablet     Endocrine    Other Visit Diagnoses     DM type 2, controlled, with complication ([place])    -  Primary Controlled Continue with current regimen.  No changes warranted.  Encourage compliance with current treatment regimen  Encourage regular CBG monitoring Encourage contacting office if excessive hyperglycemia and or hypoglycemia Lifestyle modification with healthy diet (fewer calories, more high fiber foods, whole grains and non-starchy vegetables, lower fat meat and fish, low-fat diary include healthy oils) regular exercise (physical activity) and weight loss Opthalmology exam discussed  Home BP monitoring also encouraged goal <130/80   Relevant Medications   metFORMIN (GLUCOPHAGE) 500 MG tablet   atorvastatin (LIPITOR) 80 MG tablet   losartan-hydrochlorothiazide (HYZAAR) 50-12.5 MG tablet   Other Relevant Orders   HgB A1c (Completed)   POCT URINALYSIS DIP (CLINITEK) (Completed)   Screening PSA (prostate specific antigen)           Meds ordered this encounter  Medications   metFORMIN (GLUCOPHAGE) 500 MG tablet    Sig: Take 1 tablet (500 mg total) by mouth daily with breakfast.    Dispense:  90 tablet    Refill:  3   atorvastatin (LIPITOR) 80 MG tablet    Sig: Take 1 tablet (80 mg total) by mouth daily. TAKE 1 TABLET BY MOUTH DAILY    Dispense:  90 tablet    Refill:  3   losartan-hydrochlorothiazide (HYZAAR) 50-12.5 MG tablet    Sig: Take 1 tablet by mouth daily.    Dispense:  90 tablet    Refill:  3    Order  Specific Question:   Supervising Provider    Answer:   Tresa Garter [[id number]]     Follow-up: Return in about 6 weeks (around 03/17/2021) for Follow up HTN [id number].    Vevelyn Francois, NP

## 2021-02-04 LAB — PSA: Prostate Specific Ag, Serum: 7.6 ng/mL — ABNORMAL HIGH (ref 0.0–4.0)

## 2021-02-05 ENCOUNTER — Encounter: Payer: Self-pay | Admitting: Nurse Practitioner

## 2021-02-05 ENCOUNTER — Other Ambulatory Visit: Payer: Self-pay | Admitting: Nurse Practitioner

## 2021-02-05 DIAGNOSIS — R3129 Other microscopic hematuria: Secondary | ICD-10-CM

## 2021-02-05 DIAGNOSIS — R972 Elevated prostate specific antigen [PSA]: Secondary | ICD-10-CM

## 2021-02-12 ENCOUNTER — Other Ambulatory Visit: Payer: Self-pay

## 2021-02-12 ENCOUNTER — Ambulatory Visit: Payer: Self-pay | Admitting: Nurse Practitioner

## 2021-02-12 VITALS — BP 158/99 | HR 80

## 2021-02-12 DIAGNOSIS — I10 Essential (primary) hypertension: Secondary | ICD-10-CM

## 2021-02-12 NOTE — Progress Notes (Signed)
Patient in for blood pressure check. Per Thad Ranger, NP patient blood pressure still elevated will need to reevaluate. Patient scheduled for Monday 9:30 am.

## 2021-02-17 ENCOUNTER — Ambulatory Visit: Payer: Self-pay | Admitting: Nurse Practitioner

## 2021-02-17 ENCOUNTER — Other Ambulatory Visit: Payer: Self-pay

## 2021-02-17 ENCOUNTER — Ambulatory Visit: Payer: Self-pay | Admitting: Internal Medicine

## 2021-02-17 VITALS — BP 153/95 | HR 83

## 2021-02-17 DIAGNOSIS — I10 Essential (primary) hypertension: Secondary | ICD-10-CM

## 2021-02-17 MED ORDER — LOSARTAN POTASSIUM-HCTZ 50-12.5 MG PO TABS
1.0000 | ORAL_TABLET | Freq: Every day | ORAL | 3 refills | Status: DC
Start: 2021-02-17 — End: 2022-03-25
  Filled 2021-02-17: qty 30, 30d supply, fill #0
  Filled 2021-03-18: qty 30, 30d supply, fill #1
  Filled 2021-04-17: qty 30, 30d supply, fill #2
  Filled 2021-04-17: qty 30, 30d supply, fill #0
  Filled 2021-05-19: qty 30, 30d supply, fill #1
  Filled 2021-06-25: qty 30, 30d supply, fill #2
  Filled 2021-07-24: qty 30, 30d supply, fill #3
  Filled 2021-08-28: qty 30, 30d supply, fill #4
  Filled 2021-09-25: qty 30, 30d supply, fill #5
  Filled 2021-10-29: qty 30, 30d supply, fill #6
  Filled 2021-12-01: qty 30, 30d supply, fill #7
  Filled 2022-01-06: qty 30, 30d supply, fill #8
  Filled 2022-02-13: qty 30, 30d supply, fill #9

## 2021-02-17 NOTE — Progress Notes (Signed)
Patient in for blood pressure check.  

## 2021-02-18 ENCOUNTER — Ambulatory Visit: Payer: Self-pay | Admitting: Internal Medicine

## 2021-02-19 ENCOUNTER — Other Ambulatory Visit: Payer: Self-pay

## 2021-02-19 ENCOUNTER — Ambulatory Visit (INDEPENDENT_AMBULATORY_CARE_PROVIDER_SITE_OTHER): Payer: Self-pay | Admitting: Family

## 2021-02-19 ENCOUNTER — Encounter: Payer: Self-pay | Admitting: Family

## 2021-02-19 VITALS — BP 143/89 | HR 74 | Temp 98.0°F | Wt 194.0 lb

## 2021-02-19 DIAGNOSIS — Z0000 Encounter for general adult medical examination without abnormal findings: Secondary | ICD-10-CM

## 2021-02-19 DIAGNOSIS — Z113 Encounter for screening for infections with a predominantly sexual mode of transmission: Secondary | ICD-10-CM

## 2021-02-19 DIAGNOSIS — B20 Human immunodeficiency virus [HIV] disease: Secondary | ICD-10-CM

## 2021-02-19 NOTE — Progress Notes (Signed)
  Brief Narrative   Patient ID: Donald Wheeler, male    DOB: 09/07/1970, 50 y.o.   MRN: [id number]  [name] is a 50 y/o AA gentleman diagnosed with HIV disease in October 2013 with risk factor of heterosexual contact. Initial CD4 count of 180 with viral load of 45,000. Genotype with no medication resistance mutations. [id number] negative. No history of opportunistic infection. Previous ART experience with Stribild, Genvoya and now Biktarvy.   Subjective:    Chief Complaint  Patient presents with   Follow-up    HPI:  Donald Wheeler is a 50 y.o. male with HIV disease last seen on 08/13/2020 with well-controlled virus and good adherence and tolerance to his ART regimen of Biktarvy.  Viral load was undetectable and CD4 count was 454.  RPR was nonreactive for syphilis.  Here today for routine follow-up.  [name] continues to take his Biktarvy daily as prescribed with no adverse side effects.  Overall feeling well today with no new concerns/complaints. Denies fevers, chills, night sweats, headaches, changes in vision, neck pain/stiffness, nausea, diarrhea, vomiting, lesions or rashes.  [name] has no problems obtaining medication from the pharmacy remains covered by [employer].  Denies feelings of being down, depressed, or hopeless recently.  No current recreational or illicit drug use with occasional alcohol consumption continued cigar use on occasion.  Healthcare maintenance due includes influenza vaccine, Prevnar, Menveo and routine dental care. Asking about prostate cancer screening. Due for colon cancer screening. Condoms offered and declined.   No Known Allergies    Outpatient Medications Prior to Visit  Medication Sig Dispense Refill   ASPIRIN LOW DOSE 81 MG EC tablet TAKE 1 TABLET(81 MG) BY MOUTH DAILY 100 tablet 3   atorvastatin (LIPITOR) 80 MG tablet Take 1 tablet (80 mg total) by mouth daily. TAKE 1 TABLET BY MOUTH DAILY 90 tablet 3   bictegravir-emtricitabine-tenofovir AF (BIKTARVY)  50-200-25 MG TABS tablet Take 1 tablet by mouth daily. 30 tablet 11   blood glucose meter kit and supplies KIT Dispense based on patient and insurance preference. Use up to four times daily as directed. (FOR ICD-9 250.00, 250.01). 1 each 0   insulin detemir (LEVEMIR FLEXTOUCH) 100 UNIT/ML FlexPen Administer 25units under the skin daily 15 mL 3   Insulin Pen Needle 32G X 4 MM MISC 1 each by Does not apply route as directed. 100 each 10   losartan-hydrochlorothiazide (HYZAAR) 50-12.5 MG tablet Take 1 tablet by mouth daily. 90 tablet 3   metFORMIN (GLUCOPHAGE) 500 MG tablet Take 1 tablet (500 mg total) by mouth daily with breakfast. 90 tablet 3   amLODipine (NORVASC) 10 MG tablet TAKE 1 TABLET(10 MG) BY MOUTH DAILY (Patient not taking: Reported on 02/19/2021) 30 tablet 11   dicyclomine (BENTYL) 20 MG tablet Take 1 tablet (20 mg total) by mouth 2 (two) times daily. (Patient not taking: Reported on 02/03/2021) 20 tablet 0   FLUoxetine (PROZAC) 20 MG capsule Take 1 capsule (20 mg total) by mouth daily. (Patient not taking: Reported on 08/13/2020) 30 capsule 3   guaiFENesin-dextromethorphan (ROBITUSSIN DM) 100-10 MG/5ML syrup Take 5 mLs by mouth every 4 (four) hours as needed for cough. (Patient not taking: Reported on 08/13/2020) 118 mL 0   ondansetron (ZOFRAN ODT) 4 MG disintegrating tablet Take 1 tablet (4 mg total) by mouth every 8 (eight) hours as needed for nausea or vomiting. (Patient not taking: Reported on 02/03/2021) 20 tablet 0   No facility-administered medications prior to visit.       Past Medical History:  Diagnosis Date   Bell's palsy 09/07/2013   Diabetes mellitus without complication ([facility]) 12/2017   NEW ONSET    HIV (human immunodeficiency virus infection) ([facility])    dx'd 2000s   Hypertension    Stroke ([facility]) 04/09/2017   vs TIA w/acute onset of right-sided weakness as well as trouble speaking/notes 04/09/2017     Past Surgical History:  Procedure Laterality Date   HEMORRHOID SURGERY         Review of Systems  Constitutional:  Negative for appetite change, chills, fatigue, fever and unexpected weight change.  Eyes:  Negative for visual disturbance.  Respiratory:  Negative for cough, chest tightness, shortness of breath and wheezing.   Cardiovascular:  Negative for chest pain and leg swelling.  Gastrointestinal:  Negative for abdominal pain, constipation, diarrhea, nausea and vomiting.  Genitourinary:  Negative for dysuria, flank pain, frequency, genital sores, hematuria and urgency.  Skin:  Negative for rash.  Allergic/Immunologic: Negative for immunocompromised state.  Neurological:  Negative for dizziness and headaches.     Objective:    BP (!) 143/89   Pulse 74   Temp 98 F (36.7 C) (Oral)   Wt 194 lb (88 kg)   SpO2 98%   BMI 31.31 kg/m  Nursing note and vital signs reviewed.  Physical Exam Constitutional:      General: He is not in acute distress.    Appearance: He is well-developed.  Eyes:     Conjunctiva/sclera: Conjunctivae normal.  Cardiovascular:     Rate and Rhythm: Normal rate and regular rhythm.     Heart sounds: Normal heart sounds. No murmur heard.   No friction rub. No gallop.  Pulmonary:     Effort: Pulmonary effort is normal. No respiratory distress.     Breath sounds: Normal breath sounds. No wheezing or rales.  Chest:     Chest wall: No tenderness.  Abdominal:     General: Bowel sounds are normal.     Palpations: Abdomen is soft.     Tenderness: There is no abdominal tenderness.  Musculoskeletal:     Cervical back: Neck supple.  Lymphadenopathy:     Cervical: No cervical adenopathy.  Skin:    General: Skin is warm and dry.     Findings: No rash.  Neurological:     Mental Status: He is alert and oriented to person, place, and time.  Psychiatric:        Behavior: Behavior normal.        Thought Content: Thought content normal.        Judgment: Judgment normal.     Depression screen PHQ 2/9 02/19/2021 02/03/2021  08/13/2020 01/01/2020 08/23/2019  Decreased Interest 0 0 0 0 0  Down, Depressed, Hopeless 0 1 1 1 0  PHQ - 2 Score 0 1 1 1 0  Altered sleeping - - - - -  Tired, decreased energy - - - - -  Change in appetite - - - - -  Feeling bad or failure about yourself  - - - - -  Trouble concentrating - - - - -  Moving slowly or fidgety/restless - - - - -  Suicidal thoughts - - - - -  PHQ-9 Score - - - - -  Difficult doing work/chores - - - - -       Assessment & Plan:    Patient Active Problem List   Diagnosis Date Noted   Healthcare maintenance 02/19/2021   PSA elevation 02/05/2021     Persistent microscopic hematuria 02/05/2021   Controlled type 2 diabetes mellitus with microalbuminuria, with long-term current use of insulin ([place]) 08/09/2019   Vitamin D deficiency 05/10/2019   Shingles 07/2018   Hyperosmolar non-ketotic state in patient with type 2 diabetes mellitus ([place]) 12/23/2017   Diabetes mellitus, new onset ([place]) 12/23/2017   History of hematuria 08/16/2017   Mixed hyperlipidemia 06/16/2017   Cerebral thrombosis with cerebral infarction 04/10/2017   Solitary pulmonary nodule 04/10/2017   Focal neurological deficit 04/09/2017   CKD (chronic kidney disease) stage 3, GFR 30-59 ml/min 04/09/2017   Smoker 04/09/2017   HTN (hypertension), benign 09/07/2013   HIV (human immunodeficiency virus infection) ([place]) 03/17/2012     Problem List Items Addressed This Visit       Other   HIV (human immunodeficiency virus infection) ([place]) - Primary    [name] continues to have well-controlled virus with good adherence and tolerance to his ART regimen of Biktarvy.  No signs/symptoms of opportunistic infection.  We reviewed previous lab work and discussed plan of care.  Continue current dose of Biktarvy.  Check blood work today.  He will need to renew financial assistance after January 1.  Plan for follow-up in 2 months or sooner if needed with Dr. Baxter Flattery.       Relevant Orders   COMPLETE  METABOLIC PANEL WITH GFR   T-helper cell (CD4)- ([facility] clinic only)   HIV-1 RNA quant-no reflex-bld   Healthcare maintenance    Discussed importance of safe sexual practices and condom use.  Condoms offered. Declines vaccines. Due for routine dental care with referral placed to [facility] Recommended follow-up discussion regarding colon cancer screening and prostate cancer screening with PCP.      Other Visit Diagnoses     Screening for STDs (sexually transmitted diseases)       Relevant Orders   RPR        I am having Laurel Callas maintain his blood glucose meter kit and supplies, Insulin Pen Needle, Biktarvy, guaiFENesin-dextromethorphan, FLUoxetine, amLODipine, Levemir FlexTouch, Aspirin Low Dose, ondansetron, dicyclomine, metFORMIN, atorvastatin, and losartan-hydrochlorothiazide.   Follow-up: Return in about 2 months (around 04/22/2021), or if symptoms worsen or fail to improve.   Terri Piedra, MSN, FNP-C Nurse Practitioner [facility] for Infectious Disease [place] number: [phone]

## 2021-02-19 NOTE — Assessment & Plan Note (Signed)
   Discussed importance of safe sexual practices and condom use.  Condoms offered.  Declines vaccines.  Due for routine dental care with referral placed to [facility]  Recommended follow-up discussion regarding colon cancer screening and prostate cancer screening with PCP.

## 2021-02-19 NOTE — Patient Instructions (Addendum)
Nice to see you.  We will check your lab work today.  Please call [facility]) to schedule/follow up on your dental care at [phone]  Continue to take your medication daily as prescribed.  Plan for follow up with Dr. Drue Second in 2 month or sooner if needed with lab work on the same day.  Have a great day and stay safe!

## 2021-02-19 NOTE — Assessment & Plan Note (Signed)
Mr. Mandigo continues to have well-controlled virus with good adherence and tolerance to his ART regimen of Biktarvy.  No signs/symptoms of opportunistic infection.  We reviewed previous lab work and discussed plan of care.  Continue current dose of Biktarvy.  Check blood work today.  He will need to renew financial assistance after January 1.  Plan for follow-up in 2 months or sooner if needed with Dr. Drue Second.

## 2021-02-20 LAB — T-HELPER CELL (CD4) - (RCID CLINIC ONLY)
CD4 % Helper T Cell: 21 % — ABNORMAL LOW (ref 33–65)
CD4 T Cell Abs: 554 /uL (ref 400–1790)

## 2021-02-21 LAB — HIV-1 RNA QUANT-NO REFLEX-BLD
HIV 1 RNA Quant: NOT DETECTED Copies/mL
HIV-1 RNA Quant, Log: NOT DETECTED Log cps/mL

## 2021-02-21 LAB — COMPLETE METABOLIC PANEL WITH GFR
AG Ratio: 1.3 (calc) (ref 1.0–2.5)
ALT: 13 U/L (ref 9–46)
AST: 16 U/L (ref 10–35)
Albumin: 3.8 g/dL (ref 3.6–5.1)
Alkaline phosphatase (APISO): 69 U/L (ref 35–144)
BUN/Creatinine Ratio: 11 (calc) (ref 6–22)
BUN: 18 mg/dL (ref 7–25)
CO2: 27 mmol/L (ref 20–32)
Calcium: 8.6 mg/dL (ref 8.6–10.3)
Chloride: 107 mmol/L (ref 98–110)
Creat: 1.7 mg/dL — ABNORMAL HIGH (ref 0.70–1.30)
Globulin: 2.9 g/dL (calc) (ref 1.9–3.7)
Glucose, Bld: 94 mg/dL (ref 65–99)
Potassium: 4 mmol/L (ref 3.5–5.3)
Sodium: 143 mmol/L (ref 135–146)
Total Bilirubin: 0.5 mg/dL (ref 0.2–1.2)
Total Protein: 6.7 g/dL (ref 6.1–8.1)
eGFR: 48 mL/min/{1.73_m2} — ABNORMAL LOW (ref >=60)

## 2021-02-21 LAB — RPR: RPR Ser Ql: NONREACTIVE

## 2021-02-24 ENCOUNTER — Ambulatory Visit: Payer: Self-pay | Admitting: Nurse Practitioner

## 2021-02-24 ENCOUNTER — Other Ambulatory Visit: Payer: Self-pay

## 2021-02-24 VITALS — BP 145/87 | HR 68

## 2021-02-24 DIAGNOSIS — I10 Essential (primary) hypertension: Secondary | ICD-10-CM

## 2021-03-12 ENCOUNTER — Other Ambulatory Visit: Payer: Self-pay | Admitting: Nurse Practitioner

## 2021-03-12 ENCOUNTER — Other Ambulatory Visit: Payer: Self-pay

## 2021-03-17 ENCOUNTER — Other Ambulatory Visit: Payer: Self-pay

## 2021-03-18 ENCOUNTER — Other Ambulatory Visit: Payer: Self-pay | Admitting: Nurse Practitioner

## 2021-03-18 ENCOUNTER — Other Ambulatory Visit: Payer: Self-pay

## 2021-03-20 ENCOUNTER — Ambulatory Visit (INDEPENDENT_AMBULATORY_CARE_PROVIDER_SITE_OTHER): Payer: Self-pay | Admitting: Nurse Practitioner

## 2021-03-20 ENCOUNTER — Other Ambulatory Visit: Payer: Self-pay

## 2021-03-20 ENCOUNTER — Encounter: Payer: Self-pay | Admitting: Nurse Practitioner

## 2021-03-20 VITALS — BP 152/85 | HR 82 | Temp 98.6°F | Ht 66.0 in | Wt 192.2 lb

## 2021-03-20 DIAGNOSIS — E1165 Type 2 diabetes mellitus with hyperglycemia: Secondary | ICD-10-CM

## 2021-03-20 DIAGNOSIS — F17200 Nicotine dependence, unspecified, uncomplicated: Secondary | ICD-10-CM

## 2021-03-20 DIAGNOSIS — I10 Essential (primary) hypertension: Secondary | ICD-10-CM

## 2021-03-20 MED ORDER — BLOOD GLUCOSE MONITOR KIT: PACK | 0 refills | Status: DC

## 2021-03-20 NOTE — Patient Instructions (Signed)
Managing Your Hypertension Hypertension, also called high blood pressure, is when the force of the blood pressing against the walls of the arteries is too strong. Arteries are blood vessels that carry blood from your heart throughout your body. Hypertension forces the heart to work harder to pump blood and may cause the arteries to become narrow or stiff. Understanding blood pressure readings Your personal target blood pressure may vary depending on your medical conditions, your age, and other factors. A blood pressure reading includes a higher number over a lower number. Ideally, your blood pressure should be below 120/80. You should know that: The first, or top, number is called the systolic pressure. It is a measure of the pressure in your arteries as your heart beats. The second, or bottom number, is called the diastolic pressure. It is a measure of the pressure in your arteries as the heart relaxes. Blood pressure is classified into four stages. Based on your blood pressure reading, your health care provider may use the following stages to determine what type of treatment you need, if any. Systolic pressure and diastolic pressure are measured in a unit called mmHg. Normal Systolic pressure: below 120. Diastolic pressure: below 80. Elevated Systolic pressure: 120-129. Diastolic pressure: below 80. Hypertension stage 1 Systolic pressure: 130-139. Diastolic pressure: 80-89. Hypertension stage 2 Systolic pressure: 140 or above. Diastolic pressure: 90 or above. How can this condition affect me? Managing your hypertension is an important responsibility. Over time, hypertension can damage the arteries and decrease blood flow to important parts of the body, including the brain, heart, and kidneys. Having untreated or uncontrolled hypertension can lead to: A heart attack. A stroke. A weakened blood vessel (aneurysm). Heart failure. Kidney damage. Eye damage. Metabolic syndrome. Memory and  concentration problems. Vascular dementia. What actions can I take to manage this condition? Hypertension can be managed by making lifestyle changes and possibly by taking medicines. Your health care provider will help you make a plan to bring your blood pressure within a normal range. Nutrition  Eat a diet that is high in fiber and potassium, and low in salt (sodium), added sugar, and fat. An example eating plan is called the Dietary Approaches to Stop Hypertension (DASH) diet. To eat this way: Eat plenty of fresh fruits and vegetables. Try to fill one-half of your plate at each meal with fruits and vegetables. Eat whole grains, such as whole-wheat pasta, brown rice, or whole-grain bread. Fill about one-fourth of your plate with whole grains. Eat low-fat dairy products. Avoid fatty cuts of meat, processed or cured meats, and poultry with skin. Fill about one-fourth of your plate with lean proteins such as fish, chicken without skin, beans, eggs, and tofu. Avoid pre-made and processed foods. These tend to be higher in sodium, added sugar, and fat. Reduce your daily sodium intake. Most people with hypertension should eat less than 1,500 mg of sodium a day. Lifestyle  Work with your health care provider to maintain a healthy body weight or to lose weight. Ask what an ideal weight is for you. Get at least 30 minutes of exercise that causes your heart to beat faster (aerobic exercise) most days of the week. Activities may include walking, swimming, or biking. Include exercise to strengthen your muscles (resistance exercise), such as weight lifting, as part of your weekly exercise routine. Try to do these types of exercises for 30 minutes at least 3 days a week. Do not use any products that contain nicotine or tobacco, such as cigarettes, e-cigarettes,   and chewing tobacco. If you need help quitting, ask your health care provider. Control any long-term (chronic) conditions you have, such as high  cholesterol or diabetes. Identify your sources of stress and find ways to manage stress. This may include meditation, deep breathing, or making time for fun activities. Alcohol use Do not drink alcohol if: Your health care provider tells you not to drink. You are pregnant, may be pregnant, or are planning to become pregnant. If you drink alcohol: Limit how much you use to: 0-1 drink a day for women. 0-2 drinks a day for men. Be aware of how much alcohol is in your drink. In the [place] one drink equals one 12 oz bottle of beer (355 mL), one 5 oz glass of wine (148 mL), or one 1 oz glass of hard liquor (44 mL). Medicines Your health care provider may prescribe medicine if lifestyle changes are not enough to get your blood pressure under control and if: Your systolic blood pressure is 130 or higher. Your diastolic blood pressure is 80 or higher. Take medicines only as told by your health care provider. Follow the directions carefully. Blood pressure medicines must be taken as told by your health care provider. The medicine does not work as well when you skip doses. Skipping doses also puts you at risk for problems. Monitoring Before you monitor your blood pressure: Do not smoke, drink caffeinated beverages, or exercise within 30 minutes before taking a measurement. Use the bathroom and empty your bladder (urinate). Sit quietly for at least 5 minutes before taking measurements. Monitor your blood pressure at home as told by your health care provider. To do this: Sit with your back straight and supported. Place your feet flat on the floor. Do not cross your legs. Support your arm on a flat surface, such as a table. Make sure your upper arm is at heart level. Each time you measure, take two or three readings one minute apart and record the results. You may also need to have your blood pressure checked regularly by your health care provider. General information Talk with your health care  provider about your diet, exercise habits, and other lifestyle factors that may be contributing to hypertension. Review all the medicines you take with your health care provider because there may be side effects or interactions. Keep all visits as told by your health care provider. Your health care provider can help you create and adjust your plan for managing your high blood pressure. Where to find more information National Heart, Lung, and Blood Institute: www.nhlbi.nih.gov American Heart Association: www.heart.org Contact a health care provider if: You think you are having a reaction to medicines you have taken. You have repeated (recurrent) headaches. You feel dizzy. You have swelling in your ankles. You have trouble with your vision. Get help right away if: You develop a severe headache or confusion. You have unusual weakness or numbness, or you feel faint. You have severe pain in your chest or abdomen. You vomit repeatedly. You have trouble breathing. These symptoms may represent a serious problem that is an emergency. Do not wait to see if the symptoms will go away. Get medical help right away. Call your local emergency services (911 in the [place] Do not drive yourself to the hospital. Summary Hypertension is when the force of blood pumping through your arteries is too strong. If this condition is not controlled, it may put you at risk for serious complications. Your personal target blood pressure may vary depending on   your medical conditions, your age, and other factors. For most people, a normal blood pressure is less than 120/80. Hypertension is managed by lifestyle changes, medicines, or both. Lifestyle changes to help manage hypertension include losing weight, eating a healthy, low-sodium diet, exercising more, stopping smoking, and limiting alcohol. This information is not intended to replace advice given to you by your health care provider. Make sure you discuss any questions  you have with your health care provider. Document Revised: 03/20/2019 Document Reviewed: 01/31/2019 Elsevier Patient Education  2022 Elsevier Inc.  

## 2021-03-20 NOTE — Progress Notes (Signed)
[name], [name]  [address] Phone:  [phone]   Fax:  [phone]   Established Patient Office Visit  Subjective:  Patient ID: Donald Wheeler, male    DOB: 10-28-1970  Age: 51 y.o. MRN: [id number]  CC:  Chief Complaint  Patient presents with   Follow-up    Pt is here today for his 6 week follow up.  No concerns or issues today.    HPI Donald Wheeler presents for follow up. He  has a past medical history of Bell's palsy (09/07/2013), Diabetes mellitus without complication ([place]) (09/6224), HIV (human immunodeficiency virus infection) ([place]), Hypertension, and Stroke ([place]) (04/09/2017).   He is in today for a BP follow up. He is prescribed Amlodipine 10 and losartan-HCTZ. He is compliant. He has not taken his BP medication today. He takes it in the afternoons . Denies headache, dizziness, visual changes, shortness of breath, dyspnea on exertion, chest pain, nausea, vomiting or any edema.    Past Medical History:  Diagnosis Date   Bell's palsy 09/07/2013   Diabetes mellitus without complication ([place]) 33/3545   NEW ONSET    HIV (human immunodeficiency virus infection) ([place])    dx'd 2000s   Hypertension    Stroke ([place]) 04/09/2017   vs TIA w/acute onset of right-sided weakness as well as trouble speaking/notes 04/09/2017    Past Surgical History:  Procedure Laterality Date   HEMORRHOID SURGERY      Family History  Problem Relation Age of Onset   Hypertension Mother    Diabetes Mother     Social History   Socioeconomic History   Marital status: Divorced    Spouse name: Not on file   Number of children: Not on file   Years of education: Not on file   Highest education level: Not on file  Occupational History   Not on file  Tobacco Use   Smoking status: Some Days    Packs/day: 0.20    Years: 15.00    Pack years: 3.00    Types: Cigars, Cigarettes    Last attempt to quit: 12/12/2017    Years since quitting: 3.2    Smokeless tobacco: Never   Tobacco comments:    black and mildsl daily  Vaping Use   Vaping Use: Never used  Substance and Sexual Activity   Alcohol use: Yes    Comment: 04/09/2017 "might have 2 beers/month; if that"   Drug use: Not Currently    Types: Marijuana   Sexual activity: Not Currently    Partners: Female    Birth control/protection: None, Condom    Comment: declined  condoms  Other Topics Concern   Not on file  Social History Narrative   Not on file   Social Determinants of Health   Financial Resource Strain: Not on file  Food Insecurity: Not on file  Transportation Needs: Not on file  Physical Activity: Not on file  Stress: Not on file  Social Connections: Not on file  Intimate Partner Violence: Not on file    Outpatient Medications Prior to Visit  Medication Sig Dispense Refill   ASPIRIN LOW DOSE 81 MG EC tablet TAKE 1 TABLET(81 MG) BY MOUTH DAILY 100 tablet 3   atorvastatin (LIPITOR) 80 MG tablet Take 1 tablet (80 mg total) by mouth daily. TAKE 1 TABLET BY MOUTH DAILY 90 tablet 3   bictegravir-emtricitabine-tenofovir AF (BIKTARVY) 50-200-25 MG TABS tablet Take 1 tablet by mouth daily. 30 tablet 11  guaiFENesin-dextromethorphan (ROBITUSSIN DM) 100-10 MG/5ML syrup Take 5 mLs by mouth every 4 (four) hours as needed for cough. 118 mL 0   insulin detemir (LEVEMIR FLEXTOUCH) 100 UNIT/ML FlexPen Administer 25units under the skin daily 15 mL 3   Insulin Pen Needle 32G X 4 MM MISC 1 each by Does not apply route as directed. 100 each 10   losartan-hydrochlorothiazide (HYZAAR) 50-12.5 MG tablet Take 1 tablet by mouth daily. 90 tablet 3   metFORMIN (GLUCOPHAGE) 500 MG tablet Take 1 tablet (500 mg total) by mouth daily with breakfast. 90 tablet 3   blood glucose meter kit and supplies KIT Dispense based on patient and insurance preference. Use up to four times daily as directed. (FOR ICD-9 250.00, 250.01). 1 each 0   amLODipine (NORVASC) 10 MG tablet TAKE 1 TABLET(10 MG) BY  MOUTH DAILY (Patient not taking: Reported on 02/19/2021) 30 tablet 11   dicyclomine (BENTYL) 20 MG tablet Take 1 tablet (20 mg total) by mouth 2 (two) times daily. (Patient not taking: Reported on 02/03/2021) 20 tablet 0   FLUoxetine (PROZAC) 20 MG capsule Take 1 capsule (20 mg total) by mouth daily. (Patient not taking: Reported on 08/13/2020) 30 capsule 3   ondansetron (ZOFRAN ODT) 4 MG disintegrating tablet Take 1 tablet (4 mg total) by mouth every 8 (eight) hours as needed for nausea or vomiting. (Patient not taking: Reported on 02/03/2021) 20 tablet 0   No facility-administered medications prior to visit.    No Known Allergies  ROS Review of Systems    Objective:    Physical Exam HENT:     Head: Normocephalic and atraumatic.  Cardiovascular:     Rate and Rhythm: Normal rate.     Pulses: Normal pulses.     Heart sounds: Normal heart sounds.  Pulmonary:     Effort: Pulmonary effort is normal.     Breath sounds: Normal breath sounds.  Musculoskeletal:     Cervical back: Normal range of motion.  Skin:    General: Skin is warm and dry.     Capillary Refill: Capillary refill takes less than 2 seconds.  Neurological:     General: No focal deficit present.     Mental Status: He is alert and oriented to person, place, and time.  Psychiatric:        Mood and Affect: Mood normal.        Behavior: Behavior normal.    BP (!) 152/85    Pulse 82    Temp 98.6 F (37 C)    Ht 5' 6"  (1.676 m)    Wt 192 lb 3.2 oz (87.2 kg)    SpO2 98%    BMI 31.02 kg/m  Wt Readings from Last 3 Encounters:  03/20/21 192 lb 3.2 oz (87.2 kg)  02/19/21 194 lb (88 kg)  02/03/21 196 lb (88.9 kg)     Health Maintenance Due  Topic Date Due   OPHTHALMOLOGY EXAM  Never done   COVID-19 Vaccine (4 - Booster for Pfizer series) 03/01/2020    There are no preventive care reminders to display for this patient.  No results found for: TSH Lab Results  Component Value Date   WBC 6.8 01/06/2021   HGB 14.7  01/06/2021   HCT 43.6 01/06/2021   MCV 87.2 01/06/2021   PLT 203 01/06/2021   Lab Results  Component Value Date   NA 143 02/19/2021   K 4.0 02/19/2021   CO2 27 02/19/2021   GLUCOSE 94 02/19/2021  BUN 18 02/19/2021   CREATININE 1.70 (H) 02/19/2021   BILITOT 0.5 02/19/2021   ALKPHOS 73 01/06/2021   AST 16 02/19/2021   ALT 13 02/19/2021   PROT 6.7 02/19/2021   ALBUMIN 3.5 01/06/2021   CALCIUM 8.6 02/19/2021   ANIONGAP 9 01/06/2021   EGFR 48 (L) 02/19/2021   Lab Results  Component Value Date   CHOL 110 07/15/2020   Lab Results  Component Value Date   HDL 31 (L) 07/15/2020   Lab Results  Component Value Date   LDLCALC 62 07/15/2020   Lab Results  Component Value Date   TRIG 91 07/15/2020   Lab Results  Component Value Date   CHOLHDL 3.5 07/15/2020   Lab Results  Component Value Date   HGBA1C 6.0 (A) 02/03/2021   HGBA1C 6.0 02/03/2021   HGBA1C 6.0 02/03/2021   HGBA1C 6.0 02/03/2021      Assessment & Plan:   Problem List Items Addressed This Visit       Cardiovascular and Mediastinum   HTN (hypertension), benign - Primary Persistent but stable  Encouraged on going compliance with current medication regimen Encouraged home monitoring and recording BP <130/80 Eating a heart-healthy diet with less salt and smoking cessation Encouraged regular physical activity  Recommend Weight loss       Other   Smoker Discussed the risk factors associated with smoking ; CAD, COPD, Cancer, PVD increased susceptibility to respiratory illnesses Discussed treatment options with cessation ie counseling, support resources and available medications  Choosing a quit day and setting goals accordingly. Discussed ways to quit; start by decreasing one cigarette per day or per week.  Encourage patient to call for assistance once ready to quit. Provided education handouts   Counseling 5-10 minutes    Other Visit Diagnoses     Uncontrolled type 2 diabetes mellitus with  hyperglycemia ([place])    Encourage compliance with current treatment regimen   Encourage regular CBG monitoring Encourage contacting office if excessive hyperglycemia and or hypoglycemia Lifestyle modification with healthy diet (fewer calories, more high fiber foods, whole grains and non-starchy vegetables, lower fat meat and fish, low-fat diary include healthy oils) regular exercise (physical activity) and weight loss Opthalmology exam discussed  Nutritional consult recommended Regular dental visits encouraged Home BP monitoring also encouraged goal <130/80         Meds ordered this encounter  Medications   blood glucose meter kit and supplies KIT    Sig: Dispense based on patient and insurance preference. Use up to four times daily as directed.    Dispense:  1 each    Refill:  0    Order Specific Question:   Supervising Provider    Answer:   Tresa Garter [id number]    Order Specific Question:   Number of strips    Answer:   100    Order Specific Question:   Number of lancets    Answer:   100    Follow-up: Return in about 3 months (around 06/18/2021) for Follow up HTN [id number], follow up [id number].    Vevelyn Francois, NP

## 2021-04-09 ENCOUNTER — Other Ambulatory Visit: Payer: Self-pay

## 2021-04-09 ENCOUNTER — Emergency Department (HOSPITAL_COMMUNITY): Admission: EM | Admit: 2021-04-09 | Discharge: 2021-04-09 | Discharge status: Against Medical Advice | Payer: Self-pay

## 2021-04-09 ENCOUNTER — Ambulatory Visit: Payer: Self-pay | Admitting: Nurse Practitioner

## 2021-04-09 VITALS — BP 155/100 | HR 69 | Resp 16

## 2021-04-09 DIAGNOSIS — I10 Essential (primary) hypertension: Secondary | ICD-10-CM

## 2021-04-09 NOTE — ED Notes (Signed)
Called pt 3x no response °

## 2021-04-09 NOTE — ED Notes (Signed)
No answer in lobby.

## 2021-04-09 NOTE — ED Notes (Signed)
Patient decided to leave.   

## 2021-04-17 ENCOUNTER — Other Ambulatory Visit: Payer: Self-pay

## 2021-04-18 ENCOUNTER — Other Ambulatory Visit: Payer: Self-pay

## 2021-04-25 ENCOUNTER — Telehealth: Payer: Self-pay | Admitting: Nurse Practitioner

## 2021-04-25 NOTE — Telephone Encounter (Signed)
Left message for patient to find out if he was able to get his insulin since [facility] was out in follow up to his VM on 2/3

## 2021-04-30 ENCOUNTER — Telehealth: Payer: Self-pay

## 2021-04-30 NOTE — Telephone Encounter (Signed)
Patient called office today requesting Rx for his insulin be sent to [facility]. Patient is following Thad Ranger, NP for primary care. Advised that he follow up with her office regarding refill since she is managing his diabetes.  Patient verbalized understanding. Juanita Laster, RMA

## 2021-05-19 ENCOUNTER — Other Ambulatory Visit: Payer: Self-pay

## 2021-05-19 ENCOUNTER — Other Ambulatory Visit: Payer: Self-pay | Admitting: Nurse Practitioner

## 2021-05-19 MED ORDER — TECHLITE PEN NEEDLES 32G X 4 MM MISC
1.0000 | 1 refills | Status: DC
  Filled 2021-05-19 – 2021-06-25 (×2): qty 100, 30d supply, fill #0

## 2021-05-23 ENCOUNTER — Other Ambulatory Visit: Payer: Self-pay

## 2021-06-08 ENCOUNTER — Other Ambulatory Visit: Payer: Self-pay | Admitting: Internal Medicine

## 2021-06-10 ENCOUNTER — Ambulatory Visit (INDEPENDENT_AMBULATORY_CARE_PROVIDER_SITE_OTHER): Payer: Self-pay | Admitting: Nurse Practitioner

## 2021-06-10 ENCOUNTER — Other Ambulatory Visit: Payer: Self-pay

## 2021-06-10 ENCOUNTER — Encounter: Payer: Self-pay | Admitting: Nurse Practitioner

## 2021-06-10 VITALS — BP 138/82 | HR 84 | Temp 98.1°F | Ht 66.0 in | Wt 187.2 lb

## 2021-06-10 DIAGNOSIS — Z0000 Encounter for general adult medical examination without abnormal findings: Secondary | ICD-10-CM

## 2021-06-10 DIAGNOSIS — E1165 Type 2 diabetes mellitus with hyperglycemia: Secondary | ICD-10-CM

## 2021-06-10 LAB — POCT GLYCOSYLATED HEMOGLOBIN (HGB A1C)
HbA1c POC (<> result, manual entry): 6.2 % (ref 4.0–5.6)
HbA1c, POC (controlled diabetic range): 6.2 % (ref 0.0–7.0)
HbA1c, POC (prediabetic range): 6.2 % (ref 5.7–6.4)
Hemoglobin A1C: 6.2 % — AB (ref 4.0–5.6)

## 2021-06-10 MED ORDER — BLOOD GLUCOSE MONITOR KIT: PACK | 0 refills | Status: DC

## 2021-06-10 MED ORDER — LEVEMIR FLEXTOUCH 100 UNIT/ML ~~LOC~~ SOPN: PEN_INJECTOR | SUBCUTANEOUS | 3 refills | Status: DC

## 2021-06-10 NOTE — Progress Notes (Signed)
? ?[address] ?[place], [place]  [address] ?Phone:  [phone]   Fax:  [phone] ?Subjective:  ? Patient ID: Donald Wheeler, male    DOB: 03/31/1970, 51 y.o.   MRN: [id number] ? ?Chief Complaint  ?Patient presents with  ? Follow-up  ?  Pt is here for follow up. 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  ? ?HPI ?Donald Wheeler 51 y.o. male  has a past medical history of Bell's palsy (09/07/2013), Diabetes mellitus without complication ([place]) (15/0569), HIV (human immunodeficiency virus infection) ([place]), Hypertension, and Stroke ([place]) (04/09/2017). To [facility] today for annual wellness physical. ? ?Diabetes Mellitus: Patient presents for follow up of diabetes. Symptoms: none. Symptoms have been basically asymptomatic. Patient denies foot ulcerations, nausea, polydipsia, and polyuria.  Evaluation to date has been included: hemoglobin A1C.  Home sugars: patient does not check sugars. Treatment to date: no recent interventions. Endorses monitoring meals at home and walks intermittently throughout the day.  ? ?Hypertension: Patient here for follow-up of elevated blood pressure. He is exercising and is adherent to low salt diet.  Is not checking B/P at home. Cardiac symptoms none. Patient denies chest pain, claudication, and irregular heart beat.  Cardiovascular risk factors: diabetes mellitus, dyslipidemia, hypertension, and male gender. Use of agents associated with hypertension: none. History of target organ damage: none ? 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 Denies any fever. Denies any fatigue, chest pain, shortness of breath, HA or dizziness. Denies any blurred vision, numbness or tingling. ? ? ?Past Medical History:  ?Diagnosis Date  ? Bell's palsy 09/07/2013  ? Diabetes mellitus without complication ([place]) 79/4801  ? NEW ONSET   ? HIV (human immunodeficiency virus infection) ([place])   ? dx'd 2000s  ? Hypertension   ? Stroke ([place]) 04/09/2017  ? vs TIA  w/acute onset of right-sided weakness as well as trouble speaking/notes 04/09/2017  ? ? ?Past Surgical History:  ?Procedure Laterality Date  ? HEMORRHOID SURGERY    ? ? ?Family History  ?Problem Relation Age of Onset  ? Hypertension Mother   ? Diabetes Mother   ? ? ?Social History  ? ?Socioeconomic History  ? Marital status: Divorced  ?  Spouse name: Not on file  ? Number of children: Not on file  ? Years of education: Not on file  ? Highest education level: Not on file  ?Occupational History  ? Not on file  ?Tobacco Use  ? Smoking status: Some Days  ?  Packs/day: 0.20  ?  Years: 15.00  ?  Pack years: 3.00  ?  Types: Cigars, Cigarettes  ?  Last attempt to quit: 12/12/2017  ?  Years since quitting: 3.4  ? Smokeless tobacco: Never  ? Tobacco comments:  ?  black and mildsl daily  ?Vaping Use  ? Vaping Use: Never used  ?Substance and Sexual Activity  ? Alcohol use: Yes  ?  Comment: 04/09/2017 "might have 2 beers/month; if that"  ? Drug use: Not Currently  ?  Types: Marijuana  ? Sexual activity: Not Currently  ?  Partners: Female  ?  Birth control/protection: None, Condom  ?  Comment: declined  condoms  ?Other Topics Concern  ? Not on file  ?Social History Narrative  ? Not on file  ? ?Social Determinants of Health  ? ?Financial Resource Strain: Not on file  ?Food Insecurity: Not on file  ?Transportation Needs: Not on file  ?Physical Activity: Not on file  ?  Stress: Not on file  ?Social Connections: Not on file  ?Intimate Partner Violence: Not on file  ? ? ?Outpatient Medications Prior to Visit  ?Medication Sig Dispense Refill  ? amLODipine (NORVASC) 10 MG tablet TAKE 1 TABLET(10 MG) BY MOUTH DAILY 30 tablet 11  ? ASPIRIN LOW DOSE 81 MG EC tablet TAKE 1 TABLET(81 MG) BY MOUTH DAILY 100 tablet 3  ? atorvastatin (LIPITOR) 80 MG tablet Take 1 tablet (80 mg total) by mouth daily. TAKE 1 TABLET BY MOUTH DAILY 90 tablet 3  ? bictegravir-emtricitabine-tenofovir AF (BIKTARVY) 50-200-25 MG TABS tablet Take 1 tablet by mouth daily. 30  tablet 11  ? Insulin Pen Needle (TECHLITE PEN NEEDLES) 32G X 4 MM MISC use as directed with insulin pen 100 each 1  ? losartan-hydrochlorothiazide (HYZAAR) 50-12.5 MG tablet Take 1 tablet by mouth daily. 90 tablet 3  ? metFORMIN (GLUCOPHAGE) 500 MG tablet Take 1 tablet (500 mg total) by mouth daily with breakfast. 90 tablet 3  ? blood glucose meter kit and supplies KIT Dispense based on patient and insurance preference. Use up to four times daily as directed. 1 each 0  ? insulin detemir (LEVEMIR FLEXTOUCH) 100 UNIT/ML FlexPen Administer 25units under the skin daily 15 mL 3  ? dicyclomine (BENTYL) 20 MG tablet Take 1 tablet (20 mg total) by mouth 2 (two) times daily. (Patient not taking: Reported on 02/03/2021) 20 tablet 0  ? FLUoxetine (PROZAC) 20 MG capsule Take 1 capsule (20 mg total) by mouth daily. (Patient not taking: Reported on 08/13/2020) 30 capsule 3  ? guaiFENesin-dextromethorphan (ROBITUSSIN DM) 100-10 MG/5ML syrup Take 5 mLs by mouth every 4 (four) hours as needed for cough. (Patient not taking: Reported on 06/10/2021) 118 mL 0  ? ondansetron (ZOFRAN ODT) 4 MG disintegrating tablet Take 1 tablet (4 mg total) by mouth every 8 (eight) hours as needed for nausea or vomiting. (Patient not taking: Reported on 06/10/2021) 20 tablet 0  ? ?No facility-administered medications prior to visit.  ? ? ?No Known Allergies ? ?Review of Systems  ?Constitutional:  Negative for chills, fever and malaise/fatigue.  ?HENT: Negative.    ?Eyes: Negative.   ?Respiratory:  Negative for cough and shortness of breath.   ?Cardiovascular:  Negative for chest pain, palpitations and leg swelling.  ?Gastrointestinal:  Negative for abdominal pain, blood in stool, constipation, diarrhea, nausea and vomiting.  ?Genitourinary: Negative.   ?Musculoskeletal: Negative.   ?Skin: Negative.   ?Neurological: Negative.   ?Psychiatric/Behavioral:  Negative for depression. The patient is not nervous/anxious.   ?All other systems reviewed and are  negative. ? ?   ?Objective:  ?  ?Physical Exam ?Vitals reviewed.  ?Constitutional:   ?   General: He is not in acute distress. ?   Appearance: Normal appearance. He is normal weight.  ?HENT:  ?   Head: Normocephalic.  ?   Right Ear: Tympanic membrane, ear canal and external ear normal. There is no impacted cerumen.  ?   Left Ear: Tympanic membrane, ear canal and external ear normal. There is no impacted cerumen.  ?   Nose: Nose normal. No congestion or rhinorrhea.  ?   Mouth/Throat:  ?   Mouth: Mucous membranes are moist.  ?   Pharynx: Oropharynx is clear. No oropharyngeal exudate or posterior oropharyngeal erythema.  ?Eyes:  ?   General: No scleral icterus.    ?   Right eye: No discharge.     ?   Left eye: No discharge.  ?  Extraocular Movements: Extraocular movements intact.  ?   Conjunctiva/sclera: Conjunctivae normal.  ?   Pupils: Pupils are equal, round, and reactive to light.  ?Neck:  ?   Vascular: No carotid bruit.  ?Cardiovascular:  ?   Rate and Rhythm: Normal rate and regular rhythm.  ?   Pulses: Normal pulses.  ?   Heart sounds: Normal heart sounds.  ?   Comments: No obvious peripheral edema ?Pulmonary:  ?   Effort: Pulmonary effort is normal.  ?   Breath sounds: Normal breath sounds.  ?Abdominal:  ?   General: Abdomen is flat. Bowel sounds are normal. There is no distension.  ?   Palpations: Abdomen is soft. There is no mass.  ?   Tenderness: There is no abdominal tenderness. There is no right CVA tenderness, left CVA tenderness, guarding or rebound.  ?   Hernia: No hernia is present.  ?Musculoskeletal:     ?   General: No swelling, tenderness, deformity or signs of injury. Normal range of motion.  ?   Cervical back: Normal range of motion and neck supple. No rigidity or tenderness.  ?   Right lower leg: No edema.  ?   Left lower leg: No edema.  ?Lymphadenopathy:  ?   Cervical: No cervical adenopathy.  ?Skin: ?   General: Skin is warm and dry.  ?   Capillary Refill: Capillary refill takes less than 2  seconds.  ?Neurological:  ?   General: No focal deficit present.  ?   Mental Status: He is alert and oriented to person, place, and time.  ?Psychiatric:     ?   Mood and Affect: Mood normal.     ?   Behavior:

## 2021-06-10 NOTE — Patient Instructions (Signed)
You were seen today in the [facility] for wellness exam. Labs were collected, results will be available via MyChart or, if abnormal, you will be contacted by clinic staff. You were prescribed medications, please take as directed. Please follow up in 3 mths for reevaluation.  ?

## 2021-06-11 LAB — LIPID PANEL
Chol/HDL Ratio: 4.3 ratio (ref 0.0–5.0)
Cholesterol, Total: 150 mg/dL (ref 100–199)
HDL: 35 mg/dL — ABNORMAL LOW (ref >39)
LDL Chol Calc (NIH): 90 mg/dL (ref 0–99)
Triglycerides: 139 mg/dL (ref 0–149)
VLDL Cholesterol Cal: 25 mg/dL (ref 5–40)

## 2021-06-25 ENCOUNTER — Other Ambulatory Visit: Payer: Self-pay

## 2021-06-26 ENCOUNTER — Ambulatory Visit: Payer: Self-pay | Admitting: Nurse Practitioner

## 2021-06-27 ENCOUNTER — Ambulatory Visit: Payer: Self-pay | Admitting: Nurse Practitioner

## 2021-07-19 ENCOUNTER — Other Ambulatory Visit: Payer: Self-pay | Admitting: Internal Medicine

## 2021-07-21 NOTE — Telephone Encounter (Signed)
Appt 5/9 - needs to renew ADAP ?

## 2021-07-22 ENCOUNTER — Encounter: Payer: Self-pay | Admitting: Internal Medicine

## 2021-07-22 ENCOUNTER — Ambulatory Visit (INDEPENDENT_AMBULATORY_CARE_PROVIDER_SITE_OTHER): Payer: Self-pay | Admitting: Internal Medicine

## 2021-07-22 ENCOUNTER — Other Ambulatory Visit: Payer: Self-pay

## 2021-07-22 ENCOUNTER — Ambulatory Visit: Payer: Self-pay

## 2021-07-22 VITALS — BP 144/88 | HR 80 | Temp 97.8°F | Ht 67.0 in | Wt 189.0 lb

## 2021-07-22 DIAGNOSIS — I10 Essential (primary) hypertension: Secondary | ICD-10-CM

## 2021-07-22 DIAGNOSIS — Z23 Encounter for immunization: Secondary | ICD-10-CM

## 2021-07-22 DIAGNOSIS — E118 Type 2 diabetes mellitus with unspecified complications: Secondary | ICD-10-CM

## 2021-07-22 DIAGNOSIS — B20 Human immunodeficiency virus [HIV] disease: Secondary | ICD-10-CM

## 2021-07-22 DIAGNOSIS — Z0000 Encounter for general adult medical examination without abnormal findings: Secondary | ICD-10-CM

## 2021-07-22 NOTE — Progress Notes (Signed)
?QMG:QQPYPP up for hiv disease ? ?Patient ID: Donald Wheeler, male   DOB: 1971/02/27, 51 y.o.   MRN: [id number] ? ?HPI ?[name] is a 51yo M with HIV disease on biktarvy, CD 4 count of 554/VL<20 in dec 2022, he reports excellent adherence. He reports no complaints about his health. ? ?Has not had colon cancer screening; BS fluctuates, but usually not higher 180. ? 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 ?Family hx: colon ca mother - in her 11 ? ?Outpatient Encounter Medications as of 07/22/2021  ?Medication Sig  ? amLODipine (NORVASC) 10 MG tablet TAKE 1 TABLET(10 MG) BY MOUTH DAILY  ? ASPIRIN LOW DOSE 81 MG EC tablet TAKE 1 TABLET(81 MG) BY MOUTH DAILY  ? BIKTARVY 50-200-25 MG TABS tablet TAKE 1 TABLET BY MOUTH DAILY  ? blood glucose meter kit and supplies KIT Dispense based on patient and insurance preference. Use up to four times daily as directed.  ? insulin detemir (LEVEMIR FLEXTOUCH) 100 UNIT/ML FlexPen Administer 25units under the skin daily  ? Insulin Pen Needle (TECHLITE PEN NEEDLES) 32G X 4 MM MISC use as directed with insulin pen  ? losartan-hydrochlorothiazide (HYZAAR) 50-12.5 MG tablet Take 1 tablet by mouth daily.  ? metFORMIN (GLUCOPHAGE) 500 MG tablet Take 1 tablet (500 mg total) by mouth daily with breakfast.  ? atorvastatin (LIPITOR) 80 MG tablet Take 1 tablet (80 mg total) by mouth daily. TAKE 1 TABLET BY MOUTH DAILY (Patient not taking: Reported on 07/22/2021)  ? dicyclomine (BENTYL) 20 MG tablet Take 1 tablet (20 mg total) by mouth 2 (two) times daily. (Patient not taking: Reported on 02/03/2021)  ? FLUoxetine (PROZAC) 20 MG capsule Take 1 capsule (20 mg total) by mouth daily. (Patient not taking: Reported on 08/13/2020)  ? guaiFENesin-dextromethorphan (ROBITUSSIN DM) 100-10 MG/5ML syrup Take 5 mLs by mouth every 4 (four) hours as needed for cough. (Patient not taking: Reported on 06/10/2021)  ? ondansetron (ZOFRAN ODT) 4 MG disintegrating tablet Take 1  tablet (4 mg total) by mouth every 8 (eight) hours as needed for nausea or vomiting. (Patient not taking: Reported on 06/10/2021)  ? ?No facility-administered encounter medications on file as of 07/22/2021.  ?  ? ?Patient Active Problem List  ? Diagnosis Date Noted  ? Healthcare maintenance 02/19/2021  ? PSA elevation 02/05/2021  ? Persistent microscopic hematuria 02/05/2021  ? Controlled type 2 diabetes mellitus with microalbuminuria, with long-term current use of insulin ([place]) 08/09/2019  ? Vitamin D deficiency 05/10/2019  ? Shingles 07/2018  ? Hyperosmolar non-ketotic state in patient with type 2 diabetes mellitus ([name]) 12/23/2017  ? Diabetes mellitus, new onset ([name]) 12/23/2017  ? History of hematuria 08/16/2017  ? Mixed hyperlipidemia 06/16/2017  ? Cerebral thrombosis with cerebral infarction 04/10/2017  ? Solitary pulmonary nodule 04/10/2017  ? Focal neurological deficit 04/09/2017  ? CKD (chronic kidney disease) stage 3, GFR 30-59 ml/min 04/09/2017  ? Smoker 04/09/2017  ? HTN (hypertension), benign 09/07/2013  ? HIV (human immunodeficiency virus infection) ([place]) 03/17/2012  ? ? ? ?Health Maintenance Due  ?Topic Date Due  ? COVID-19 Vaccine (4 - Booster for Pfizer series) 03/01/2020  ?  ? ?Review of Systems ? ?Constitutional: Negative for fever, chills, diaphoresis, activity change, appetite change, fatigue and unexpected weight change.  ?HENT: Negative for congestion, sore throat, rhinorrhea, sneezing, trouble swallowing and sinus pressure.  ?Eyes: Negative for photophobia and visual disturbance.  ?Respiratory: Negative for cough, chest tightness, shortness of breath, wheezing  and stridor.  ?Cardiovascular: Negative for chest pain, palpitations and leg swelling.  ?Gastrointestinal: Negative for nausea, vomiting, abdominal pain, diarrhea, constipation, blood in stool, abdominal distention and anal bleeding.  ?Genitourinary: Negative for dysuria, hematuria, flank pain and difficulty urinating.  ?Musculoskeletal:  Negative for myalgias, back pain, joint swelling, arthralgias and gait problem.  ?Skin: Negative for color change, pallor, rash and wound.  ?Neurological: Negative for dizziness, tremors, weakness and light-headedness.  ?Hematological: Negative for adenopathy. Does not bruise/bleed easily.  ?Psychiatric/Behavioral: Negative for behavioral problems, confusion, sleep disturbance, dysphoric mood, decreased concentration and agitation.  ? ?Physical Exam  ? ?Wt 189 lb (85.7 kg)   BMI 30.51 kg/m?   ?Physical Exam  ?Constitutional: He is oriented to person, place, and time. He appears well-developed and well-nourished. No distress.  ?HENT:  ?Mouth/Throat: Oropharynx is clear and moist. No oropharyngeal exudate.  ?Cardiovascular: Normal rate, regular rhythm and normal heart sounds. Exam reveals no gallop and no friction rub.  ?No murmur heard.  ?Pulmonary/Chest: Effort normal and breath sounds normal. No respiratory distress. He has no wheezes.  ?Abdominal: Soft. Bowel sounds are normal. He exhibits no distension. There is no tenderness.  ?Lymphadenopathy:  ?He has no cervical adenopathy.  ?Neurological: He is alert and oriented to person, place, and time.  ?Skin: Skin is warm and dry. No rash noted. No erythema.  ?Psychiatric: He has a normal mood and affect. His behavior is normal.  ? ?Lab Results  ?Component Value Date  ? CD4TCELL 21 (L) 02/19/2021  ? ?Lab Results  ?Component Value Date  ? CD4TABS 554 02/19/2021  ? CD4TABS 454 07/15/2020  ? CD4TABS 420 11/22/2019  ? ?Lab Results  ?Component Value Date  ? HIV1RNAQUANT Not Detected 02/19/2021  ? ?Lab Results  ?Component Value Date  ? HEPBSAB NONREACTIVE 03/03/2012  ? ?Lab Results  ?Component Value Date  ? LABRPR NON-REACTIVE 02/19/2021  ? ? ?CBC ?Lab Results  ?Component Value Date  ? WBC 6.8 01/06/2021  ? RBC 5.00 01/06/2021  ? HGB 14.7 01/06/2021  ? HCT 43.6 01/06/2021  ? PLT 203 01/06/2021  ? MCV 87.2 01/06/2021  ? MCH 29.4 01/06/2021  ? MCHC 33.7 01/06/2021  ? RDW 14.0  01/06/2021  ? LYMPHSABS 2.1 01/06/2021  ? MONOABS 0.6 01/06/2021  ? EOSABS 0.1 01/06/2021  ? ? ?BMET ?Lab Results  ?Component Value Date  ? NA 143 02/19/2021  ? K 4.0 02/19/2021  ? CL 107 02/19/2021  ? CO2 27 02/19/2021  ? GLUCOSE 94 02/19/2021  ? BUN 18 02/19/2021  ? CREATININE 1.70 (H) 02/19/2021  ? CALCIUM 8.6 02/19/2021  ? GFRNONAA >60 01/06/2021  ? GFRAA 61 07/15/2020  ? ? ? ? ?Assessment and Plan ? ?Hiv disease= will get labs and refills on biktarvy ? ?Htn = hasn't taken BP meds yet, sBP 140s not yet on  ? ?IDDM = sees crystal king for DM management ? ?Health maintenance = PCV20  today and gave referral for colonoscopy ? ?

## 2021-07-23 LAB — T-HELPER CELLS (CD4) COUNT (NOT AT ARMC)
CD4 % Helper T Cell: 21 % — ABNORMAL LOW (ref 33–65)
CD4 T Cell Abs: 488 /uL (ref 400–1790)

## 2021-07-24 ENCOUNTER — Other Ambulatory Visit: Payer: Self-pay

## 2021-07-25 ENCOUNTER — Other Ambulatory Visit: Payer: Self-pay

## 2021-07-25 LAB — CBC WITH DIFFERENTIAL/PLATELET
Absolute Monocytes: 610 cells/uL (ref 200–950)
Basophils Absolute: 20 cells/uL (ref 0–200)
Basophils Relative: 0.3 %
Eosinophils Absolute: 107 cells/uL (ref 15–500)
Eosinophils Relative: 1.6 %
HCT: 46.5 % (ref 38.5–50.0)
Hemoglobin: 15.8 g/dL (ref 13.2–17.1)
Lymphs Abs: 2566 cells/uL (ref 850–3900)
MCH: 30.4 pg (ref 27.0–33.0)
MCHC: 34 g/dL (ref 32.0–36.0)
MCV: 89.4 fL (ref 80.0–100.0)
MPV: 11.6 fL (ref 7.5–12.5)
Monocytes Relative: 9.1 %
Neutro Abs: 3397 cells/uL (ref 1500–7800)
Neutrophils Relative %: 50.7 %
Platelets: 209 10*3/uL (ref 140–400)
RBC: 5.2 10*6/uL (ref 4.20–5.80)
RDW: 14.5 % (ref 11.0–15.0)
Total Lymphocyte: 38.3 %
WBC: 6.7 10*3/uL (ref 3.8–10.8)

## 2021-07-25 LAB — COMPLETE METABOLIC PANEL WITH GFR
AG Ratio: 1.3 (calc) (ref 1.0–2.5)
ALT: 18 U/L (ref 9–46)
AST: 15 U/L (ref 10–35)
Albumin: 4.3 g/dL (ref 3.6–5.1)
Alkaline phosphatase (APISO): 75 U/L (ref 35–144)
BUN/Creatinine Ratio: 14 (calc) (ref 6–22)
BUN: 24 mg/dL (ref 7–25)
CO2: 26 mmol/L (ref 20–32)
Calcium: 9 mg/dL (ref 8.6–10.3)
Chloride: 105 mmol/L (ref 98–110)
Creat: 1.67 mg/dL — ABNORMAL HIGH (ref 0.70–1.30)
Globulin: 3.3 g/dL (calc) (ref 1.9–3.7)
Glucose, Bld: 60 mg/dL — ABNORMAL LOW (ref 65–99)
Potassium: 4.3 mmol/L (ref 3.5–5.3)
Sodium: 139 mmol/L (ref 135–146)
Total Bilirubin: 0.5 mg/dL (ref 0.2–1.2)
Total Protein: 7.6 g/dL (ref 6.1–8.1)
eGFR: 49 mL/min/{1.73_m2} — ABNORMAL LOW (ref >=60)

## 2021-07-25 LAB — HIV-1 RNA QUANT-NO REFLEX-BLD
HIV 1 RNA Quant: <20 copies/mL — AB
HIV-1 RNA Quant, Log: <1.3 Log copies/mL — AB

## 2021-07-25 LAB — RPR: RPR Ser Ql: NONREACTIVE

## 2021-07-28 ENCOUNTER — Ambulatory Visit (INDEPENDENT_AMBULATORY_CARE_PROVIDER_SITE_OTHER): Payer: Self-pay | Admitting: Nurse Practitioner

## 2021-07-28 ENCOUNTER — Encounter: Payer: Self-pay | Admitting: Nurse Practitioner

## 2021-07-28 VITALS — BP 145/92 | HR 79 | Temp 98.3°F | Ht 66.0 in | Wt 191.0 lb

## 2021-07-28 DIAGNOSIS — E1165 Type 2 diabetes mellitus with hyperglycemia: Secondary | ICD-10-CM

## 2021-07-28 LAB — POCT GLYCOSYLATED HEMOGLOBIN (HGB A1C)
HbA1c POC (<> result, manual entry): 6.5 % (ref 4.0–5.6)
HbA1c, POC (controlled diabetic range): 6.5 % (ref 0.0–7.0)
HbA1c, POC (prediabetic range): 6.5 % — AB (ref 5.7–6.4)
Hemoglobin A1C: 6.5 % — AB (ref 4.0–5.6)

## 2021-07-28 MED ORDER — METFORMIN HCL 1000 MG PO TABS: 1000.0000 mg | ORAL_TABLET | Freq: Two times a day (BID) | ORAL | 3 refills | Status: DC

## 2021-07-28 MED ORDER — BLOOD GLUCOSE METER KIT: PACK | 0 refills | Status: DC

## 2021-07-28 NOTE — Patient Instructions (Addendum)
1. Uncontrolled type 2 diabetes mellitus with hyperglycemia  ? ? ?Will trial going off insulin: ? ?- POCT glycosylated hemoglobin (Hb A1C) ?- metFORMIN (GLUCOPHAGE) 1000 MG tablet; Take 1 tablet (1,000 mg total) by mouth 2 (two) times daily with a meal.  Dispense: 180 tablet; Refill: 3 ?- blood glucose meter kit and supplies; Dispense based on patient and insurance preference. Use up to four times daily as directed. (FOR ICD-10 E10.9, E11.9).  Dispense: 1 each; Refill: 0 ? ?Follow up: ? ?Follow up in 4 weeks or sooner if needed - will need repeat CBC and CMP ? ?

## 2021-07-28 NOTE — Progress Notes (Addendum)
[email or web address]  ID: [place] Callas, male    DOB: 10/22/1970, 51 y.o.   MRN: [id number]  Chief Complaint  Patient presents with   Med Change Request    Pt would like his medication change for work. Pt is requesting insulin detemir (LEVEMIR FLEXTOUCH in a pill form    Referring provider: Vevelyn Francois, NP   HPI   Patient presents today for medication change.  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  Patient was told that he could not be on insulin and perform his current job.  Patient will suture we will switch patient to oral diabetic medications.  We discussed that we can give this a trial.  Patient will need to return in 3 months for repeat hemoglobin A1c.  Patient does need to check his blood sugars at home to make sure they are not getting too high. Denies f/c/s, n/v/d, hemoptysis, PND, chest pain or edema.    No Known Allergies  Immunization History  Administered Date(s) Administered   Hepatitis A 02/22/2012   Hepatitis A, Adult 02/20/2013   Hepatitis B 02/22/2012, 03/17/2012   Influenza Split 03/17/2012   Influenza Whole 03/17/2019   Influenza,inj,Quad PF,6+ Mos 02/20/2013, 01/03/2014, 11/07/2018   Meningococcal Mcv4o 05/11/2016   PFIZER(Purple Top)SARS-COV-2 Vaccination 05/29/2019, 06/20/2019, 01/05/2020   PNEUMOCOCCAL CONJUGATE-20 07/22/2021   Pneumococcal Polysaccharide-23 03/17/2012   Td 02/22/2012   Tdap 02/22/2012    Past Medical History:  Diagnosis Date   Bell's palsy 09/07/2013   Diabetes mellitus without complication ([place]) 61/4431   NEW ONSET    HIV (human immunodeficiency virus infection) ([place])    dx'd 2000s   Hypertension    Stroke ([place]) 04/09/2017   vs TIA w/acute onset of right-sided weakness as well as trouble speaking/notes 04/09/2017    Tobacco History: Social History   Tobacco Use  Smoking Status Some Days   Packs/day: 0.20   Years: 15.00   Total pack years: 3.00   Types: Cigars, Cigarettes   Last  attempt to quit: 12/12/2017   Years since quitting: 3.7  Smokeless Tobacco Never  Tobacco Comments   black and mildsl daily   Ready to quit: Not Answered Counseling given: Not Answered Tobacco comments: black and mildsl daily   Outpatient Encounter Medications as of 07/28/2021  Medication Sig   amLODipine (NORVASC) 10 MG tablet TAKE 1 TABLET(10 MG) BY MOUTH DAILY   ASPIRIN LOW DOSE 81 MG EC tablet TAKE 1 TABLET(81 MG) BY MOUTH DAILY   atorvastatin (LIPITOR) 80 MG tablet Take 1 tablet (80 mg total) by mouth daily. TAKE 1 TABLET BY MOUTH DAILY   BIKTARVY 50-200-25 MG TABS tablet TAKE 1 TABLET BY MOUTH DAILY   blood glucose meter kit and supplies KIT Dispense based on patient and insurance preference. Use up to four times daily as directed.   blood glucose meter kit and supplies Dispense based on patient and insurance preference. Use up to four times daily as directed. (FOR ICD-10 E10.9, E11.9).   insulin detemir (LEVEMIR FLEXTOUCH) 100 UNIT/ML FlexPen Administer 25units under the skin daily   Insulin Pen Needle (TECHLITE PEN NEEDLES) 32G X 4 MM MISC use as directed with insulin pen   losartan-hydrochlorothiazide (HYZAAR) 50-12.5 MG tablet Take 1 tablet by mouth daily.   metFORMIN (GLUCOPHAGE) 1000 MG tablet Take 1 tablet (1,000 mg total) by mouth 2 (two) times daily with a meal.   [DISCONTINUED] metFORMIN (GLUCOPHAGE) 500 MG tablet Take 1 tablet (500 mg total) by mouth  daily with breakfast.   dicyclomine (BENTYL) 20 MG tablet Take 1 tablet (20 mg total) by mouth 2 (two) times daily. (Patient not taking: Reported on 02/03/2021)   FLUoxetine (PROZAC) 20 MG capsule Take 1 capsule (20 mg total) by mouth daily. (Patient not taking: Reported on 08/13/2020)   guaiFENesin-dextromethorphan (ROBITUSSIN DM) 100-10 MG/5ML syrup Take 5 mLs by mouth every 4 (four) hours as needed for cough. (Patient not taking: Reported on 06/10/2021)   ondansetron (ZOFRAN ODT) 4 MG disintegrating tablet Take 1 tablet (4 mg  total) by mouth every 8 (eight) hours as needed for nausea or vomiting. (Patient not taking: Reported on 06/10/2021)   No facility-administered encounter medications on file as of 07/28/2021.     Review of Systems  Review of Systems  Constitutional: Negative.   HENT: Negative.    Cardiovascular: Negative.   Gastrointestinal: Negative.   Allergic/Immunologic: Negative.   Neurological: Negative.   Psychiatric/Behavioral: Negative.         Physical Exam  BP (!) 145/92 (BP Location: Right Arm, Patient Position: Sitting, Cuff Size: Large)   Pulse 79   Temp 98.3 F (36.8 C)   Ht 5' 6"  (1.676 m)   Wt 191 lb (86.6 kg)   SpO2 100%   BMI 30.83 kg/m   Wt Readings from Last 5 Encounters:  09/04/21 189 lb 3.2 oz (85.8 kg)  07/28/21 191 lb (86.6 kg)  07/22/21 189 lb (85.7 kg)  06/10/21 187 lb 4 oz (84.9 kg)  03/20/21 192 lb 3.2 oz (87.2 kg)     Physical Exam Vitals and nursing note reviewed.  Constitutional:      General: He is not in acute distress.    Appearance: He is well-developed.  Cardiovascular:     Rate and Rhythm: Normal rate and regular rhythm.  Pulmonary:     Effort: Pulmonary effort is normal.     Breath sounds: Normal breath sounds.  Skin:    General: Skin is warm and dry.  Neurological:     Mental Status: He is alert and oriented to person, place, and time.      Lab Results:  CBC    Component Value Date/Time   WBC 6.7 07/22/2021 0237   RBC 5.20 07/22/2021 0237   HGB 15.8 07/22/2021 0237   HGB 14.9 05/08/2019 1013   HCT 46.5 07/22/2021 0237   HCT 45.4 05/08/2019 1013   PLT 209 07/22/2021 0237   PLT 196 05/08/2019 1013   MCV 89.4 07/22/2021 0237   MCV 90 05/08/2019 1013   MCH 30.4 07/22/2021 0237   MCHC 34.0 07/22/2021 0237   RDW 14.5 07/22/2021 0237   RDW 15.7 (H) 05/08/2019 1013   LYMPHSABS 2,566 07/22/2021 0237   LYMPHSABS 2.3 05/08/2019 1013   MONOABS 0.6 01/06/2021 1042   EOSABS 107 07/22/2021 0237   EOSABS 0.1 05/08/2019 1013    BASOSABS 20 07/22/2021 0237   BASOSABS 0.0 05/08/2019 1013    BMET    Component Value Date/Time   NA 139 07/22/2021 0237   NA 143 05/08/2019 1013   K 4.3 07/22/2021 0237   CL 105 07/22/2021 0237   CO2 26 07/22/2021 0237   GLUCOSE 60 (L) 07/22/2021 0237   BUN 24 07/22/2021 0237   BUN 18 05/08/2019 1013   CREATININE 1.67 (H) 07/22/2021 0237   CALCIUM 9.0 07/22/2021 0237   GFRNONAA >60 01/06/2021 1042   GFRNONAA 52 (L) 07/15/2020 0000   GFRAA 61 07/15/2020 0000    BNP No results found for: "  BNP"  ProBNP No results found for: "PROBNP"  Imaging: No results found.   Assessment & Plan:   Uncontrolled type 2 diabetes mellitus with hyperglycemia (HCC) Will trial going off insulin:  - POCT glycosylated hemoglobin (Hb A1C) - metFORMIN (GLUCOPHAGE) 1000 MG tablet; Take 1 tablet (1,000 mg total) by mouth 2 (two) times daily with a meal.  Dispense: 180 tablet; Refill: 3 - blood glucose meter kit and supplies; Dispense based on patient and insurance preference. Use up to four times daily as directed. (FOR ICD-10 E10.9, E11.9).  Dispense: 1 each; Refill: 0  Follow up:  Follow up in 4 weeks or sooner if needed - will need repeat CBC and CMP     Fenton Foy, NP 09/04/2021

## 2021-08-04 ENCOUNTER — Encounter: Payer: Self-pay | Admitting: Nurse Practitioner

## 2021-08-04 DIAGNOSIS — E1165 Type 2 diabetes mellitus with hyperglycemia: Secondary | ICD-10-CM | POA: Insufficient documentation

## 2021-08-04 NOTE — Assessment & Plan Note (Signed)
Will trial going off insulin:  - POCT glycosylated hemoglobin (Hb A1C) - metFORMIN (GLUCOPHAGE) 1000 MG tablet; Take 1 tablet (1,000 mg total) by mouth 2 (two) times daily with a meal.  Dispense: 180 tablet; Refill: 3 - blood glucose meter kit and supplies; Dispense based on patient and insurance preference. Use up to four times daily as directed. (FOR ICD-10 E10.9, E11.9).  Dispense: 1 each; Refill: 0  Follow up:  Follow up in 4 weeks or sooner if needed - will need repeat CBC and CMP

## 2021-08-21 ENCOUNTER — Other Ambulatory Visit: Payer: Self-pay | Admitting: Internal Medicine

## 2021-08-21 DIAGNOSIS — I10 Essential (primary) hypertension: Secondary | ICD-10-CM

## 2021-08-28 ENCOUNTER — Other Ambulatory Visit: Payer: Self-pay

## 2021-09-01 ENCOUNTER — Ambulatory Visit: Payer: Self-pay | Admitting: Nurse Practitioner

## 2021-09-04 ENCOUNTER — Encounter: Payer: Self-pay | Admitting: Nurse Practitioner

## 2021-09-04 ENCOUNTER — Ambulatory Visit (INDEPENDENT_AMBULATORY_CARE_PROVIDER_SITE_OTHER): Payer: Self-pay | Admitting: Nurse Practitioner

## 2021-09-04 VITALS — BP 145/97 | HR 79 | Temp 97.8°F | Ht 66.0 in | Wt 189.2 lb

## 2021-09-04 DIAGNOSIS — I10 Essential (primary) hypertension: Secondary | ICD-10-CM

## 2021-09-04 DIAGNOSIS — E1165 Type 2 diabetes mellitus with hyperglycemia: Secondary | ICD-10-CM

## 2021-09-04 MED ORDER — AMLODIPINE BESYLATE 10 MG PO TABS: ORAL_TABLET | ORAL | 11 refills | Status: DC

## 2021-09-04 NOTE — Patient Instructions (Addendum)
1. Uncontrolled type 2 diabetes mellitus with hyperglycemia (HCC)  - CBC - Comprehensive metabolic panel  2. HTN (hypertension), benign  - amLODipine (NORVASC) 10 MG tablet; TAKE 1 TABLET(10 MG) BY MOUTH DAILY  Dispense: 30 tablet; Refill: 11   Follow up:  Follow up in 2 months

## 2021-09-04 NOTE — Assessment & Plan Note (Signed)
-   CBC - Comprehensive metabolic panel  2. HTN (hypertension), benign  - amLODipine (NORVASC) 10 MG tablet; TAKE 1 TABLET(10 MG) BY MOUTH DAILY  Dispense: 30 tablet; Refill: 11   Follow up:  Follow up in 2 months
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
## 2021-09-05 ENCOUNTER — Ambulatory Visit (INDEPENDENT_AMBULATORY_CARE_PROVIDER_SITE_OTHER): Payer: Self-pay | Admitting: Nurse Practitioner

## 2021-09-05 DIAGNOSIS — Z0000 Encounter for general adult medical examination without abnormal findings: Secondary | ICD-10-CM

## 2021-09-05 LAB — COMPREHENSIVE METABOLIC PANEL
ALT: 19 IU/L (ref 0–44)
AST: 15 IU/L (ref 0–40)
Albumin/Globulin Ratio: 1.5 (ref 1.2–2.2)
Albumin: 4.5 g/dL (ref 3.8–4.9)
Alkaline Phosphatase: 96 IU/L (ref 44–121)
BUN/Creatinine Ratio: 11 (ref 9–20)
BUN: 14 mg/dL (ref 6–24)
Bilirubin Total: 0.4 mg/dL (ref 0.0–1.2)
CO2: 24 mmol/L (ref 20–29)
Calcium: 9 mg/dL (ref 8.7–10.2)
Chloride: 102 mmol/L (ref 96–106)
Creatinine, Ser: 1.32 mg/dL — ABNORMAL HIGH (ref 0.76–1.27)
Globulin, Total: 3 g/dL (ref 1.5–4.5)
Glucose: 166 mg/dL — ABNORMAL HIGH (ref 70–99)
Potassium: 4.1 mmol/L (ref 3.5–5.2)
Sodium: 140 mmol/L (ref 134–144)
Total Protein: 7.5 g/dL (ref 6.0–8.5)
eGFR: 64 mL/min/{1.73_m2} (ref >59)

## 2021-09-05 LAB — CBC
Hematocrit: 43.1 % (ref 37.5–51.0)
Hemoglobin: 14.8 g/dL (ref 13.0–17.7)
MCH: 30 pg (ref 26.6–33.0)
MCHC: 34.3 g/dL (ref 31.5–35.7)
MCV: 87 fL (ref 79–97)
Platelets: 208 10*3/uL (ref 150–450)
RBC: 4.93 x10E6/uL (ref 4.14–5.80)
RDW: 14.3 % (ref 11.6–15.4)
WBC: 7.3 10*3/uL (ref 3.4–10.8)

## 2021-09-05 LAB — POCT GLYCOSYLATED HEMOGLOBIN (HGB A1C)
HbA1c POC (<> result, manual entry): 7 % (ref 4.0–5.6)
HbA1c, POC (controlled diabetic range): 7 % (ref 0.0–7.0)
HbA1c, POC (prediabetic range): 7 % — AB (ref 5.7–6.4)
Hemoglobin A1C: 7 % — AB (ref 4.0–5.6)

## 2021-09-08 ENCOUNTER — Ambulatory Visit: Payer: Self-pay | Admitting: Nurse Practitioner

## 2021-09-10 ENCOUNTER — Ambulatory Visit: Payer: Self-pay | Admitting: Nurse Practitioner

## 2021-09-22 NOTE — Telephone Encounter (Signed)
Error

## 2021-09-25 ENCOUNTER — Other Ambulatory Visit: Payer: Self-pay

## 2021-10-30 ENCOUNTER — Other Ambulatory Visit: Payer: Self-pay

## 2021-11-07 ENCOUNTER — Encounter: Payer: Self-pay | Admitting: Nurse Practitioner

## 2021-11-07 ENCOUNTER — Ambulatory Visit (INDEPENDENT_AMBULATORY_CARE_PROVIDER_SITE_OTHER): Payer: Self-pay | Admitting: Nurse Practitioner

## 2021-11-07 VITALS — BP 126/92 | HR 67 | Temp 97.5°F | Ht 66.0 in | Wt 190.0 lb

## 2021-11-07 DIAGNOSIS — E1165 Type 2 diabetes mellitus with hyperglycemia: Secondary | ICD-10-CM

## 2021-11-07 LAB — POCT GLYCOSYLATED HEMOGLOBIN (HGB A1C)
HbA1c POC (<> result, manual entry): 7.8 % (ref 4.0–5.6)
HbA1c, POC (controlled diabetic range): 7.8 % — AB (ref 0.0–7.0)
HbA1c, POC (prediabetic range): 7.8 % — AB (ref 5.7–6.4)
Hemoglobin A1C: 7.8 % — AB (ref 4.0–5.6)

## 2021-11-07 NOTE — Assessment & Plan Note (Signed)
-   POCT glycosylated hemoglobin (Hb A1C) Lab Results  Component Value Date   HGBA1C 7.8 (A) 11/07/2021   HGBA1C 7.8 11/07/2021   HGBA1C 7.8 (A) 11/07/2021   HGBA1C 7.8 (A) 11/07/2021   - will consult with pharmacy for best regimen - may add Jardiance   Follow up:  Follow up in 3 months or sooner
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
## 2021-11-07 NOTE — Patient Instructions (Signed)
1. Uncontrolled type 2 diabetes mellitus with hyperglycemia (HCC)  - POCT glycosylated hemoglobin (Hb A1C) Lab Results  Component Value Date   HGBA1C 7.8 (A) 11/07/2021   HGBA1C 7.8 11/07/2021   HGBA1C 7.8 (A) 11/07/2021   HGBA1C 7.8 (A) 11/07/2021   - will consult with pharmacy for best regimen - may add Jardiance   Follow up:  Follow up in 3 months or sooner

## 2021-11-10 ENCOUNTER — Other Ambulatory Visit: Payer: Self-pay

## 2021-11-10 ENCOUNTER — Other Ambulatory Visit: Payer: Self-pay | Admitting: Pharmacist

## 2021-11-10 ENCOUNTER — Ambulatory Visit: Payer: Self-pay

## 2021-11-10 DIAGNOSIS — E1165 Type 2 diabetes mellitus with hyperglycemia: Secondary | ICD-10-CM

## 2021-11-10 MED ORDER — EMPAGLIFLOZIN 10 MG PO TABS
10.0000 mg | ORAL_TABLET | Freq: Every day | ORAL | 2 refills | Status: DC
  Filled 2021-11-10: qty 90, 90d supply, fill #0

## 2021-11-10 NOTE — Progress Notes (Signed)
Chief Complaint  Patient presents with   Diabetes    Donald Wheeler is a 51 y.o. year old male who presented for a telephone visit.   They were referred to the pharmacist by their PCP for assistance in managing diabetes.   Subjective:  Care Team: Primary Care Provider: Fenton Foy, NP ; Next Scheduled Visit: 02/11/22  Medication Access/Adherence  Current Pharmacy:  [facility] [place], [address] [address] Phone: [phone] Fax: [phone]  [place] [facility] [place] [address], [place] [address] Phone: [phone] Fax: [place] [address] [place] [address] Phone: [phone] Fax: [place] [address] Phone: [phone] Fax: [phone]   Patient reports affordability concerns with their medications: No  Patient reports access/transportation concerns to their pharmacy: No  Patient reports adherence concerns with their medications:  Yes  reports he sometimes takes his medications in the morning, sometimes in the afternoon. Notes he takes all medications together at once, so is taking metformin once daily   Diabetes:  Current medications: metformin 1000 mg twice daily - though generally only taking once daily Medications tried in the past: Levemir  Current meal patterns:  - Breakfast: sometimes cereal, sometimes sausage biscuit; water - Lunch: sometimes skips,  - Supper: aunt cooks - baked foods, reports he likes vegetables  Reports that he does some work, did not elaborate on typical routine.   Hypertension:  Current medications: amlodipine 10 mg daily, losartan/HCTZ 50/12.5 mg daily   Patient does not have a validated, automated, upper arm home BP cuff. Cannot  afford to purchase one.    Hyperlipidemia/ASCVD Risk Reduction  Current lipid lowering medications: atorvastatin 80 mg daily   Health Maintenance  Health Maintenance Due  Topic Date Due   Zoster Vaccines- Shingrix (1 of 2) Never done   COVID-19 Vaccine (4 - Pfizer risk series) 03/01/2020   OPHTHALMOLOGY EXAM  08/27/2021   INFLUENZA VACCINE  10/14/2021     Objective: Lab Results  Component Value Date   HGBA1C 7.8 (A) 11/07/2021   HGBA1C 7.8 11/07/2021   HGBA1C 7.8 (A) 11/07/2021   HGBA1C 7.8 (A) 11/07/2021    Lab Results  Component Value Date   CREATININE 1.32 (H) 09/04/2021   BUN 14 09/04/2021   NA 140 09/04/2021   K 4.1 09/04/2021   CL 102 09/04/2021   CO2 24 09/04/2021    Lab Results  Component Value Date   CHOL 150 06/10/2021   HDL 35 (L) 06/10/2021   LDLCALC 90 06/10/2021   LDLDIRECT 124 (H) 08/16/2017   TRIG 139 06/10/2021   CHOLHDL 4.3 06/10/2021    Medications Reviewed Today     Reviewed by Osker Mason, RPH-CPP (Pharmacist) on 11/10/21 at 1305  Med List Status: <None>   Medication Order Taking? Sig Documenting Provider Last Dose Status Informant  amLODipine (NORVASC) 10 MG tablet [id number] Yes TAKE 1 TABLET(10 MG) BY MOUTH DAILY Fenton Foy, NP Taking Active   ASPIRIN LOW DOSE 81 MG EC tablet [id number] Yes TAKE 1 TABLET(81 MG) BY MOUTH DAILY Vevelyn Francois, NP Taking Active   atorvastatin (LIPITOR) 80 MG tablet [id number] Yes Take 1 tablet (80 mg total) by mouth daily. TAKE 1 TABLET BY MOUTH DAILY Vevelyn Francois, NP Taking Active  BIKTARVY 50-200-25 MG TABS tablet [id number] Yes TAKE 1 TABLET BY MOUTH DAILY Campbell Riches, MD Taking Active   blood glucose meter kit and supplies [id number]  Dispense based on patient and insurance preference. Use up to four times daily as directed. (FOR ICD-10 E10.9, E11.9). Fenton Foy, NP  Active   blood glucose meter kit and supplies KIT [id number]  Dispense based on patient and insurance  preference. Use up to four times daily as directed. Bo Merino I, NP  Active   losartan-hydrochlorothiazide (HYZAAR) 50-12.5 MG tablet [id number] Yes Take 1 tablet by mouth daily. Vevelyn Francois, NP Taking Active   metFORMIN (GLUCOPHAGE) 1000 MG tablet [id number] Yes Take 1 tablet (1,000 mg total) by mouth 2 (two) times daily with a meal. Fenton Foy, NP Taking Active               Assessment/Plan:   Diabetes: - Currently uncontrolled - As per referral information from PCP, recommend initiating Jardiance with Patient Assistance Program support from Pharmacy at [facility].  - Reviewed lifestyle modifications including: small portion sizes of carbohydrates - Discussed SGLT2. Counseled on side effects, mechanisms of action, sick day dosing  - Start Jardiance 10 mg daily. Will collaborate with pharmacy for PAP application - Moving forward, consider switching metformin to XR formulation to be able to dose once dialy   Hypertension: - Currently uncontrolled - Recommend to always take blood pressure medications before clinic visits - Patient will have BP checked tomorrow at [facility] pharmacy.  - Continue current regimen at this time   Hyperlipidemia/ASCVD Risk Reduction: - Currently uncontrolled.  - Will discuss adherence moving forward. Recommend lipid recheck with next labs  Follow Up Plan: phone call later this week  Catie TJodi Mourning, PharmD, [facility] [phone]

## 2021-11-12 ENCOUNTER — Other Ambulatory Visit: Payer: Self-pay | Admitting: Pharmacist

## 2021-11-13 ENCOUNTER — Other Ambulatory Visit: Payer: Self-pay | Admitting: Pharmacist

## 2021-11-13 ENCOUNTER — Other Ambulatory Visit: Payer: Self-pay

## 2021-11-13 DIAGNOSIS — E1165 Type 2 diabetes mellitus with hyperglycemia: Secondary | ICD-10-CM

## 2021-11-13 MED ORDER — EMPAGLIFLOZIN 10 MG PO TABS: 10.0000 mg | ORAL_TABLET | Freq: Every day | ORAL | 3 refills | Status: DC

## 2021-11-14 NOTE — Progress Notes (Signed)
Care Coordination Call  Assisted in signing patient up for Jardiance savings card. Designer, fashion/clothing. They received script but did not add copay card information. They ran this morning with copay card but said the card wasn't activated. Called the [place] card help desk. They gave me new card information:  New ID: [id number]  Called [facility] back, provided new ID. Copay ran through for $10. Called patient, he notes he can afford this.   Follow up in 4 weeks.  Catie Eppie Gibson, PharmD, [facility] [phone]

## 2021-12-01 ENCOUNTER — Other Ambulatory Visit: Payer: Self-pay

## 2021-12-10 ENCOUNTER — Other Ambulatory Visit: Payer: Self-pay | Admitting: Pharmacist

## 2021-12-18 ENCOUNTER — Other Ambulatory Visit: Payer: Self-pay | Admitting: Pharmacist

## 2021-12-19 ENCOUNTER — Other Ambulatory Visit: Payer: Self-pay | Admitting: Pharmacist

## 2021-12-19 ENCOUNTER — Ambulatory Visit: Payer: Self-pay

## 2021-12-19 DIAGNOSIS — E1165 Type 2 diabetes mellitus with hyperglycemia: Secondary | ICD-10-CM

## 2021-12-19 NOTE — Patient Outreach (Signed)
  Care Coordination   12/19/2021 Name: Donald Wheeler MRN: [id number] DOB: 02/08/1971   Care Coordination Outreach Attempts:  An unsuccessful telephone outreach was attempted today to offer the patient information about available care coordination services as a benefit of their health plan.   Follow Up Plan:  Additional outreach attempts will be made to offer the patient care coordination information and services.   Encounter Outcome:  No Answer  Care Coordination Interventions Activated:  No   Care Coordination Interventions:  No, not indicated    Daneen Schick, BSW, CDP Social Worker, Certified Dementia Practitioner [facility] Care Management  Care Coordination [phone]

## 2021-12-19 NOTE — Progress Notes (Signed)
12/19/2021 Name: Donald Wheeler MRN: [id number] DOB: [birth year]  Chief Complaint  Patient presents with   Diabetes   Medication Management    Donald Wheeler is a 51 y.o. year old male who presented for a telephone visit.   They were referred to the pharmacist by their PCP for assistance in managing diabetes.    Subjective:  Care Team: Primary Care Provider: Fenton Foy, NP ; Next Scheduled Visit: 02/11/22  Medication Access/Adherence  Current Pharmacy:  [facility] [place], [place] [address] Phone: [phone] Fax: [phone]  [place] [address] [place] [address] Phone: [phone] Fax: [place] [address] [place] [address] Phone: [phone] Fax: [place] [address] Phone: [phone] Fax: [phone]   Patient reports affordability concerns with their medications: Yes  Patient reports access/transportation concerns to their pharmacy: No  Patient reports adherence concerns with their medications:  No     Diabetes:  Current medications: metformin 1000 mg twice daily, Jardiance 10 mg daily - patient reports he has not picked this medication up yet  Current glucose readings: not checking, he is not interested in sticking himself   Health Maintenance  Health Maintenance Due  Topic Date Due   Zoster Vaccines- Shingrix (1 of 2) Never done   COVID-19 Vaccine (4 - Pfizer risk series) 03/01/2020   Diabetic kidney evaluation - Urine ACR  12/31/2020   OPHTHALMOLOGY EXAM  08/27/2021   INFLUENZA VACCINE  10/14/2021     Objective: Lab Results  Component Value Date   HGBA1C 7.8 (A) 11/07/2021   HGBA1C 7.8 11/07/2021   HGBA1C 7.8 (A)  11/07/2021   HGBA1C 7.8 (A) 11/07/2021    Lab Results  Component Value Date   CREATININE 1.32 (H) 09/04/2021   BUN 14 09/04/2021   NA 140 09/04/2021   K 4.1 09/04/2021   CL 102 09/04/2021   CO2 24 09/04/2021    Lab Results  Component Value Date   CHOL 150 06/10/2021   HDL 35 (L) 06/10/2021   LDLCALC 90 06/10/2021   LDLDIRECT 124 (H) 08/16/2017   TRIG 139 06/10/2021   CHOLHDL 4.3 06/10/2021    Medications Reviewed Today     Reviewed by Osker Mason, RPH-CPP (Pharmacist) on 12/19/21 at [place] List Status: <None>   Medication Order Taking? Sig Documenting Provider Last Dose Status Informant  amLODipine (NORVASC) 10 MG tablet [id number] Yes TAKE 1 TABLET(10 MG) BY MOUTH DAILY Fenton Foy, NP Taking Active   ASPIRIN LOW DOSE 81 MG EC tablet [id number] Yes TAKE 1 TABLET(81 MG) BY MOUTH DAILY Vevelyn Francois, NP Taking Active   atorvastatin (LIPITOR) 80 MG tablet [id number] Yes Take 1 tablet (80 mg total) by mouth daily. TAKE 1 TABLET BY MOUTH DAILY Vevelyn Francois, NP Taking Active   BIKTARVY 50-200-25 MG TABS tablet [id number] Yes TAKE 1 TABLET BY MOUTH DAILY Campbell Riches, MD Taking Active   blood glucose meter kit and supplies [id number]  Dispense based on patient and insurance preference. Use up to four times daily as directed. (FOR ICD-10 E10.9, E11.9). Fenton Foy, NP  Active   blood glucose meter kit and supplies KIT [id number]  Dispense based on patient and insurance preference.  Use up to four times daily as directed. Bo Merino I, NP  Active   empagliflozin (JARDIANCE) 10 MG TABS tablet [id number] No Take 1 tablet (10 mg total) by mouth daily before breakfast.  Patient not taking: Reported on 12/19/2021   Fenton Foy, NP Not Taking Active   losartan-hydrochlorothiazide [facility]) 50-12.5 MG tablet [id number] Yes Take 1 tablet by mouth daily. Vevelyn Francois, NP Taking Active   metFORMIN (GLUCOPHAGE) 1000 MG tablet [id number] Yes Take 1 tablet (1,000  mg total) by mouth 2 (two) times daily with a meal. Fenton Foy, NP Taking Active               Assessment/Plan:   Diabetes: - Currently uncontrolled - Contacted [facility]. They confirm that the copay for the Jardiance is $10. Called patient back. He notes that he cannot afford that right now. Discussed referral to Care Guide for financial resource assistance.   Follow Up Plan: phone call in ~1 week  Catie TJodi Mourning, PharmD, [facility] [phone]

## 2021-12-22 ENCOUNTER — Ambulatory Visit: Payer: Self-pay

## 2021-12-22 NOTE — Patient Outreach (Signed)
  Care Coordination   Initial Visit Note   12/22/2021 Name: Donald Wheeler MRN: [id number] DOB: [birth year]  Donald Wheeler is a 51 y.o. year old male who sees Fenton Foy, NP for primary care. I spoke with  [place] Callas by phone today.  What matters to the patients health and wellness today?  To obtain food resources    Goals Addressed             This Visit's Progress    COMPLETED: Care Coordination Activities       Care Coordination Interventions: Collaboration with PharmD Catie Darnelle Maffucci who requests SW assistance with SDoH needs Performed SDoH screen to determine the patient is in need of assistance with food insecurity Determined the patient lives with his aunt and is not currently working. Patient does report he is actively seeking employment Discussed plan for SW to mail the patient a Food and Nutrition Application as well as a lit of food pantry sites Advised the patient PharmD will continue to outreach regarding concerns with medication costs         SDOH assessments and interventions completed:  Yes  SDOH Interventions Today    Flowsheet Row Most Recent Value  SDOH Interventions   Food Insecurity Interventions Assist with SNAP Application  Housing Interventions Intervention Not Indicated  Transportation Interventions Intervention Not Indicated  Utilities Interventions Intervention Not Indicated        Care Coordination Interventions Activated:  Yes  Care Coordination Interventions:  Yes, provided   Follow up plan: No further intervention required. Patient will continue to work with PharmD regarding medication costs.  Encounter Outcome:  Pt. Visit Completed   Daneen Schick, BSW, CDP Social Worker, Certified Dementia Practitioner [facility]  Care Coordination [phone]

## 2021-12-22 NOTE — Patient Instructions (Signed)
Visit Information  Thank you for taking time to visit with me today. Please don't hesitate to contact me if I can be of assistance to you.   Following are the goals we discussed today:   Goals Addressed             This Visit's Progress    COMPLETED: Care Coordination Activities       Care Coordination Interventions: Collaboration with PharmD Catie Darnelle Maffucci who requests SW assistance with SDoH needs Performed SDoH screen to determine the patient is in need of assistance with food insecurity Determined the patient lives with his aunt and is not currently working. Patient does report he is actively seeking employment Discussed plan for SW to mail the patient a Food and Nutrition Application as well as a lit of food pantry sites Advised the patient PharmD will continue to outreach regarding concerns with medication costs         If you are experiencing a Mental Health or [place] or need someone to talk to, please go to [facility] [place] [phone])  The patient verbalized understanding of instructions, educational materials, and care plan provided today and DECLINED offer to receive copy of patient instructions, educational materials, and care plan.   No further follow up required: Please contact me as needed.  Daneen Schick, BSW, CDP Social Worker, Certified Dementia Practitioner [facility]  Care Coordination [phone]

## 2022-01-06 ENCOUNTER — Other Ambulatory Visit: Payer: Self-pay

## 2022-01-08 ENCOUNTER — Ambulatory Visit: Payer: Self-pay

## 2022-01-08 ENCOUNTER — Other Ambulatory Visit: Payer: Self-pay

## 2022-01-08 ENCOUNTER — Other Ambulatory Visit (HOSPITAL_COMMUNITY): Payer: Self-pay

## 2022-01-12 ENCOUNTER — Other Ambulatory Visit: Payer: Self-pay | Admitting: Pharmacist

## 2022-01-12 DIAGNOSIS — B20 Human immunodeficiency virus [HIV] disease: Secondary | ICD-10-CM

## 2022-01-12 MED ORDER — BIKTARVY 50-200-25 MG PO TABS: 1.0000 | ORAL_TABLET | Freq: Every day | ORAL | 0 refills | Status: AC

## 2022-01-12 NOTE — Progress Notes (Signed)
Medication Samples have been provided to the patient.  Drug name: Biktarvy        Strength: 50/200/25 mg       Qty: 14 tablets (2 bottles)   LOT: [id number]   Exp.Date: 11/2023  Dosing instructions: Take one tablet by mouth once daily  The patient has been instructed regarding the correct time, dose, and frequency of taking this medication, including desired effects and most common side effects.   Kashauna Celmer L. Landers Prajapati, PharmD, BCIDP, AAHIVP, CPP Clinical Pharmacist Practitioner Infectious Diseases Clinical Pharmacist [facility] 02/26/2020, 10:07 AM  

## 2022-01-19 ENCOUNTER — Ambulatory Visit: Payer: Self-pay | Admitting: Internal Medicine

## 2022-01-20 ENCOUNTER — Other Ambulatory Visit: Payer: Self-pay

## 2022-01-20 ENCOUNTER — Other Ambulatory Visit (HOSPITAL_COMMUNITY): Payer: Self-pay

## 2022-01-20 ENCOUNTER — Other Ambulatory Visit (HOSPITAL_COMMUNITY)
Admission: RE | Admit: 2022-01-20 | Discharge: 2022-01-20 | Disposition: A | Discharge status: Home / Self Care | Payer: Commercial Managed Care - HMO | Source: Ambulatory Visit | Attending: Internal Medicine | Admitting: Internal Medicine

## 2022-01-20 ENCOUNTER — Telehealth: Payer: Self-pay

## 2022-01-20 ENCOUNTER — Ambulatory Visit (INDEPENDENT_AMBULATORY_CARE_PROVIDER_SITE_OTHER): Payer: Commercial Managed Care - HMO | Admitting: Pharmacist

## 2022-01-20 ENCOUNTER — Other Ambulatory Visit: Payer: Self-pay | Admitting: Pharmacist

## 2022-01-20 DIAGNOSIS — Z202 Contact with and (suspected) exposure to infections with a predominantly sexual mode of transmission: Secondary | ICD-10-CM | POA: Diagnosis not present

## 2022-01-20 DIAGNOSIS — Z113 Encounter for screening for infections with a predominantly sexual mode of transmission: Secondary | ICD-10-CM

## 2022-01-20 MED ORDER — CEFTRIAXONE SODIUM 500 MG IJ SOLR
500.0000 mg | Freq: Once | INTRAMUSCULAR | Status: AC
  Administered 2022-01-20: 500 mg via INTRAMUSCULAR

## 2022-01-20 MED ORDER — DOXYCYCLINE HYCLATE 100 MG PO TABS
100.0000 mg | ORAL_TABLET | Freq: Two times a day (BID) | ORAL | 0 refills | Status: DC
  Filled 2022-01-20: qty 14, 7d supply, fill #0

## 2022-01-20 NOTE — Telephone Encounter (Signed)
[facility] Patient Advocate Encounter   Was successful in obtaining a [employer] copay card for Biktarvy.  This copay card will make the patients copay $0.00.  I have spoken with the patient.         Janelli Welling, CPhT Specialty Pharmacy Patient Advocate [facility] Phone: [phone] Fax:  [phone]  

## 2022-01-20 NOTE — Progress Notes (Unsigned)
Date:  01/20/2022   HPI: Donald Wheeler is a 51 y.o. male who presents to the [facility] clinic for STI testing.  Insured   _0    Uninsured  _1    Patient Active Problem List   Diagnosis Date Noted   Uncontrolled type 2 diabetes mellitus with hyperglycemia ([name]) 08/04/2021   Healthcare maintenance 02/19/2021   PSA elevation 02/05/2021   Persistent microscopic hematuria 02/05/2021   Controlled type 2 diabetes mellitus with microalbuminuria, with long-term current use of insulin ([name]) 08/09/2019   Vitamin D deficiency 05/10/2019   Shingles 07/2018   Hyperosmolar non-ketotic state in patient with type 2 diabetes mellitus ([place]) 12/23/2017   Diabetes mellitus, new onset ([name]) 12/23/2017   History of hematuria 08/16/2017   Mixed hyperlipidemia 06/16/2017   Cerebral thrombosis with cerebral infarction 04/10/2017   Solitary pulmonary nodule 04/10/2017   Focal neurological deficit 04/09/2017   CKD (chronic kidney disease) stage 3, GFR 30-59 ml/min 04/09/2017   Smoker 04/09/2017   HTN (hypertension), benign 09/07/2013   HIV (human immunodeficiency virus infection) ([name]) 03/17/2012    Patient's Medications  New Prescriptions   No medications on file  Previous Medications   AMLODIPINE (NORVASC) 10 MG TABLET    TAKE 1 TABLET(10 MG) BY MOUTH DAILY   ASPIRIN LOW DOSE 81 MG EC TABLET    TAKE 1 TABLET(81 MG) BY MOUTH DAILY   ATORVASTATIN (LIPITOR) 80 MG TABLET    Take 1 tablet (80 mg total) by mouth daily. TAKE 1 TABLET BY MOUTH DAILY   BICTEGRAVIR-EMTRICITABINE-TENOFOVIR AF (BIKTARVY) 50-200-25 MG TABS TABLET    Take 1 tablet by mouth daily for 14 days.   BIKTARVY 50-200-25 MG TABS TABLET    TAKE 1 TABLET BY MOUTH DAILY   BLOOD GLUCOSE METER KIT AND SUPPLIES    Dispense based on patient and insurance preference. Use up to four times daily as directed. (FOR ICD-10 E10.9, E11.9).   BLOOD GLUCOSE METER KIT AND SUPPLIES KIT    Dispense based on patient and insurance preference. Use up to four  times daily as directed.   EMPAGLIFLOZIN (JARDIANCE) 10 MG TABS TABLET    Take 1 tablet (10 mg total) by mouth daily before breakfast.   LOSARTAN-HYDROCHLOROTHIAZIDE (HYZAAR) 50-12.5 MG TABLET    Take 1 tablet by mouth daily.   METFORMIN (GLUCOPHAGE) 1000 MG TABLET    Take 1 tablet (1,000 mg total) by mouth 2 (two) times daily with a meal.  Modified Medications   No medications on file  Discontinued Medications   No medications on file    Allergies: No Known Allergies  Past Medical History: Past Medical History:  Diagnosis Date   Bell's palsy 09/07/2013   Diabetes mellitus without complication ([name]) 78/2956   NEW ONSET    HIV (human immunodeficiency virus infection) ([name])    dx'd 2000s   Hypertension    Stroke ([name]) 04/09/2017   vs TIA w/acute onset of right-sided weakness as well as trouble speaking/notes 04/09/2017    Social History: Social History   Socioeconomic History   Marital status: Divorced    Spouse name: Not on file   Number of children: Not on file   Years of education: Not on file   Highest education level: Not on file  Occupational History   Not on file  Tobacco Use   Smoking status: Some Days    Packs/day: 0.20    Years: 15.00    Total pack years: 3.00    Types: Cigars, Cigarettes  Last attempt to quit: 12/12/2017    Years since quitting: 4.1   Smokeless tobacco: Never   Tobacco comments:    black and mildsl daily  Vaping Use   Vaping Use: Never used  Substance and Sexual Activity   Alcohol use: Yes    Comment: 04/09/2017 "might have 2 beers/month; if that"   Drug use: Not Currently    Types: Marijuana   Sexual activity: Not Currently    Partners: Female    Birth control/protection: None, Condom    Comment: declined  condoms  Other Topics Concern   Not on file  Social History Narrative   Not on file   Social Determinants of Health   Financial Resource Strain: Not on file  Food Insecurity: Food Insecurity Present (12/22/2021)   Hunger  Vital Sign    Worried About Running Out of Food in the Last Year: Sometimes true    Ran Out of Food in the Last Year: Sometimes true  Transportation Needs: No Transportation Needs (12/22/2021)   PRAPARE - Hydrologist (Medical): No    Lack of Transportation (Non-Medical): No  Physical Activity: Not on file  Stress: Not on file  Social Connections: Not on file        No data to display          Labs:  SCr: Lab Results  Component Value Date   CREATININE 1.32 (H) 09/04/2021   CREATININE 1.67 (H) 07/22/2021   CREATININE 1.70 (H) 02/19/2021   CREATININE 1.32 (H) 01/06/2021   CREATININE 1.51 (H) 07/15/2020   HIV No results found for: "HIV" Hepatitis B Lab Results  Component Value Date   HEPBSAB NONREACTIVE 03/03/2012   HEPBSAG NEGATIVE 03/03/2012   HEPBCAB NEG 03/03/2012   Hepatitis C No results found for: "HEPCAB", "HCVRNAPCRQN" Hepatitis A Lab Results  Component Value Date   HAV NEG 03/03/2012   RPR and STI Lab Results  Component Value Date   LABRPR NON-REACTIVE 07/22/2021   LABRPR NON-REACTIVE 02/19/2021   LABRPR NON-REACTIVE 07/15/2020   LABRPR NON-REACTIVE 08/08/2019   LABRPR NON-REACTIVE 07/20/2018    STI Results GC CT  08/08/2019  2:36 PM Negative  Negative   07/20/2018 12:00 AM Negative  Negative   04/27/2016 12:00 AM Negative  Negative   04/12/2014 12:00 AM NG: Negative  **CT: POSITIVE**     Assessment: 16 YOM who follows with Dr. Baxter Flattery for HIV management on [place]- well controlled. Patient reports he believes he may have had a condom break during sexual activity this week, but condoms appeared to be intact when they were removed. He reports having sexual intercourse with 3 different women on Friday, Saturday, and Sunday, 2 of which were new partners. He is unaware of whether or not any of these women may have been exposed to any STIs. He states his sexual activity was limited to him being the insertive partner in vaginal  intercourse. He did not participate in oral or rectal sex. He states he has been experiencing tingling and urethral discharge that he describes as more mucoid/watery since 01/19/22. He reports he has not had difficulty urinating.    Patient reports no allergies to any medications, including penicillins. Considering his recent sexual activity with new partners and described symptoms, it was decided to treat the patient for presumed STI during today's visit. Ceftriaxone 500 mg IM x1 was administered in the left upper outer quadrant of the gluteal muscle and was tolerated without issue. Patient was also prescribed doxycycline  100 mg BID x7d for chlamydia treatment. Discussed side effects of this medication, including GI upset that may be mitigated by taking the medication with food, esophageal irritation which may be mitigated by taking the medication with a full glass of water and staying upright for 30 mins after taking the medication, and the potential for photosensitivity. Patient voiced understanding. Patient stated he wished to have his doxycycline prescription sent to [facility] at [employer]. Counseled patient to abstain from sexual activity for at least 10d following the injection.   Patient offered condoms during today's visit and accepted.     Plan: Ceftriaxone 500 mg IM x1 administered in clinic  Doxycycline 100 mg BID x7d sent to [facility] pharmacy at [employer]  F/U RPR and urine cytology  Next appointment scheduled for 01/26/22 with Dr. Tana Felts, PharmD PGY-2 Infectious Diseases Resident  01/20/2022 1:23 PM

## 2022-01-20 NOTE — Telephone Encounter (Signed)
Patient walked into clinic today requesting to speak to a nurse regarding some personal concerns. Spoke with patient who states this past weekend he had sexual encounter and condom broke. Noticed on Monday that he is having penile discharge and penile tingling. Would like to do STD testing/ treatment.  Scheduled to see pharmacy team later today. Advised he refrain from all sexual encounters until results/ treatment are given. Understands he will need to inform recent partners to get tested/ treated.  Leatrice Jewels, RMA

## 2022-01-21 LAB — RPR: RPR Ser Ql: NONREACTIVE

## 2022-01-22 LAB — URINE CYTOLOGY ANCILLARY ONLY
Chlamydia: NEGATIVE
Comment: NEGATIVE
Comment: NORMAL
Neisseria Gonorrhea: POSITIVE — AB

## 2022-01-26 ENCOUNTER — Other Ambulatory Visit: Payer: Self-pay

## 2022-01-26 ENCOUNTER — Ambulatory Visit (INDEPENDENT_AMBULATORY_CARE_PROVIDER_SITE_OTHER): Payer: Commercial Managed Care - HMO | Admitting: Internal Medicine

## 2022-01-26 ENCOUNTER — Encounter: Payer: Self-pay | Admitting: Internal Medicine

## 2022-01-26 VITALS — BP 124/78 | HR 87 | Temp 97.7°F | Ht 67.0 in | Wt 191.0 lb

## 2022-01-26 DIAGNOSIS — I129 Hypertensive chronic kidney disease with stage 1 through stage 4 chronic kidney disease, or unspecified chronic kidney disease: Secondary | ICD-10-CM

## 2022-01-26 DIAGNOSIS — N183 Chronic kidney disease, stage 3 unspecified: Secondary | ICD-10-CM

## 2022-01-26 DIAGNOSIS — B20 Human immunodeficiency virus [HIV] disease: Secondary | ICD-10-CM

## 2022-01-26 DIAGNOSIS — I10 Essential (primary) hypertension: Secondary | ICD-10-CM

## 2022-01-26 DIAGNOSIS — Z23 Encounter for immunization: Secondary | ICD-10-CM

## 2022-01-26 NOTE — Progress Notes (Signed)
Patient ID: Donald Wheeler, male   DOB: [birth year], 51 y.o.   MRN: [id number]  HPI Donald Wheeler is a 51yo M with well controlled hiv disease, on biktarvy. Treated for sti last week treated with 7 days of doxy. Otherwise doing well.  Sochx: smoking black and mal.. Outpatient Encounter Medications as of 01/26/2022  Medication Sig   amLODipine (NORVASC) 10 MG tablet TAKE 1 TABLET(10 MG) BY MOUTH DAILY   ASPIRIN LOW DOSE 81 MG EC tablet TAKE 1 TABLET(81 MG) BY MOUTH DAILY   atorvastatin (LIPITOR) 80 MG tablet Take 1 tablet (80 mg total) by mouth daily. TAKE 1 TABLET BY MOUTH DAILY   BIKTARVY 50-200-25 MG TABS tablet TAKE 1 TABLET BY MOUTH DAILY   blood glucose meter kit and supplies KIT Dispense based on patient and insurance preference. Use up to four times daily as directed.   blood glucose meter kit and supplies Dispense based on patient and insurance preference. Use up to four times daily as directed. (FOR ICD-10 E10.9, E11.9).   losartan-hydrochlorothiazide (HYZAAR) 50-12.5 MG tablet Take 1 tablet by mouth daily.   metFORMIN (GLUCOPHAGE) 1000 MG tablet Take 1 tablet (1,000 mg total) by mouth 2 (two) times daily with a meal.   doxycycline (VIBRA-TABS) 100 MG tablet Take 1 tablet (100 mg total) by mouth 2 (two) times daily. (Patient not taking: Reported on 01/26/2022)   empagliflozin (JARDIANCE) 10 MG TABS tablet Take 1 tablet (10 mg total) by mouth daily before breakfast. (Patient not taking: Reported on 12/19/2021)   No facility-administered encounter medications on file as of 01/26/2022.     Patient Active Problem List   Diagnosis Date Noted   Uncontrolled type 2 diabetes mellitus with hyperglycemia ([place]) 08/04/2021   Healthcare maintenance 02/19/2021   PSA elevation 02/05/2021   Persistent microscopic hematuria 02/05/2021   Controlled type 2 diabetes mellitus with microalbuminuria, with long-term current use of insulin ([place]) 08/09/2019   Vitamin D deficiency 05/10/2019   Shingles  07/2018   Hyperosmolar non-ketotic state in patient with type 2 diabetes mellitus ([place]) 12/23/2017   Diabetes mellitus, new onset ([place]) 12/23/2017   History of hematuria 08/16/2017   Mixed hyperlipidemia 06/16/2017   Cerebral thrombosis with cerebral infarction 04/10/2017   Solitary pulmonary nodule 04/10/2017   Focal neurological deficit 04/09/2017   CKD (chronic kidney disease) stage 3, GFR 30-59 ml/min 04/09/2017   Smoker 04/09/2017   HTN (hypertension), benign 09/07/2013   HIV (human immunodeficiency virus infection) ([place]) 03/17/2012     Health Maintenance Due  Topic Date Due   Zoster Vaccines- Shingrix (1 of 2) Never done   COVID-19 Vaccine (4 - Pfizer risk series) 03/01/2020   Diabetic kidney evaluation - Urine ACR  12/31/2020   OPHTHALMOLOGY EXAM  08/27/2021   INFLUENZA VACCINE  10/14/2021     Review of Systems Review of Systems  Constitutional: Negative for fever, chills, diaphoresis, activity change, appetite change, fatigue and unexpected weight change.  HENT: Negative for congestion, sore throat, rhinorrhea, sneezing, trouble swallowing and sinus pressure.  Eyes: Negative for photophobia and visual disturbance.  Respiratory: Negative for cough, chest tightness, shortness of breath, wheezing and stridor.  Cardiovascular: Negative for chest pain, palpitations and leg swelling.  Gastrointestinal: Negative for nausea, vomiting, abdominal pain, diarrhea, constipation, blood in stool, abdominal distention and anal bleeding.  Genitourinary: Negative for dysuria, hematuria, flank pain and difficulty urinating.  Musculoskeletal: Negative for myalgias, back pain, joint swelling, arthralgias and gait problem.  Skin: Negative for color change, pallor, rash and wound.  Neurological: Negative for dizziness, tremors, weakness and light-headedness.  Hematological: Negative for adenopathy. Does not bruise/bleed easily.  Psychiatric/Behavioral: Negative for behavioral problems,  confusion, sleep disturbance, dysphoric mood, decreased concentration and agitation.   Physical Exam   BP (!) 152/68 Comment: Provider notified  Pulse 87   Temp 97.7 F (36.5 C) (Oral)   Ht '5\' 7"'$  (1.702 m)   Wt 191 lb (86.6 kg)   SpO2 97%   BMI 29.91 kg/m   Physical Exam  Constitutional: He is oriented to person, place, and time. He appears well-developed and well-nourished. No distress.  HENT:  Mouth/Throat: Oropharynx is clear and moist. No oropharyngeal exudate.  Cardiovascular: Normal rate, regular rhythm and normal heart sounds. Exam reveals no gallop and no friction rub.  No murmur heard.  Pulmonary/Chest: Effort normal and breath sounds normal. No respiratory distress. He has no wheezes.  Abdominal: Soft. Bowel sounds are normal. He exhibits no distension. There is no tenderness.  Lymphadenopathy:  He has no cervical adenopathy.  Neurological: He is alert and oriented to person, place, and time.  Skin: Skin is warm and dry. No rash noted. No erythema.  Psychiatric: He has a normal mood and affect. His behavior is normal.   Lab Results  Component Value Date   CD4TCELL 21 (L) 07/22/2021   Lab Results  Component Value Date   CD4TABS 488 07/22/2021   CD4TABS 554 02/19/2021   CD4TABS 454 07/15/2020   Lab Results  Component Value Date   HIV1RNAQUANT <20 DETECTED (A) 07/22/2021   Lab Results  Component Value Date   HEPBSAB NONREACTIVE 03/03/2012   Lab Results  Component Value Date   LABRPR NON-REACTIVE 01/20/2022    CBC Lab Results  Component Value Date   WBC 7.3 09/04/2021   RBC 4.93 09/04/2021   HGB 14.8 09/04/2021   HCT 43.1 09/04/2021   PLT 208 09/04/2021   MCV 87 09/04/2021   MCH 30.0 09/04/2021   MCHC 34.3 09/04/2021   RDW 14.3 09/04/2021   LYMPHSABS 2,566 07/22/2021   MONOABS 0.6 01/06/2021   EOSABS 107 07/22/2021    BMET Lab Results  Component Value Date   NA 140 09/04/2021   K 4.1 09/04/2021   CL 102 09/04/2021   CO2 24 09/04/2021    GLUCOSE 166 (H) 09/04/2021   BUN 14 09/04/2021   CREATININE 1.32 (H) 09/04/2021   CALCIUM 9.0 09/04/2021   GFRNONAA >60 01/06/2021   GFRAA 61 07/15/2020      Assessment and Plan  Hiv disease = defer blood draw next month. Continue with biktarvy  Htn = will repeat blood pressure; which is at goal  Ckd 3 = appears stable this summer  Health maintenace=  Flu now and covid booster later

## 2022-02-11 ENCOUNTER — Encounter: Payer: Self-pay | Admitting: Nurse Practitioner

## 2022-02-11 ENCOUNTER — Ambulatory Visit (INDEPENDENT_AMBULATORY_CARE_PROVIDER_SITE_OTHER): Payer: Commercial Managed Care - HMO | Admitting: Nurse Practitioner

## 2022-02-11 VITALS — BP 123/79 | HR 76 | Ht 67.0 in | Wt 194.0 lb

## 2022-02-11 DIAGNOSIS — E1165 Type 2 diabetes mellitus with hyperglycemia: Secondary | ICD-10-CM

## 2022-02-11 LAB — POCT GLYCOSYLATED HEMOGLOBIN (HGB A1C): Hemoglobin A1C: 7.3 % — AB (ref 4.0–5.6)

## 2022-02-11 NOTE — Assessment & Plan Note (Signed)
-   POCT glycosylated hemoglobin (Hb A1C)  -continue metformin  -diabetic diet   Follow up:  Follow up in 3 months

## 2022-02-11 NOTE — Progress Notes (Signed)
_0  ID: [name], male    DOB: [birth year], 51 y.o.   MRN: [id number]  Chief Complaint  Patient presents with   Diabetes    Diabetes --f/u    Referring provider: Fenton Foy, NP   HPI  Donald Wheeler 51 y.o. male  has a past medical history of Bell's palsy (09/07/2013), Diabetes mellitus without complication ([place]) (74/0814), HIV (human immunodeficiency virus infection) ([place]), Hypertension, and Stroke ([place]) (04/09/2017). To [facility] today for annual wellness physical.   Diabetes Mellitus: Patient presents for follow up of diabetes. Symptoms: none. Symptoms have been basically asymptomatic. Patient denies foot ulcerations, nausea, polydipsia, and polyuria.  Evaluation to date has been included: hemoglobin A1C.  Home sugars: patient does not check sugars. Treatment to date: no recent interventions. Endorses monitoring meals at home and walks intermittently throughout the day. just on metformin - wanted to stop insulin. Started on jardiance but has not been taking due to cost.   Hypertension: Patient here for follow-up of elevated blood pressure. He is exercising and is adherent to low salt diet.  Is not checking B/P at home. Cardiac symptoms none. Patient denies chest pain, claudication, and irregular heart beat.  Cardiovascular risk factors: diabetes mellitus, dyslipidemia, hypertension, and male gender. Use of agents associated with hypertension: none. 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 Denies any fever. Denies any fatigue, chest pain, shortness of breath, HA or dizziness. Denies any blurred vision, numbness or tingling.   No Known Allergies  Immunization History  Administered Date(s) Administered   Hepatitis A 02/22/2012   Hepatitis A, Adult 02/20/2013   Hepatitis B 02/22/2012, 03/17/2012   Influenza Split 03/17/2012   Influenza Whole 03/17/2019   Influenza,inj,Quad PF,6+ Mos 02/20/2013, 01/03/2014,  11/07/2018, 01/26/2022   Meningococcal Mcv4o 05/11/2016   PFIZER(Purple Top)SARS-COV-2 Vaccination 05/29/2019, 06/20/2019, 01/05/2020   PNEUMOCOCCAL CONJUGATE-20 07/22/2021   Pneumococcal Polysaccharide-23 03/17/2012   Td 02/22/2012   Tdap 02/22/2012    Past Medical History:  Diagnosis Date   Bell's palsy 09/07/2013   Diabetes mellitus without complication ([place]) 48/1856   NEW ONSET    HIV (human immunodeficiency virus infection) ([place])    dx'd 2000s   Hypertension    Stroke ([place]) 04/09/2017   vs TIA w/acute onset of right-sided weakness as well as trouble speaking/notes 04/09/2017    Tobacco History: Social History   Tobacco Use  Smoking Status Some Days   Packs/day: 0.20   Years: 15.00   Total pack years: 3.00   Types: Cigars, Cigarettes   Last attempt to quit: 12/12/2017   Years since quitting: 4.1  Smokeless Tobacco Never  Tobacco Comments   black and milds daily   Ready to quit: Not Answered Counseling given: Not Answered Tobacco comments: black and milds daily   Outpatient Encounter Medications as of 02/11/2022  Medication Sig   amLODipine (NORVASC) 10 MG tablet TAKE 1 TABLET(10 MG) BY MOUTH DAILY   ASPIRIN LOW DOSE 81 MG EC tablet TAKE 1 TABLET(81 MG) BY MOUTH DAILY   atorvastatin (LIPITOR) 80 MG tablet Take 1 tablet (80 mg total) by mouth daily. TAKE 1 TABLET BY MOUTH DAILY   BIKTARVY 50-200-25 MG TABS tablet TAKE 1 TABLET BY MOUTH DAILY   blood glucose meter kit and supplies KIT Dispense based on patient and insurance preference. Use up to four times daily as directed.   blood glucose meter kit and supplies Dispense based on patient and insurance preference. Use up to  four times daily as directed. (FOR ICD-10 E10.9, E11.9).   losartan-hydrochlorothiazide (HYZAAR) 50-12.5 MG tablet Take 1 tablet by mouth daily.   metFORMIN (GLUCOPHAGE) 1000 MG tablet Take 1 tablet (1,000 mg total) by mouth 2 (two) times daily with a meal.   empagliflozin (JARDIANCE) 10 MG TABS  tablet Take 1 tablet (10 mg total) by mouth daily before breakfast. (Patient not taking: Reported on 12/19/2021)   [DISCONTINUED] doxycycline (VIBRA-TABS) 100 MG tablet Take 1 tablet (100 mg total) by mouth 2 (two) times daily. (Patient not taking: Reported on 01/26/2022)   No facility-administered encounter medications on file as of 02/11/2022.     Review of Systems  Review of Systems  Constitutional: Negative.   HENT: Negative.    Cardiovascular: Negative.   Gastrointestinal: Negative.   Allergic/Immunologic: Negative.   Neurological: Negative.   Psychiatric/Behavioral: Negative.         Physical Exam  BP 123/79   Pulse 76   Ht _0  (1.702 m)   Wt 194 lb (88 kg)   SpO2 98%   BMI 30.38 kg/m   Wt Readings from Last 5 Encounters:  02/11/22 194 lb (88 kg)  01/26/22 191 lb (86.6 kg)  11/07/21 190 lb (86.2 kg)  09/04/21 189 lb 3.2 oz (85.8 kg)  07/28/21 191 lb (86.6 kg)     Physical Exam Vitals and nursing note reviewed.  Constitutional:      General: He is not in acute distress.    Appearance: He is well-developed.  Cardiovascular:     Rate and Rhythm: Normal rate and regular rhythm.  Pulmonary:     Effort: Pulmonary effort is normal.     Breath sounds: Normal breath sounds.  Skin:    General: Skin is warm and dry.  Neurological:     Mental Status: He is alert and oriented to person, place, and time.      Lab Results:  CBC    Component Value Date/Time   WBC 7.3 09/04/2021 0959   WBC 6.7 07/22/2021 0237   RBC 4.93 09/04/2021 0959   RBC 5.20 07/22/2021 0237   HGB 14.8 09/04/2021 0959   HCT 43.1 09/04/2021 0959   PLT 208 09/04/2021 0959   MCV 87 09/04/2021 0959   MCH 30.0 09/04/2021 0959   MCH 30.4 07/22/2021 0237   MCHC 34.3 09/04/2021 0959   MCHC 34.0 07/22/2021 0237   RDW 14.3 09/04/2021 0959   LYMPHSABS 2,566 07/22/2021 0237   LYMPHSABS 2.3 05/08/2019 1013   MONOABS 0.6 01/06/2021 1042   EOSABS 107 07/22/2021 0237   EOSABS 0.1 05/08/2019  1013   BASOSABS 20 07/22/2021 0237   BASOSABS 0.0 05/08/2019 1013    BMET    Component Value Date/Time   NA 140 09/04/2021 0959   K 4.1 09/04/2021 0959   CL 102 09/04/2021 0959   CO2 24 09/04/2021 0959   GLUCOSE 166 (H) 09/04/2021 0959   GLUCOSE 60 (L) 07/22/2021 0237   BUN 14 09/04/2021 0959   CREATININE 1.32 (H) 09/04/2021 0959   CREATININE 1.67 (H) 07/22/2021 0237   CALCIUM 9.0 09/04/2021 0959   GFRNONAA >60 01/06/2021 1042   GFRNONAA 52 (L) 07/15/2020 0000   GFRAA 61 07/15/2020 0000     Assessment & Plan:   Uncontrolled type 2 diabetes mellitus with hyperglycemia (HCC) - POCT glycosylated hemoglobin (Hb A1C)  -continue metformin  -diabetic diet   Follow up:  Follow up in 3 months     Fenton Foy, NP 02/11/2022

## 2022-02-11 NOTE — Patient Instructions (Signed)
1. Uncontrolled type 2 diabetes mellitus with hyperglycemia (HCC)  - POCT glycosylated hemoglobin (Hb A1C)  -continue metformin  -diabetic diet   Follow up:  Follow up in 3 months

## 2022-02-13 ENCOUNTER — Other Ambulatory Visit: Payer: Self-pay

## 2022-02-26 ENCOUNTER — Telehealth: Payer: Self-pay

## 2022-02-26 ENCOUNTER — Other Ambulatory Visit (HOSPITAL_COMMUNITY): Payer: Self-pay

## 2022-02-26 NOTE — Telephone Encounter (Signed)
Staff attempted to return patient's call. No answer. Staff will not be able to send any information to health at work as requested. Staff can provide patient with

## 2022-02-26 NOTE — Telephone Encounter (Signed)
Patient requesting we email [email or web address] proof he received his Flu shot at our office

## 2022-02-27 ENCOUNTER — Other Ambulatory Visit: Payer: Commercial Managed Care - HMO

## 2022-03-06 ENCOUNTER — Other Ambulatory Visit: Payer: Self-pay

## 2022-03-06 ENCOUNTER — Other Ambulatory Visit (HOSPITAL_COMMUNITY): Payer: Self-pay

## 2022-03-06 ENCOUNTER — Other Ambulatory Visit: Payer: Commercial Managed Care - HMO

## 2022-03-06 DIAGNOSIS — B20 Human immunodeficiency virus [HIV] disease: Secondary | ICD-10-CM

## 2022-03-10 LAB — CBC WITH DIFFERENTIAL/PLATELET
Absolute Monocytes: 554 cells/uL (ref 200–950)
Basophils Absolute: 20 cells/uL (ref 0–200)
Basophils Relative: 0.3 %
Eosinophils Absolute: 119 cells/uL (ref 15–500)
Eosinophils Relative: 1.8 %
HCT: 42.6 % (ref 38.5–50.0)
Hemoglobin: 14.7 g/dL (ref 13.2–17.1)
Lymphs Abs: 3135 cells/uL (ref 850–3900)
MCH: 30.4 pg (ref 27.0–33.0)
MCHC: 34.5 g/dL (ref 32.0–36.0)
MCV: 88.2 fL (ref 80.0–100.0)
MPV: 11.7 fL (ref 7.5–12.5)
Monocytes Relative: 8.4 %
Neutro Abs: 2772 cells/uL (ref 1500–7800)
Neutrophils Relative %: 42 %
Platelets: 194 10*3/uL (ref 140–400)
RBC: 4.83 10*6/uL (ref 4.20–5.80)
RDW: 14.4 % (ref 11.0–15.0)
Total Lymphocyte: 47.5 %
WBC: 6.6 10*3/uL (ref 3.8–10.8)

## 2022-03-10 LAB — LIPID PANEL
Cholesterol: 140 mg/dL (ref <200)
HDL: 37 mg/dL — ABNORMAL LOW (ref >=40)
LDL Cholesterol (Calc): 72 mg/dL (calc)
Non-HDL Cholesterol (Calc): 103 mg/dL (calc) (ref <130)
Total CHOL/HDL Ratio: 3.8 (calc) (ref <5.0)
Triglycerides: 212 mg/dL — ABNORMAL HIGH (ref <150)

## 2022-03-10 LAB — BASIC METABOLIC PANEL
BUN/Creatinine Ratio: 9 (calc) (ref 6–22)
BUN: 16 mg/dL (ref 7–25)
CO2: 29 mmol/L (ref 20–32)
Calcium: 8.9 mg/dL (ref 8.6–10.3)
Chloride: 106 mmol/L (ref 98–110)
Creat: 1.72 mg/dL — ABNORMAL HIGH (ref 0.70–1.30)
Glucose, Bld: 87 mg/dL (ref 65–99)
Potassium: 4.1 mmol/L (ref 3.5–5.3)
Sodium: 142 mmol/L (ref 135–146)

## 2022-03-10 LAB — T-HELPER CELLS (CD4) COUNT (NOT AT ARMC)
Absolute CD4: 684 cells/uL (ref 490–1740)
CD4 T Helper %: 20 % — ABNORMAL LOW (ref 30–61)
Total lymphocyte count: 3408 cells/uL (ref 850–3900)

## 2022-03-10 LAB — HIV-1 RNA QUANT-NO REFLEX-BLD
HIV 1 RNA Quant: 67 Copies/mL — ABNORMAL HIGH
HIV-1 RNA Quant, Log: 1.82 Log cps/mL — ABNORMAL HIGH

## 2022-03-25 ENCOUNTER — Other Ambulatory Visit: Payer: Self-pay | Admitting: Nurse Practitioner

## 2022-03-25 DIAGNOSIS — E1165 Type 2 diabetes mellitus with hyperglycemia: Secondary | ICD-10-CM

## 2022-03-25 DIAGNOSIS — I10 Essential (primary) hypertension: Secondary | ICD-10-CM

## 2022-03-25 DIAGNOSIS — E118 Type 2 diabetes mellitus with unspecified complications: Secondary | ICD-10-CM

## 2022-03-25 MED ORDER — LOSARTAN POTASSIUM-HCTZ 50-12.5 MG PO TABS: 1.0000 | ORAL_TABLET | Freq: Every day | ORAL | 3 refills | Status: DC

## 2022-03-25 MED ORDER — AMLODIPINE BESYLATE 10 MG PO TABS: ORAL_TABLET | ORAL | 11 refills | Status: DC

## 2022-03-25 MED ORDER — METFORMIN HCL 1000 MG PO TABS: 1000.0000 mg | ORAL_TABLET | Freq: Two times a day (BID) | ORAL | 3 refills | Status: DC

## 2022-03-25 MED ORDER — ATORVASTATIN CALCIUM 80 MG PO TABS: 80.0000 mg | ORAL_TABLET | Freq: Every day | ORAL | 3 refills | Status: DC

## 2022-03-29 ENCOUNTER — Other Ambulatory Visit: Payer: Self-pay | Admitting: Nurse Practitioner

## 2022-03-30 ENCOUNTER — Other Ambulatory Visit: Payer: Self-pay

## 2022-05-14 ENCOUNTER — Ambulatory Visit: Payer: Self-pay | Admitting: Nurse Practitioner

## 2022-05-19 ENCOUNTER — Other Ambulatory Visit (HOSPITAL_COMMUNITY): Payer: Self-pay

## 2022-05-20 ENCOUNTER — Telehealth: Payer: Self-pay | Admitting: Pharmacist

## 2022-05-20 ENCOUNTER — Other Ambulatory Visit (HOSPITAL_COMMUNITY): Payer: Self-pay

## 2022-05-20 DIAGNOSIS — B20 Human immunodeficiency virus [HIV] disease: Secondary | ICD-10-CM

## 2022-05-20 MED ORDER — BICTEGRAVIR-EMTRICITAB-TENOFOV 50-200-25 MG PO TABS: 1.0000 | ORAL_TABLET | Freq: Every day | ORAL | 0 refills | Status: AC

## 2022-05-20 NOTE — Telephone Encounter (Signed)
Medication Samples have been provided to the patient.  Drug name: Biktarvy        Strength: 50/200/25 mg       Qty: 7 tablets (1 bottles) LOT: CPBDCA   Exp.Date: 3/26  Dosing instructions: Take one tablet by mouth once daily  The patient has been instructed regarding the correct time, dose, and frequency of taking this medication, including desired effects and most common side effects.   Alfonse Spruce, PharmD, CPP, BCIDP, [place] Clinical Pharmacist Practitioner [facility] [place]

## 2022-05-25 ENCOUNTER — Encounter: Payer: Self-pay | Admitting: Nurse Practitioner

## 2022-05-25 ENCOUNTER — Ambulatory Visit (INDEPENDENT_AMBULATORY_CARE_PROVIDER_SITE_OTHER): Payer: Commercial Managed Care - HMO | Admitting: Nurse Practitioner

## 2022-05-25 VITALS — BP 135/89 | HR 76 | Ht 67.0 in | Wt 181.2 lb

## 2022-05-25 DIAGNOSIS — N529 Male erectile dysfunction, unspecified: Secondary | ICD-10-CM | POA: Diagnosis not present

## 2022-05-25 DIAGNOSIS — E1165 Type 2 diabetes mellitus with hyperglycemia: Secondary | ICD-10-CM

## 2022-05-25 DIAGNOSIS — R809 Proteinuria, unspecified: Secondary | ICD-10-CM | POA: Diagnosis not present

## 2022-05-25 DIAGNOSIS — Z794 Long term (current) use of insulin: Secondary | ICD-10-CM

## 2022-05-25 DIAGNOSIS — E1129 Type 2 diabetes mellitus with other diabetic kidney complication: Secondary | ICD-10-CM

## 2022-05-25 LAB — POCT GLYCOSYLATED HEMOGLOBIN (HGB A1C): Hemoglobin A1C: 7.1 % — AB (ref 4.0–5.6)

## 2022-05-25 MED ORDER — SILDENAFIL CITRATE 25 MG PO TABS: 25.0000 mg | ORAL_TABLET | Freq: Every day | ORAL | 0 refills | Status: DC | PRN

## 2022-05-25 MED ORDER — EMPAGLIFLOZIN 10 MG PO TABS: 10.0000 mg | ORAL_TABLET | Freq: Every day | ORAL | 3 refills | Status: DC

## 2022-05-25 NOTE — Progress Notes (Unsigned)
$'@Patient'r$  ID: [place], male    DOB: 1970-09-28, 52 y.o.   MRN: [id number]  Chief Complaint  Patient presents with   Follow-up    Referring provider: Fenton Foy, NP   HPI  Donald Wheeler 52 y.o. male  has a past medical history of Bell's palsy (09/07/2013), Diabetes mellitus without complication ([place]) (XX123456), HIV (human immunodeficiency virus infection) ([place]), Hypertension, and Stroke ([place]) (04/09/2017). To [facility] today for annual wellness physical.   Diabetes Mellitus: Patient presents for follow up of diabetes. Symptoms: none. Symptoms have been basically asymptomatic. Patient denies foot ulcerations, nausea, polydipsia, and polyuria.  Evaluation to date has been included: hemoglobin A1C.  Home sugars: patient does not check sugars. Treatment to date: no recent interventions. Endorses monitoring meals at home and walks intermittently throughout the day. just on metformin - wanted to stop insulin. Started on jardiance but has not been taking due to cost.   Hypertension: Patient here for follow-up of elevated blood pressure. He is exercising and is adherent to low salt diet.  Is not checking B/P at home. Cardiac symptoms none. Patient denies chest pain, claudication, and irregular heart beat.  Cardiovascular risk factors: diabetes mellitus, dyslipidemia, hypertension, and male gender. Use of agents associated with hypertension: none. 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 Denies any fever. Denies any fatigue, chest pain, shortness of breath, HA or dizziness. Denies any blurred vision, numbness or tingling.    No Known Allergies  Immunization History  Administered Date(s) Administered   Hepatitis A 02/22/2012   Hepatitis A, Adult 02/20/2013   Hepatitis B 02/22/2012, 03/17/2012   Influenza Split 03/17/2012   Influenza Whole 03/17/2019   Influenza,inj,Quad PF,6+ Mos 02/20/2013, 01/03/2014, 11/07/2018,  01/26/2022   Meningococcal Mcv4o 05/11/2016   PFIZER(Purple Top)SARS-COV-2 Vaccination 05/29/2019, 06/20/2019, 01/05/2020   PNEUMOCOCCAL CONJUGATE-20 07/22/2021   Pneumococcal Polysaccharide-23 03/17/2012   Td 02/22/2012   Tdap 02/22/2012    Past Medical History:  Diagnosis Date   Bell's palsy 09/07/2013   Diabetes mellitus without complication ([place]) [id number]   NEW ONSET    HIV (human immunodeficiency virus infection) ([place])    dx'd 2000s   Hypertension    Stroke ([place]) 04/09/2017   vs TIA w/acute onset of right-sided weakness as well as trouble speaking/notes 04/09/2017    Tobacco History: Social History   Tobacco Use  Smoking Status Some Days   Packs/day: 0.20   Years: 15.00   Total pack years: 3.00   Types: Cigars, Cigarettes   Last attempt to quit: 12/12/2017   Years since quitting: 4.4  Smokeless Tobacco Never  Tobacco Comments   black and milds daily   Ready to quit: Not Answered Counseling given: Not Answered Tobacco comments: black and milds daily   Outpatient Encounter Medications as of 05/25/2022  Medication Sig   amLODipine (NORVASC) 10 MG tablet TAKE 1 TABLET(10 MG) BY MOUTH DAILY   ASPIRIN LOW DOSE 81 MG tablet TAKE 1 TABLET(81 MG) BY MOUTH DAILY   atorvastatin (LIPITOR) 80 MG tablet Take 1 tablet (80 mg total) by mouth daily. TAKE 1 TABLET BY MOUTH DAILY   losartan-hydrochlorothiazide (HYZAAR) 50-12.5 MG tablet Take 1 tablet by mouth daily.   metFORMIN (GLUCOPHAGE) 1000 MG tablet Take 1 tablet (1,000 mg total) by mouth 2 (two) times daily with a meal.   sildenafil (VIAGRA) 25 MG tablet Take 1 tablet (25 mg total) by mouth daily as needed for erectile dysfunction.   bictegravir-emtricitabine-tenofovir AF (  BIKTARVY) 50-200-25 MG TABS tablet Take 1 tablet by mouth daily for 7 days. (Patient not taking: Reported on 05/25/2022)   BIKTARVY 50-200-25 MG TABS tablet TAKE 1 TABLET BY MOUTH DAILY (Patient not taking: Reported on 05/25/2022)   blood glucose meter kit and  supplies KIT Dispense based on patient and insurance preference. Use up to four times daily as directed. (Patient not taking: Reported on 05/25/2022)   blood glucose meter kit and supplies Dispense based on patient and insurance preference. Use up to four times daily as directed. (FOR ICD-10 E10.9, E11.9). (Patient not taking: Reported on 05/25/2022)   empagliflozin (JARDIANCE) 10 MG TABS tablet Take 1 tablet (10 mg total) by mouth daily before breakfast.   [DISCONTINUED] empagliflozin (JARDIANCE) 10 MG TABS tablet Take 1 tablet (10 mg total) by mouth daily before breakfast. (Patient not taking: Reported on 12/19/2021)   No facility-administered encounter medications on file as of 05/25/2022.     Review of Systems  Review of Systems  Constitutional: Negative.   HENT: Negative.    Cardiovascular: Negative.   Gastrointestinal: Negative.   Allergic/Immunologic: Negative.   Neurological: Negative.   Psychiatric/Behavioral: Negative.         Physical Exam  BP 135/89   Pulse 76   Ht '5\' 7"'$  (1.702 m)   Wt 181 lb 3.2 oz (82.2 kg)   SpO2 100%   BMI 28.38 kg/m   Wt Readings from Last 5 Encounters:  05/25/22 181 lb 3.2 oz (82.2 kg)  02/11/22 194 lb (88 kg)  01/26/22 191 lb (86.6 kg)  11/07/21 190 lb (86.2 kg)  09/04/21 189 lb 3.2 oz (85.8 kg)     Physical Exam Vitals and nursing note reviewed.  Constitutional:      General: He is not in acute distress.    Appearance: He is well-developed.  Cardiovascular:     Rate and Rhythm: Normal rate and regular rhythm.  Pulmonary:     Effort: Pulmonary effort is normal.     Breath sounds: Normal breath sounds.  Skin:    General: Skin is warm and dry.  Neurological:     Mental Status: He is alert and oriented to person, place, and time.  Psychiatric:        Mood and Affect: Mood normal.        Behavior: Behavior normal.        Assessment & Plan:   Controlled type 2 diabetes mellitus with microalbuminuria, with long-term current  use of insulin (HCC) - POCT glycosylated hemoglobin (Hb A1C) - Ambulatory referral to Podiatry - Ambulatory referral to Ophthalmology - Microalbumin/Creatinine Ratio, Urine   2. Uncontrolled type 2 diabetes mellitus with hyperglycemia (HCC)  - empagliflozin (JARDIANCE) 10 MG TABS tablet; Take 1 tablet (10 mg total) by mouth daily before breakfast.  Dispense: 90 tablet; Refill: 3    Follow up:  Follow up in 3 months     Fenton Foy, NP 05/25/2022

## 2022-05-25 NOTE — Patient Instructions (Addendum)
1. Controlled type 2 diabetes mellitus with microalbuminuria, with long-term current use of insulin (HCC) - POCT glycosylated hemoglobin (Hb A1C) - Ambulatory referral to Podiatry - Ambulatory referral to Ophthalmology - Microalbumin/Creatinine Ratio, Urine   2. Uncontrolled type 2 diabetes mellitus with hyperglycemia (HCC)  - empagliflozin (JARDIANCE) 10 MG TABS tablet; Take 1 tablet (10 mg total) by mouth daily before breakfast.  Dispense: 90 tablet; Refill: 3    Follow up:  Follow up in 3 months

## 2022-05-25 NOTE — Assessment & Plan Note (Signed)
-   POCT glycosylated hemoglobin (Hb A1C) - Ambulatory referral to Podiatry - Ambulatory referral to Ophthalmology - Microalbumin/Creatinine Ratio, Urine   2. Uncontrolled type 2 diabetes mellitus with hyperglycemia (HCC)  - empagliflozin (JARDIANCE) 10 MG TABS tablet; Take 1 tablet (10 mg total) by mouth daily before breakfast.  Dispense: 90 tablet; Refill: 3    Follow up:  Follow up in 3 months

## 2022-05-26 LAB — MICROALBUMIN / CREATININE URINE RATIO
Creatinine, Urine: 164.3 mg/dL
Microalb/Creat Ratio: 55 mg/g creat — ABNORMAL HIGH (ref 0–29)
Microalbumin, Urine: 90.4 ug/mL

## 2022-06-18 ENCOUNTER — Ambulatory Visit (INDEPENDENT_AMBULATORY_CARE_PROVIDER_SITE_OTHER): Payer: Commercial Managed Care - HMO | Admitting: Podiatry

## 2022-06-18 DIAGNOSIS — Z91199 Patient's noncompliance with other medical treatment and regimen due to unspecified reason: Secondary | ICD-10-CM

## 2022-06-18 NOTE — Progress Notes (Signed)
Pt was a no show for apt, charge generated 

## 2022-06-24 ENCOUNTER — Other Ambulatory Visit (HOSPITAL_COMMUNITY): Payer: Self-pay

## 2022-07-01 ENCOUNTER — Other Ambulatory Visit: Payer: Self-pay

## 2022-07-01 ENCOUNTER — Other Ambulatory Visit (HOSPITAL_COMMUNITY): Payer: Self-pay

## 2022-07-01 DIAGNOSIS — B20 Human immunodeficiency virus [HIV] disease: Secondary | ICD-10-CM

## 2022-07-01 MED ORDER — BIKTARVY 50-200-25 MG PO TABS: 1.0000 | ORAL_TABLET | Freq: Every day | ORAL | 0 refills | Status: DC

## 2022-07-16 ENCOUNTER — Ambulatory Visit: Payer: Self-pay | Admitting: Internal Medicine

## 2022-07-30 ENCOUNTER — Other Ambulatory Visit: Payer: Self-pay

## 2022-07-30 ENCOUNTER — Encounter: Payer: Self-pay | Admitting: Internal Medicine

## 2022-07-30 ENCOUNTER — Ambulatory Visit (INDEPENDENT_AMBULATORY_CARE_PROVIDER_SITE_OTHER): Payer: 59 | Admitting: Internal Medicine

## 2022-07-30 ENCOUNTER — Other Ambulatory Visit (HOSPITAL_COMMUNITY): Payer: Self-pay

## 2022-07-30 VITALS — BP 139/88 | HR 64 | Temp 97.6°F | Ht 67.0 in | Wt 176.0 lb

## 2022-07-30 DIAGNOSIS — I129 Hypertensive chronic kidney disease with stage 1 through stage 4 chronic kidney disease, or unspecified chronic kidney disease: Secondary | ICD-10-CM | POA: Diagnosis not present

## 2022-07-30 DIAGNOSIS — Z7984 Long term (current) use of oral hypoglycemic drugs: Secondary | ICD-10-CM

## 2022-07-30 DIAGNOSIS — N1832 Chronic kidney disease, stage 3b: Secondary | ICD-10-CM | POA: Diagnosis not present

## 2022-07-30 DIAGNOSIS — I10 Essential (primary) hypertension: Secondary | ICD-10-CM

## 2022-07-30 DIAGNOSIS — B20 Human immunodeficiency virus [HIV] disease: Secondary | ICD-10-CM | POA: Diagnosis not present

## 2022-07-30 DIAGNOSIS — E118 Type 2 diabetes mellitus with unspecified complications: Secondary | ICD-10-CM

## 2022-07-30 DIAGNOSIS — E1122 Type 2 diabetes mellitus with diabetic chronic kidney disease: Secondary | ICD-10-CM

## 2022-07-30 MED ORDER — HYDROCHLOROTHIAZIDE 25 MG PO TABS
12.5000 mg | ORAL_TABLET | Freq: Every day | ORAL | 6 refills | Status: DC
Start: 2022-07-30 — End: 2022-08-26

## 2022-07-30 MED ORDER — MIRTAZAPINE 15 MG PO TABS: 15.0000 mg | ORAL_TABLET | Freq: Every day | ORAL | 11 refills | Status: DC

## 2022-07-30 MED ORDER — ROSUVASTATIN CALCIUM 20 MG PO TABS
20.0000 mg | ORAL_TABLET | Freq: Every day | ORAL | 11 refills | Status: DC
Start: 2022-07-30 — End: 2022-08-26

## 2022-07-30 MED ORDER — AMLODIPINE BESYLATE 10 MG PO TABS
ORAL_TABLET | ORAL | 11 refills | Status: DC
Start: 2022-07-30 — End: 2022-12-02

## 2022-07-30 MED ORDER — BIKTARVY 50-200-25 MG PO TABS: 1.0000 | ORAL_TABLET | Freq: Every day | ORAL | 11 refills | Status: DC

## 2022-07-30 MED ORDER — LOSARTAN POTASSIUM 50 MG PO TABS: 50.0000 mg | ORAL_TABLET | Freq: Every day | ORAL | 11 refills | Status: DC

## 2022-07-30 NOTE — Progress Notes (Signed)
Patient ID: Donald Wheeler, male   DOB: Feb 05, 1971, 52 y.o.   MRN: [id number]  HPI Donald Wheeler is a 52yo M with well controlled hiv disease. He is on biktarvy. He mentioned that he is worried about weight loss now at 176 at this visit. BS up and down. Does not have glucometer. Lack of insurance presently despite working full time until open enrollment window opens up until October). He has only a few days left of biktarvy.  No other health concerns  Outpatient Encounter Medications as of 07/30/2022  Medication Sig   amLODipine (NORVASC) 10 MG tablet TAKE 1 TABLET(10 MG) BY MOUTH DAILY   ASPIRIN LOW DOSE 81 MG tablet TAKE 1 TABLET(81 MG) BY MOUTH DAILY   atorvastatin (LIPITOR) 80 MG tablet Take 1 tablet (80 mg total) by mouth daily. TAKE 1 TABLET BY MOUTH DAILY   bictegravir-emtricitabine-tenofovir AF (BIKTARVY) 50-200-25 MG TABS tablet Take 1 tablet by mouth daily.   metFORMIN (GLUCOPHAGE) 1000 MG tablet Take 1 tablet (1,000 mg total) by mouth 2 (two) times daily with a meal.   blood glucose meter kit and supplies KIT Dispense based on patient and insurance preference. Use up to four times daily as directed. (Patient not taking: Reported on 05/25/2022)   blood glucose meter kit and supplies Dispense based on patient and insurance preference. Use up to four times daily as directed. (FOR ICD-10 E10.9, E11.9). (Patient not taking: Reported on 05/25/2022)   empagliflozin (JARDIANCE) 10 MG TABS tablet Take 1 tablet (10 mg total) by mouth daily before breakfast.   losartan-hydrochlorothiazide (HYZAAR) 50-12.5 MG tablet Take 1 tablet by mouth daily. (Patient not taking: Reported on 07/30/2022)   sildenafil (VIAGRA) 25 MG tablet Take 1 tablet (25 mg total) by mouth daily as needed for erectile dysfunction. (Patient not taking: Reported on 07/30/2022)   No facility-administered encounter medications on file as of 07/30/2022.     Patient Active Problem List   Diagnosis Date Noted   Uncontrolled type 2  diabetes mellitus with hyperglycemia (HCC) 08/04/2021   Healthcare maintenance 02/19/2021   PSA elevation 02/05/2021   Persistent microscopic hematuria 02/05/2021   Controlled type 2 diabetes mellitus with microalbuminuria, with long-term current use of insulin (HCC) 08/09/2019   Vitamin D deficiency 05/10/2019   Shingles 07/2018   Hyperosmolar non-ketotic state in patient with type 2 diabetes mellitus (HCC) 12/23/2017   Diabetes mellitus, new onset (HCC) 12/23/2017   History of hematuria 08/16/2017   Mixed hyperlipidemia 06/16/2017   Cerebral thrombosis with cerebral infarction 04/10/2017   Solitary pulmonary nodule 04/10/2017   Focal neurological deficit 04/09/2017   CKD (chronic kidney disease) stage 3, GFR 30-59 ml/min 04/09/2017   Smoker 04/09/2017   HTN (hypertension), benign 09/07/2013   HIV (human immunodeficiency virus infection) (HCC) 03/17/2012     Health Maintenance Due  Topic Date Due   Zoster Vaccines- Shingrix (1 of 2) Never done   Fecal DNA (Cologuard)  Never done   OPHTHALMOLOGY EXAM  08/27/2021   COVID-19 Vaccine (4 - 2023-24 season) 11/14/2021   FOOT EXAM  02/03/2022   DTaP/Tdap/Td (3 - Td or Tdap) 02/21/2022     Review of Systems Review of Systems +weight loss. Constitutional: Negative for fever, chills, diaphoresis, activity change, appetite change, fatigue and unexpected weight change.  HENT: Negative for congestion, sore throat, rhinorrhea, sneezing, trouble swallowing and sinus pressure.  Eyes: Negative for photophobia and visual disturbance.  Respiratory: Negative for cough, chest tightness, shortness of breath, wheezing and stridor.  Cardiovascular: Negative for  chest pain, palpitations and leg swelling.  Gastrointestinal: Negative for nausea, vomiting, abdominal pain, diarrhea, constipation, blood in stool, abdominal distention and anal bleeding.  Genitourinary: Negative for dysuria, hematuria, flank pain and difficulty urinating.  Musculoskeletal:  Negative for myalgias, back pain, joint swelling, arthralgias and gait problem.  Skin: Negative for color change, pallor, rash and wound.  Neurological: Negative for dizziness, tremors, weakness and light-headedness.  Hematological: Negative for adenopathy. Does not bruise/bleed easily.  Psychiatric/Behavioral: Negative for behavioral problems, confusion, sleep disturbance, dysphoric mood, decreased concentration and agitation.   Physical Exam   BP (!) 158/93   Pulse 64   Temp 97.6 F (36.4 C) (Temporal)   Ht 5\' 7"  (1.702 m)   Wt 176 lb (79.8 kg)   SpO2 98%   BMI 27.57 kg/m   Physical Exam  Constitutional: He is oriented to person, place, and time. He appears well-developed and well-nourished. No distress.  HENT:  Mouth/Throat: Oropharynx is clear and moist. No oropharyngeal exudate.  Cardiovascular: Normal rate, regular rhythm and normal heart sounds. Exam reveals no gallop and no friction rub.  No murmur heard.  Pulmonary/Chest: Effort normal and breath sounds normal. No respiratory distress. He has no wheezes.  Abdominal: Soft. Bowel sounds are normal. He exhibits no distension. There is no tenderness.  Lymphadenopathy:  He has no cervical adenopathy.  Neurological: He is alert and oriented to person, place, and time.  Skin: Skin is warm and dry. No rash noted. No erythema.  Psychiatric: He has a normal mood and affect. His behavior is normal.    Lab Results  Component Value Date   CD4TCELL 20 (L) 03/06/2022   Lab Results  Component Value Date   CD4TABS 488 07/22/2021   CD4TABS 554 02/19/2021   CD4TABS 454 07/15/2020   Lab Results  Component Value Date   HIV1RNAQUANT 67 (H) 03/06/2022   Lab Results  Component Value Date   HEPBSAB NONREACTIVE 03/03/2012   Lab Results  Component Value Date   LABRPR NON-REACTIVE 01/20/2022    CBC Lab Results  Component Value Date   WBC 6.6 03/06/2022   RBC 4.83 03/06/2022   HGB 14.7 03/06/2022   HCT 42.6 03/06/2022   PLT  194 03/06/2022   MCV 88.2 03/06/2022   MCH 30.4 03/06/2022   MCHC 34.5 03/06/2022   RDW 14.4 03/06/2022   LYMPHSABS 3,135 03/06/2022   MONOABS 0.6 01/06/2021   EOSABS 119 03/06/2022    BMET Lab Results  Component Value Date   NA 142 03/06/2022   K 4.1 03/06/2022   CL 106 03/06/2022   CO2 29 03/06/2022   GLUCOSE 87 03/06/2022   BUN 16 03/06/2022   CREATININE 1.72 (H) 03/06/2022   CALCIUM 8.9 03/06/2022   GFRNONAA >60 01/06/2021   GFRAA 61 07/15/2020      Assessment and Plan HIV disease = running out of medication. Will check labs and refill biktarvy  Long term medication management/ ckd 3 = will check bmp, ua and urine microscopy  HTN = out of BP meds. Has only taken amlodipine. Will refill his medications  Hypercholesteremia =  Reorder lipids and crestor was not taking lipid control agent

## 2022-07-31 LAB — T-HELPER CELL (CD4) - (RCID CLINIC ONLY)
CD4 % Helper T Cell: 24 % — ABNORMAL LOW (ref 33–65)
CD4 T Cell Abs: 456 /uL (ref 400–1790)

## 2022-07-31 LAB — URINE CYTOLOGY ANCILLARY ONLY
Chlamydia: NEGATIVE
Comment: NEGATIVE
Comment: NORMAL
Neisseria Gonorrhea: NEGATIVE

## 2022-08-01 LAB — MICROALBUMIN / CREATININE URINE RATIO
Creatinine, Urine: 151 mg/dL (ref 20–320)
Microalb Creat Ratio: 689 mg/g creat — ABNORMAL HIGH (ref <30)
Microalb, Ur: 104 mg/dL

## 2022-08-03 LAB — COMPLETE METABOLIC PANEL WITH GFR
AG Ratio: 1.4 (calc) (ref 1.0–2.5)
ALT: 16 U/L (ref 9–46)
AST: 17 U/L (ref 10–35)
Albumin: 4.1 g/dL (ref 3.6–5.1)
Alkaline phosphatase (APISO): 69 U/L (ref 35–144)
BUN/Creatinine Ratio: 11 (calc) (ref 6–22)
BUN: 19 mg/dL (ref 7–25)
CO2: 30 mmol/L (ref 20–32)
Calcium: 9.3 mg/dL (ref 8.6–10.3)
Chloride: 106 mmol/L (ref 98–110)
Creat: 1.73 mg/dL — ABNORMAL HIGH (ref 0.70–1.30)
Globulin: 2.9 g/dL (calc) (ref 1.9–3.7)
Glucose, Bld: 99 mg/dL (ref 65–99)
Potassium: 4.5 mmol/L (ref 3.5–5.3)
Sodium: 143 mmol/L (ref 135–146)
Total Bilirubin: 0.6 mg/dL (ref 0.2–1.2)
Total Protein: 7 g/dL (ref 6.1–8.1)
eGFR: 46 mL/min/{1.73_m2} — ABNORMAL LOW (ref >=60)

## 2022-08-03 LAB — CBC WITH DIFFERENTIAL/PLATELET
Absolute Monocytes: 580 cells/uL (ref 200–950)
Basophils Absolute: 18 cells/uL (ref 0–200)
Basophils Relative: 0.3 %
Eosinophils Absolute: 128 cells/uL (ref 15–500)
Eosinophils Relative: 2.1 %
HCT: 46.7 % (ref 38.5–50.0)
Hemoglobin: 15.9 g/dL (ref 13.2–17.1)
Lymphs Abs: 2147 cells/uL (ref 850–3900)
MCH: 30.1 pg (ref 27.0–33.0)
MCHC: 34 g/dL (ref 32.0–36.0)
MCV: 88.3 fL (ref 80.0–100.0)
MPV: 11.3 fL (ref 7.5–12.5)
Monocytes Relative: 9.5 %
Neutro Abs: 3227 cells/uL (ref 1500–7800)
Neutrophils Relative %: 52.9 %
Platelets: 213 10*3/uL (ref 140–400)
RBC: 5.29 10*6/uL (ref 4.20–5.80)
RDW: 14.2 % (ref 11.0–15.0)
Total Lymphocyte: 35.2 %
WBC: 6.1 10*3/uL (ref 3.8–10.8)

## 2022-08-03 LAB — LIPID PANEL
Cholesterol: 141 mg/dL (ref <200)
HDL: 41 mg/dL (ref >=40)
LDL Cholesterol (Calc): 76 mg/dL (calc)
Non-HDL Cholesterol (Calc): 100 mg/dL (calc) (ref <130)
Total CHOL/HDL Ratio: 3.4 (calc) (ref <5.0)
Triglycerides: 142 mg/dL (ref <150)

## 2022-08-03 LAB — HIV-1 RNA QUANT-NO REFLEX-BLD
HIV 1 RNA Quant: <20 Copies/mL — ABNORMAL HIGH
HIV-1 RNA Quant, Log: <1.3 Log cps/mL — ABNORMAL HIGH

## 2022-08-03 LAB — HEMOGLOBIN A1C
Hgb A1c MFr Bld: 6.4 % of total Hgb — ABNORMAL HIGH (ref <5.7)
Mean Plasma Glucose: 137 mg/dL
eAG (mmol/L): 7.6 mmol/L

## 2022-08-03 LAB — RPR: RPR Ser Ql: NONREACTIVE

## 2022-08-26 ENCOUNTER — Encounter: Payer: Self-pay | Admitting: Nurse Practitioner

## 2022-08-26 ENCOUNTER — Ambulatory Visit (INDEPENDENT_AMBULATORY_CARE_PROVIDER_SITE_OTHER): Payer: 59 | Admitting: Nurse Practitioner

## 2022-08-26 VITALS — BP 131/79 | HR 67 | Temp 97.3°F | Wt 182.8 lb

## 2022-08-26 DIAGNOSIS — E1165 Type 2 diabetes mellitus with hyperglycemia: Secondary | ICD-10-CM | POA: Diagnosis not present

## 2022-08-26 DIAGNOSIS — N529 Male erectile dysfunction, unspecified: Secondary | ICD-10-CM | POA: Diagnosis not present

## 2022-08-26 DIAGNOSIS — I10 Essential (primary) hypertension: Secondary | ICD-10-CM | POA: Diagnosis not present

## 2022-08-26 MED ORDER — EMPAGLIFLOZIN 10 MG PO TABS: 10.0000 mg | ORAL_TABLET | Freq: Every day | ORAL | 3 refills | Status: DC

## 2022-08-26 MED ORDER — SILDENAFIL CITRATE 25 MG PO TABS
25.0000 mg | ORAL_TABLET | Freq: Every day | ORAL | 0 refills | Status: DC | PRN
Start: 2022-08-26 — End: 2022-12-24

## 2022-08-26 MED ORDER — HYDROCHLOROTHIAZIDE 25 MG PO TABS: 12.5000 mg | ORAL_TABLET | Freq: Every day | ORAL | 6 refills | Status: DC

## 2022-08-26 MED ORDER — BLOOD GLUCOSE METER KIT: PACK | 0 refills | Status: DC

## 2022-08-26 MED ORDER — BLOOD GLUCOSE MONITOR KIT: PACK | 0 refills | Status: DC

## 2022-08-26 MED ORDER — LOSARTAN POTASSIUM 50 MG PO TABS: 50.0000 mg | ORAL_TABLET | Freq: Every day | ORAL | 11 refills | Status: DC

## 2022-08-26 MED ORDER — ROSUVASTATIN CALCIUM 20 MG PO TABS: 20.0000 mg | ORAL_TABLET | Freq: Every day | ORAL | 11 refills | Status: DC

## 2022-08-26 NOTE — Progress Notes (Signed)
@Patient  ID: Donald Wheeler, male    DOB: [birth year], 52 y.o.   MRN: [id number]  Chief Complaint  Patient presents with   Diabetes    Follow up A1c done three weeks ago with ID    Referring provider: Ivonne Andrew, NP   HPI  Donald Wheeler 52 y.o. male  has a past medical history of Bell's palsy (09/07/2013), Diabetes mellitus without complication (HCC) (12/2017), HIV (human immunodeficiency virus infection) (HCC), Hypertension, and Stroke (HCC) (04/09/2017). To [facility] today for annual wellness physical.   Diabetes Mellitus: Patient presents for follow up of diabetes. Symptoms: none. Symptoms have been basically asymptomatic. Patient denies foot ulcerations, nausea, polydipsia, and polyuria.  Evaluation to date has been included: hemoglobin A1C.  Home sugars: patient does not check sugars. Treatment to date: no recent interventions. Endorses monitoring meals at home and walks intermittently throughout the day. just on metformin - wanted to stop insulin. Started on jardiance but has not been taking due to cost.  Patient had blood work completed on 07/30/2022 through infectious disease.  His A1c was 6.4 at that time.  That is improved since last check here on 05/25/2022 when A1c was 7.1.   Hypertension: Patient here for follow-up of elevated blood pressure. He is exercising and is adherent to low salt diet.  Is not checking B/P at home. Cardiac symptoms none. Patient denies chest pain, claudication, and irregular heart beat.  Cardiovascular risk factors: diabetes mellitus, dyslipidemia, hypertension, and male gender. Use of agents associated with hypertension: none. History of target organ damage: none     No Known Allergies  Immunization History  Administered Date(s) Administered   Hepatitis A 02/22/2012   Hepatitis A, Adult 02/20/2013   Hepatitis B 02/22/2012, 03/17/2012   Influenza Split 03/17/2012   Influenza Whole 03/17/2019   Influenza,inj,Quad PF,6+ Mos 02/20/2013, 01/03/2014,  11/07/2018, 01/26/2022   Meningococcal Mcv4o 05/11/2016   PFIZER(Purple Top)SARS-COV-2 Vaccination 05/29/2019, 06/20/2019, 01/05/2020   PNEUMOCOCCAL CONJUGATE-20 07/22/2021   Pneumococcal Polysaccharide-23 03/17/2012   Td 02/22/2012   Tdap 02/22/2012    Past Medical History:  Diagnosis Date   Bell's palsy 09/07/2013   Diabetes mellitus without complication (HCC) 12/2017   NEW ONSET    HIV (human immunodeficiency virus infection) (HCC)    dx'd 2000s   Hypertension    Stroke (HCC) 04/09/2017   vs TIA w/acute onset of right-sided weakness as well as trouble speaking/notes 04/09/2017    Tobacco History: Social History   Tobacco Use  Smoking Status Some Days   Packs/day: 0.20   Years: 15.00   Additional pack years: 0.00   Total pack years: 3.00   Types: Cigars, Cigarettes   Last attempt to quit: 12/12/2017   Years since quitting: 4.7  Smokeless Tobacco Never  Tobacco Comments   black and milds daily   Ready to quit: Not Answered Counseling given: Not Answered Tobacco comments: black and milds daily   Outpatient Encounter Medications as of 08/26/2022  Medication Sig   amLODipine (NORVASC) 10 MG tablet TAKE 1 TABLET(10 MG) BY MOUTH DAILY   ASPIRIN LOW DOSE 81 MG tablet TAKE 1 TABLET(81 MG) BY MOUTH DAILY   bictegravir-emtricitabine-tenofovir AF (BIKTARVY) 50-200-25 MG TABS tablet Take 1 tablet by mouth daily.   metFORMIN (GLUCOPHAGE) 1000 MG tablet Take 1 tablet (1,000 mg total) by mouth 2 (two) times daily with a meal.   [DISCONTINUED] rosuvastatin (CRESTOR) 20 MG tablet Take 1 tablet (20 mg total) by mouth daily.   blood glucose meter kit and supplies KIT  Dispense based on patient and insurance preference. Use up to four times daily as directed.   blood glucose meter kit and supplies Dispense based on patient and insurance preference. Use up to four times daily as directed. (FOR ICD-10 E10.9, E11.9).   empagliflozin (JARDIANCE) 10 MG TABS tablet Take 1 tablet (10 mg total)  by mouth daily before breakfast.   hydrochlorothiazide (HYDRODIURIL) 25 MG tablet Take 0.5 tablets (12.5 mg total) by mouth daily.   losartan (COZAAR) 50 MG tablet Take 1 tablet (50 mg total) by mouth daily.   mirtazapine (REMERON) 15 MG tablet Take 1 tablet (15 mg total) by mouth at bedtime. (Patient not taking: Reported on 08/26/2022)   rosuvastatin (CRESTOR) 20 MG tablet Take 1 tablet (20 mg total) by mouth daily.   sildenafil (VIAGRA) 25 MG tablet Take 1 tablet (25 mg total) by mouth daily as needed for erectile dysfunction.   [DISCONTINUED] blood glucose meter kit and supplies KIT Dispense based on patient and insurance preference. Use up to four times daily as directed. (Patient not taking: Reported on 05/25/2022)   [DISCONTINUED] blood glucose meter kit and supplies Dispense based on patient and insurance preference. Use up to four times daily as directed. (FOR ICD-10 E10.9, E11.9). (Patient not taking: Reported on 05/25/2022)   [DISCONTINUED] empagliflozin (JARDIANCE) 10 MG TABS tablet Take 1 tablet (10 mg total) by mouth daily before breakfast.   [DISCONTINUED] hydrochlorothiazide (HYDRODIURIL) 25 MG tablet Take 0.5 tablets (12.5 mg total) by mouth daily. (Patient not taking: Reported on 08/26/2022)   [DISCONTINUED] losartan (COZAAR) 50 MG tablet Take 1 tablet (50 mg total) by mouth daily. (Patient not taking: Reported on 08/26/2022)   [DISCONTINUED] sildenafil (VIAGRA) 25 MG tablet Take 1 tablet (25 mg total) by mouth daily as needed for erectile dysfunction. (Patient not taking: Reported on 07/30/2022)   No facility-administered encounter medications on file as of 08/26/2022.     Review of Systems  Review of Systems  Constitutional: Negative.   HENT: Negative.    Cardiovascular: Negative.   Gastrointestinal: Negative.   Allergic/Immunologic: Negative.   Neurological: Negative.   Psychiatric/Behavioral: Negative.         Physical Exam  BP 131/79   Pulse 67   Temp (!) 97.3 F  (36.3 C)   Wt 182 lb 12.8 oz (82.9 kg)   SpO2 99%   BMI 28.63 kg/m   Wt Readings from Last 5 Encounters:  08/26/22 182 lb 12.8 oz (82.9 kg)  07/30/22 176 lb (79.8 kg)  05/25/22 181 lb 3.2 oz (82.2 kg)  02/11/22 194 lb (88 kg)  01/26/22 191 lb (86.6 kg)     Physical Exam Vitals and nursing note reviewed.  Constitutional:      General: He is not in acute distress.    Appearance: He is well-developed.  Cardiovascular:     Rate and Rhythm: Normal rate and regular rhythm.  Pulmonary:     Effort: Pulmonary effort is normal.     Breath sounds: Normal breath sounds.  Skin:    General: Skin is warm and dry.  Neurological:     Mental Status: He is alert and oriented to person, place, and time.      Lab Results:  CBC    Component Value Date/Time   WBC 6.1 07/30/2022 0232   RBC 5.29 07/30/2022 0232   HGB 15.9 07/30/2022 0232   HGB 14.8 09/04/2021 0959   HCT 46.7 07/30/2022 0232   HCT 43.1 09/04/2021 0959   PLT 213 07/30/2022  0232   PLT 208 09/04/2021 0959   MCV 88.3 07/30/2022 0232   MCV 87 09/04/2021 0959   MCH 30.1 07/30/2022 0232   MCHC 34.0 07/30/2022 0232   RDW 14.2 07/30/2022 0232   RDW 14.3 09/04/2021 0959   LYMPHSABS 2,147 07/30/2022 0232   LYMPHSABS 2.3 05/08/2019 1013   MONOABS 0.6 01/06/2021 1042   EOSABS 128 07/30/2022 0232   EOSABS 0.1 05/08/2019 1013   BASOSABS 18 07/30/2022 0232   BASOSABS 0.0 05/08/2019 1013    BMET    Component Value Date/Time   NA 143 07/30/2022 0232   NA 140 09/04/2021 0959   K 4.5 07/30/2022 0232   CL 106 07/30/2022 0232   CO2 30 07/30/2022 0232   GLUCOSE 99 07/30/2022 0232   BUN 19 07/30/2022 0232   BUN 14 09/04/2021 0959   CREATININE 1.73 (H) 07/30/2022 0232   CALCIUM 9.3 07/30/2022 0232   GFRNONAA >60 01/06/2021 1042   GFRNONAA 52 (L) 07/15/2020 0000   GFRAA 61 07/15/2020 0000      Assessment & Plan:   Uncontrolled type 2 diabetes mellitus with hyperglycemia (HCC) - blood glucose meter kit and supplies;  Dispense based on patient and insurance preference. Use up to four times daily as directed. (FOR ICD-10 E10.9, E11.9).  Dispense: 1 each; Refill: 0 - blood glucose meter kit and supplies KIT; Dispense based on patient and insurance preference. Use up to four times daily as directed.  Dispense: 1 each; Refill: 0 - empagliflozin (JARDIANCE) 10 MG TABS tablet; Take 1 tablet (10 mg total) by mouth daily before breakfast.  Dispense: 90 tablet; Refill: 3 - AMB Referral to Pharmacy Medication Management   2. Erectile dysfunction, unspecified erectile dysfunction type  - sildenafil (VIAGRA) 25 MG tablet; Take 1 tablet (25 mg total) by mouth daily as needed for erectile dysfunction.  Dispense: 10 tablet; Refill: 0   3. HTN (hypertension), benign  - AMB Referral to Pharmacy Medication Management   Follow up:  Follow up in 3 months     Ivonne Andrew, NP 08/26/2022

## 2022-08-26 NOTE — Patient Instructions (Addendum)
1. Uncontrolled type 2 diabetes mellitus with hyperglycemia (HCC)  - blood glucose meter kit and supplies; Dispense based on patient and insurance preference. Use up to four times daily as directed. (FOR ICD-10 E10.9, E11.9).  Dispense: 1 each; Refill: 0 - blood glucose meter kit and supplies KIT; Dispense based on patient and insurance preference. Use up to four times daily as directed.  Dispense: 1 each; Refill: 0 - empagliflozin (JARDIANCE) 10 MG TABS tablet; Take 1 tablet (10 mg total) by mouth daily before breakfast.  Dispense: 90 tablet; Refill: 3 - AMB Referral to Pharmacy Medication Management   2. Erectile dysfunction, unspecified erectile dysfunction type  - sildenafil (VIAGRA) 25 MG tablet; Take 1 tablet (25 mg total) by mouth daily as needed for erectile dysfunction.  Dispense: 10 tablet; Refill: 0   3. HTN (hypertension), benign  - AMB Referral to Pharmacy Medication Management   Follow up:  Follow up in 3 months

## 2022-08-26 NOTE — Assessment & Plan Note (Signed)
-   blood glucose meter kit and supplies; Dispense based on patient and insurance preference. Use up to four times daily as directed. (FOR ICD-10 E10.9, E11.9).  Dispense: 1 each; Refill: 0 - blood glucose meter kit and supplies KIT; Dispense based on patient and insurance preference. Use up to four times daily as directed.  Dispense: 1 each; Refill: 0 - empagliflozin (JARDIANCE) 10 MG TABS tablet; Take 1 tablet (10 mg total) by mouth daily before breakfast.  Dispense: 90 tablet; Refill: 3 - AMB Referral to Pharmacy Medication Management   2. Erectile dysfunction, unspecified erectile dysfunction type  - sildenafil (VIAGRA) 25 MG tablet; Take 1 tablet (25 mg total) by mouth daily as needed for erectile dysfunction.  Dispense: 10 tablet; Refill: 0   3. HTN (hypertension), benign  - AMB Referral to Pharmacy Medication Management   Follow up:  Follow up in 3 months

## 2022-08-27 ENCOUNTER — Other Ambulatory Visit: Payer: Self-pay

## 2022-09-01 ENCOUNTER — Telehealth: Payer: Self-pay

## 2022-09-01 NOTE — Progress Notes (Signed)
   Care Guide Note  09/01/2022 Name: Cranford Sifuentez MRN: [id number] DOB: [birth year]  Referred by: Ivonne Andrew, NP Reason for referral : Care Coordination (Outreach to schedule with pharm d July Fontaine No)   Jaice Abate is a 52 y.o. year old male who is a primary care patient of Ivonne Andrew, NP. Jaydis William was referred to the pharmacist for assistance related to HTN and DM.    An unsuccessful telephone outreach was attempted today to contact the patient who was referred to the pharmacy team for assistance with medication management. Additional attempts will be made to contact the patient.   Penne Lash, RMA Care Guide [facility]  [place], [place] [address] Direct Dial: [phone] [email or web address]

## 2022-09-04 NOTE — Progress Notes (Unsigned)
   Care Guide Note  09/04/2022 Name: Donald Wheeler MRN: [id number] DOB: 11/27/1970  Referred by: Ivonne Andrew, NP Reason for referral : Care Coordination (Outreach to schedule with pharm d July Fontaine No)   [name] is a 52 y.o. year old male who is a primary care patient of Ivonne Andrew, NP. Laurel Smeltz was referred to the pharmacist for assistance related to DM.    A second unsuccessful telephone outreach was attempted today to contact the patient who was referred to the pharmacy team for assistance with medication management. Additional attempts will be made to contact the patient.  Penne Lash, RMA Care Guide [facility]  [place], [place] [address] Direct Dial: [phone] [email or web address]

## 2022-09-08 NOTE — Progress Notes (Signed)
   Care Guide Note  09/08/2022 Name: [name] MRN: [id number] DOB: [birth year]  Referred by: Ivonne Andrew, NP Reason for referral : Care Coordination (Outreach to schedule with pharm d July Fontaine No)   [name] is a 52 y.o. year old male who is a primary care patient of Ivonne Andrew, NP. Bright Spielmann was referred to the pharmacist for assistance related to DM.    A third unsuccessful telephone outreach was attempted today to contact the patient who was referred to the pharmacy team for assistance with medication management. The Population Health team is pleased to engage with this patient at any time in the future upon receipt of referral and should he/she be interested in assistance from the [facility] team.   Penne Lash, RMA Care Guide [facility]  [place], [place] [address] Direct Dial: [phone] [email or web address]

## 2022-09-22 ENCOUNTER — Other Ambulatory Visit (HOSPITAL_COMMUNITY): Payer: Self-pay

## 2022-09-24 ENCOUNTER — Other Ambulatory Visit: Payer: Self-pay | Admitting: Nurse Practitioner

## 2022-09-24 DIAGNOSIS — E1165 Type 2 diabetes mellitus with hyperglycemia: Secondary | ICD-10-CM

## 2022-09-24 NOTE — Telephone Encounter (Signed)
Caller & Relationship to patient:  MRN #  [id number]   Call Back Number:   Date of Last Office Visit: 08/26/2022     Date of Next Office Visit: 11/26/2022    Medication(s) to be Refilled: Atorvastatin and Bp MED  U map  to get his stuff free  Preferred Pharmacy:   ** Please notify patient to allow 48-72 hours to process** **Let patient know to contact pharmacy at the end of the day to make sure medication is ready. ** **If patient has not been seen in a year or longer, book an appointment **Advise to use MyChart for refill requests OR to contact their pharmacy

## 2022-10-29 ENCOUNTER — Other Ambulatory Visit: Payer: Self-pay

## 2022-10-29 ENCOUNTER — Ambulatory Visit (INDEPENDENT_AMBULATORY_CARE_PROVIDER_SITE_OTHER): Payer: 59 | Admitting: Internal Medicine

## 2022-10-29 ENCOUNTER — Other Ambulatory Visit (HOSPITAL_COMMUNITY): Payer: Self-pay

## 2022-10-29 ENCOUNTER — Encounter: Payer: Self-pay | Admitting: Internal Medicine

## 2022-10-29 VITALS — BP 129/85 | HR 78 | Temp 98.2°F | Wt 177.0 lb

## 2022-10-29 DIAGNOSIS — N183 Chronic kidney disease, stage 3 unspecified: Secondary | ICD-10-CM | POA: Diagnosis not present

## 2022-10-29 DIAGNOSIS — B20 Human immunodeficiency virus [HIV] disease: Secondary | ICD-10-CM | POA: Diagnosis not present

## 2022-10-29 DIAGNOSIS — F430 Acute stress reaction: Secondary | ICD-10-CM

## 2022-10-29 NOTE — Progress Notes (Signed)
Patient ID: Donald Wheeler, male   DOB: 1970/04/12, 52 y.o.   MRN: [id number]  HPI Donald Wheeler is a 52yo M with HIV disease, CD 4 count of 456/VL<20 on biktarvy, he reports that he has increasing stressors with only alittle bit of out of pocket money once child support is pulled; currently bouncing around with friend  ROS: unintentional weight loss - loss of appetite due to stress.   Outpatient Encounter Medications as of 10/29/2022  Medication Sig   amLODipine (NORVASC) 10 MG tablet TAKE 1 TABLET(10 MG) BY MOUTH DAILY   ASPIRIN LOW DOSE 81 MG tablet TAKE 1 TABLET(81 MG) BY MOUTH DAILY   bictegravir-emtricitabine-tenofovir AF (BIKTARVY) 50-200-25 MG TABS tablet Take 1 tablet by mouth daily.   metFORMIN (GLUCOPHAGE) 1000 MG tablet TAKE 1 TABLET(1000 MG) BY MOUTH TWICE DAILY WITH A MEAL   sildenafil (VIAGRA) 25 MG tablet Take 1 tablet (25 mg total) by mouth daily as needed for erectile dysfunction.   blood glucose meter kit and supplies KIT Dispense based on patient and insurance preference. Use up to four times daily as directed. (Patient not taking: Reported on 10/29/2022)   blood glucose meter kit and supplies Dispense based on patient and insurance preference. Use up to four times daily as directed. (FOR ICD-10 E10.9, E11.9). (Patient not taking: Reported on 10/29/2022)   empagliflozin (JARDIANCE) 10 MG TABS tablet Take 1 tablet (10 mg total) by mouth daily before breakfast. (Patient not taking: Reported on 10/29/2022)   hydrochlorothiazide (HYDRODIURIL) 25 MG tablet Take 0.5 tablets (12.5 mg total) by mouth daily. (Patient not taking: Reported on 10/29/2022)   losartan (COZAAR) 50 MG tablet Take 1 tablet (50 mg total) by mouth daily. (Patient not taking: Reported on 10/29/2022)   mirtazapine (REMERON) 15 MG tablet Take 1 tablet (15 mg total) by mouth at bedtime. (Patient not taking: Reported on 08/26/2022)   rosuvastatin (CRESTOR) 20 MG tablet Take 1 tablet (20 mg total) by mouth daily. (Patient not  taking: Reported on 10/29/2022)   No facility-administered encounter medications on file as of 10/29/2022.     Patient Active Problem List   Diagnosis Date Noted   Uncontrolled type 2 diabetes mellitus with hyperglycemia (HCC) 08/04/2021   Healthcare maintenance 02/19/2021   PSA elevation 02/05/2021   Persistent microscopic hematuria 02/05/2021   Controlled type 2 diabetes mellitus with microalbuminuria, with long-term current use of insulin (HCC) 08/09/2019   Vitamin D deficiency 05/10/2019   Shingles 07/2018   Hyperosmolar non-ketotic state in patient with type 2 diabetes mellitus (HCC) 12/23/2017   Diabetes mellitus, new onset (HCC) 12/23/2017   History of hematuria 08/16/2017   Mixed hyperlipidemia 06/16/2017   Cerebral thrombosis with cerebral infarction 04/10/2017   Solitary pulmonary nodule 04/10/2017   Focal neurological deficit 04/09/2017   CKD (chronic kidney disease) stage 3, GFR 30-59 ml/min 04/09/2017   Smoker 04/09/2017   HTN (hypertension), benign 09/07/2013   HIV (human immunodeficiency virus infection) (HCC) 03/17/2012     Health Maintenance Due  Topic Date Due   Zoster Vaccines- Shingrix (1 of 2) Never done   Fecal DNA (Cologuard)  Never done   OPHTHALMOLOGY EXAM  08/27/2021   COVID-19 Vaccine (4 - 2023-24 season) 11/14/2021   FOOT EXAM  02/03/2022   DTaP/Tdap/Td (3 - Td or Tdap) 02/21/2022   INFLUENZA VACCINE  10/15/2022     Review of Systems 12 point ros is negative except for stress Physical Exam   BP 129/85   Pulse 78   Temp 98.2  F (36.8 C) (Oral)   Wt 177 lb (80.3 kg)   SpO2 97%   BMI 27.72 kg/m   Physical Exam  Constitutional: He is oriented to person, place, and time. He appears well-developed and well-nourished. No distress.  HENT:  Mouth/Throat: Oropharynx is clear and moist. No oropharyngeal exudate.  Cardiovascular: Normal rate, regular rhythm and normal heart sounds. Exam reveals no gallop and no friction rub.  No murmur heard.   Pulmonary/Chest: Effort normal and breath sounds normal. No respiratory distress. He has no wheezes.  Abdominal: Soft. Bowel sounds are normal. He exhibits no distension. There is no tenderness.  Lymphadenopathy:  He has no cervical adenopathy.  Neurological: He is alert and oriented to person, place, and time.  Skin: Skin is warm and dry. No rash noted. No erythema.  Psychiatric: He has a normal mood and affect. His behavior is normal.   Lab Results  Component Value Date   CD4TCELL 24 (L) 07/30/2022   Lab Results  Component Value Date   CD4TABS 456 07/30/2022   CD4TABS 488 07/22/2021   CD4TABS 554 02/19/2021   Lab Results  Component Value Date   HIV1RNAQUANT <20 (H) 07/30/2022   Lab Results  Component Value Date   HEPBSAB NONREACTIVE 03/03/2012   Lab Results  Component Value Date   LABRPR NON-REACTIVE 07/30/2022    CBC Lab Results  Component Value Date   WBC 6.1 07/30/2022   RBC 5.29 07/30/2022   HGB 15.9 07/30/2022   HCT 46.7 07/30/2022   PLT 213 07/30/2022   MCV 88.3 07/30/2022   MCH 30.1 07/30/2022   MCHC 34.0 07/30/2022   RDW 14.2 07/30/2022   LYMPHSABS 2,147 07/30/2022   MONOABS 0.6 01/06/2021   EOSABS 128 07/30/2022    BMET Lab Results  Component Value Date   NA 143 07/30/2022   K 4.5 07/30/2022   CL 106 07/30/2022   CO2 30 07/30/2022   GLUCOSE 99 07/30/2022   BUN 19 07/30/2022   CREATININE 1.73 (H) 07/30/2022   CALCIUM 9.3 07/30/2022   GFRNONAA >60 01/06/2021   GFRAA 61 07/15/2020      Assessment and Plan HIV disease= continue on biktarvy. Plan to check labs  Long term medication management /CKD 3 = check cr  Possible homelessness = will plan to  Refer housing coordinator;  Acute stress = referral for counseling at family services  Htn = well controlled

## 2022-10-30 LAB — T-HELPER CELL (CD4) - (RCID CLINIC ONLY)
CD4 % Helper T Cell: 27 % — ABNORMAL LOW (ref 33–65)
CD4 T Cell Abs: 612 /uL (ref 400–1790)

## 2022-10-31 LAB — RPR: RPR Ser Ql: NONREACTIVE

## 2022-10-31 LAB — BASIC METABOLIC PANEL
BUN/Creatinine Ratio: 12 (calc) (ref 6–22)
BUN: 17 mg/dL (ref 7–25)
CO2: 28 mmol/L (ref 20–32)
Calcium: 8.8 mg/dL (ref 8.6–10.3)
Chloride: 107 mmol/L (ref 98–110)
Creat: 1.38 mg/dL — ABNORMAL HIGH (ref 0.70–1.30)
Glucose, Bld: 104 mg/dL — ABNORMAL HIGH (ref 65–99)
Potassium: 4.1 mmol/L (ref 3.5–5.3)
Sodium: 143 mmol/L (ref 135–146)

## 2022-10-31 LAB — HIV-1 RNA QUANT-NO REFLEX-BLD
HIV 1 RNA Quant: <20 {copies}/mL — ABNORMAL HIGH
HIV-1 RNA Quant, Log: <1.3 {Log_copies}/mL — ABNORMAL HIGH

## 2022-11-03 ENCOUNTER — Ambulatory Visit: Payer: 59 | Admitting: Internal Medicine

## 2022-11-26 ENCOUNTER — Encounter: Payer: Self-pay | Admitting: Nurse Practitioner

## 2022-11-26 ENCOUNTER — Other Ambulatory Visit: Payer: 59 | Admitting: Pharmacist

## 2022-11-26 ENCOUNTER — Other Ambulatory Visit (HOSPITAL_COMMUNITY): Payer: Self-pay

## 2022-11-26 ENCOUNTER — Ambulatory Visit: Payer: 59 | Admitting: Nurse Practitioner

## 2022-11-26 VITALS — BP 136/73 | HR 70 | Temp 97.2°F | Wt 184.0 lb

## 2022-11-26 DIAGNOSIS — E1165 Type 2 diabetes mellitus with hyperglycemia: Secondary | ICD-10-CM | POA: Diagnosis not present

## 2022-11-26 DIAGNOSIS — I10 Essential (primary) hypertension: Secondary | ICD-10-CM

## 2022-11-26 DIAGNOSIS — R1012 Left upper quadrant pain: Secondary | ICD-10-CM

## 2022-11-26 LAB — POCT URINALYSIS DIP (CLINITEK)
Bilirubin, UA: NEGATIVE
Glucose, UA: NEGATIVE mg/dL
Ketones, POC UA: NEGATIVE mg/dL
Leukocytes, UA: NEGATIVE
Nitrite, UA: NEGATIVE
POC PROTEIN,UA: >=300 — AB
Spec Grav, UA: 1.02 (ref 1.010–1.025)
Urobilinogen, UA: 0.2 U/dL
pH, UA: 6.5 (ref 5.0–8.0)

## 2022-11-26 LAB — POCT GLYCOSYLATED HEMOGLOBIN (HGB A1C): Hemoglobin A1C: 6.5 % — AB (ref 4.0–5.6)

## 2022-11-26 MED ORDER — EMPAGLIFLOZIN 10 MG PO TABS
10.0000 mg | ORAL_TABLET | Freq: Every day | ORAL | 3 refills | Status: DC
Start: 2022-11-26 — End: 2022-11-26

## 2022-11-26 MED ORDER — EMPAGLIFLOZIN 10 MG PO TABS
10.0000 mg | ORAL_TABLET | Freq: Every day | ORAL | 3 refills | Status: DC
Start: 2022-11-26 — End: 2022-12-22
  Filled 2022-11-26: qty 90, 90d supply, fill #0

## 2022-11-26 MED ORDER — ROSUVASTATIN CALCIUM 20 MG PO TABS: 20.0000 mg | ORAL_TABLET | Freq: Every day | ORAL | 11 refills | Status: DC

## 2022-11-26 MED ORDER — HYDROCHLOROTHIAZIDE 25 MG PO TABS: 12.5000 mg | ORAL_TABLET | Freq: Every day | ORAL | 6 refills | Status: DC

## 2022-11-26 MED ORDER — LOSARTAN POTASSIUM 50 MG PO TABS
50.0000 mg | ORAL_TABLET | Freq: Every day | ORAL | 1 refills | Status: DC
  Filled 2022-11-26: qty 90, 90d supply, fill #0

## 2022-11-26 MED ORDER — MIRTAZAPINE 15 MG PO TABS: 15.0000 mg | ORAL_TABLET | Freq: Every day | ORAL | 11 refills | Status: DC

## 2022-11-26 MED ORDER — ROSUVASTATIN CALCIUM 20 MG PO TABS
20.0000 mg | ORAL_TABLET | Freq: Every day | ORAL | 1 refills | Status: DC
  Filled 2022-11-26: qty 90, 90d supply, fill #0

## 2022-11-26 MED ORDER — HYDROCHLOROTHIAZIDE 12.5 MG PO TABS
12.5000 mg | ORAL_TABLET | Freq: Every day | ORAL | 3 refills | Status: DC
  Filled 2022-11-26: qty 90, 90d supply, fill #0

## 2022-11-26 MED ORDER — LOSARTAN POTASSIUM 50 MG PO TABS: 50.0000 mg | ORAL_TABLET | Freq: Every day | ORAL | 11 refills | Status: DC

## 2022-11-26 MED ORDER — MIRTAZAPINE 15 MG PO TABS
15.0000 mg | ORAL_TABLET | Freq: Every day | ORAL | 1 refills | Status: DC
  Filled 2022-11-26: qty 90, 90d supply, fill #0

## 2022-11-26 MED ORDER — ATORVASTATIN CALCIUM 80 MG PO TABS
80.0000 mg | ORAL_TABLET | Freq: Every day | ORAL | 3 refills | Status: DC
  Filled 2022-11-26: qty 90, 90d supply, fill #0

## 2022-11-26 MED ORDER — METFORMIN HCL 1000 MG PO TABS
1000.0000 mg | ORAL_TABLET | Freq: Two times a day (BID) | ORAL | 3 refills | Status: DC
  Filled 2022-11-26: qty 180, 90d supply, fill #0

## 2022-11-26 NOTE — Progress Notes (Signed)
Subjective   Patient ID: Donald Wheeler, male    DOB: Jan 12, 1971, 52 y.o.   MRN: [id number]  Chief Complaint  Patient presents with   Diabetes   Hypertension    Referring provider: Ivonne Andrew, NP  Donald Wheeler is a 52 y.o. male with Past Medical History: 09/07/2013: Bell's palsy 12/2017: Diabetes mellitus without complication (HCC)     Comment:  NEW ONSET  No date: HIV (human immunodeficiency virus infection) (HCC)     Comment:  dx'd 2000s No date: Hypertension 04/09/2017: Stroke [facility])     Comment:  vs TIA w/acute onset of right-sided weakness as well as               trouble speaking/notes 04/09/2017  HPI: Diabetes He presents for his follow-up diabetic visit. He has type 2 diabetes mellitus. No MedicAlert identification noted. His disease course has been stable. There are no hypoglycemic associated symptoms. There are no diabetic associated symptoms. There are no hypoglycemic complications. Symptoms are stable. Risk factors for coronary artery disease include diabetes mellitus, hypertension, male sex and sedentary lifestyle. He is compliant with treatment some of the time. He is following a diabetic diet. Meal planning includes avoidance of concentrated sweets. He has not had a previous visit with a dietitian. He rarely participates in exercise.  Hypertension This is a chronic problem. The problem is unchanged. The problem is controlled. There are no associated agents to hypertension. Risk factors for coronary artery disease include diabetes mellitus, male gender, sedentary lifestyle and family history. Compliance problems include medication cost.   Abdominal Pain This is a new problem. The current episode started 1 to 4 weeks ago. The onset quality is sudden. The problem occurs constantly. The problem has been unchanged. The pain is located in the LLQ and LUQ. The pain is at a severity of 10/10. The pain is severe. The quality of the pain is sharp. The abdominal pain radiates to  the LLQ and LUQ. Pertinent negatives include no constipation, diarrhea, nausea or vomiting. The pain is aggravated by deep breathing and movement. The pain is relieved by Being still. He has tried nothing for the symptoms.    No Known Allergies  Immunization History  Administered Date(s) Administered   Hepatitis A 02/22/2012   Hepatitis A, Adult 02/20/2013   Hepatitis B 02/22/2012, 03/17/2012   Influenza Split 03/17/2012   Influenza Whole 03/17/2019   Influenza,inj,Quad PF,6+ Mos 02/20/2013, 01/03/2014, 11/07/2018, 01/26/2022   Meningococcal Mcv4o 05/11/2016   PFIZER(Purple Top)SARS-COV-2 Vaccination 05/29/2019, 06/20/2019, 01/05/2020   PNEUMOCOCCAL CONJUGATE-20 07/22/2021   Pneumococcal Polysaccharide-23 03/17/2012   Td 02/22/2012   Tdap 02/22/2012    Tobacco History: Social History   Tobacco Use  Smoking Status Some Days   Current packs/day: 0.00   Average packs/day: 0.2 packs/day for 15.0 years (3.0 ttl pk-yrs)   Types: Cigars, Cigarettes   Start date: 12/13/2002   Last attempt to quit: 12/12/2017   Years since quitting: 4.9  Smokeless Tobacco Never  Tobacco Comments   black and milds daily   Ready to quit: Not Answered Counseling given: Not Answered Tobacco comments: black and milds daily   Outpatient Encounter Medications as of 11/26/2022  Medication Sig   amLODipine (NORVASC) 10 MG tablet TAKE 1 TABLET(10 MG) BY MOUTH DAILY   ASPIRIN LOW DOSE 81 MG tablet TAKE 1 TABLET(81 MG) BY MOUTH DAILY   atorvastatin (LIPITOR) 80 MG tablet Take 80 mg by mouth daily.   bictegravir-emtricitabine-tenofovir AF (BIKTARVY) 50-200-25 MG TABS tablet Take 1  tablet by mouth daily.   metFORMIN (GLUCOPHAGE) 1000 MG tablet TAKE 1 TABLET(1000 MG) BY MOUTH TWICE DAILY WITH A MEAL   blood glucose meter kit and supplies Dispense based on patient and insurance preference. Use up to four times daily as directed. (FOR ICD-10 E10.9, E11.9). (Patient not taking: Reported on 10/29/2022)    empagliflozin (JARDIANCE) 10 MG TABS tablet Take 1 tablet (10 mg total) by mouth daily before breakfast.   hydrochlorothiazide (HYDRODIURIL) 25 MG tablet Take 0.5 tablets (12.5 mg total) by mouth daily.   losartan (COZAAR) 50 MG tablet Take 1 tablet (50 mg total) by mouth daily.   mirtazapine (REMERON) 15 MG tablet Take 1 tablet (15 mg total) by mouth at bedtime.   rosuvastatin (CRESTOR) 20 MG tablet Take 1 tablet (20 mg total) by mouth daily.   sildenafil (VIAGRA) 25 MG tablet Take 1 tablet (25 mg total) by mouth daily as needed for erectile dysfunction.   [DISCONTINUED] blood glucose meter kit and supplies KIT Dispense based on patient and insurance preference. Use up to four times daily as directed. (Patient not taking: Reported on 10/29/2022)   [DISCONTINUED] empagliflozin (JARDIANCE) 10 MG TABS tablet Take 1 tablet (10 mg total) by mouth daily before breakfast. (Patient not taking: Reported on 10/29/2022)   [DISCONTINUED] hydrochlorothiazide (HYDRODIURIL) 25 MG tablet Take 0.5 tablets (12.5 mg total) by mouth daily. (Patient not taking: Reported on 10/29/2022)   [DISCONTINUED] losartan (COZAAR) 50 MG tablet Take 1 tablet (50 mg total) by mouth daily. (Patient not taking: Reported on 10/29/2022)   [DISCONTINUED] mirtazapine (REMERON) 15 MG tablet Take 1 tablet (15 mg total) by mouth at bedtime. (Patient not taking: Reported on 08/26/2022)   [DISCONTINUED] rosuvastatin (CRESTOR) 20 MG tablet Take 1 tablet (20 mg total) by mouth daily. (Patient not taking: Reported on 10/29/2022)   No facility-administered encounter medications on file as of 11/26/2022.    Review of Systems  Review of Systems  Constitutional: Negative.   HENT: Negative.    Cardiovascular: Negative.   Gastrointestinal:  Positive for abdominal pain. Negative for constipation, diarrhea, nausea and vomiting.  Allergic/Immunologic: Negative.   Neurological: Negative.   Psychiatric/Behavioral: Negative.       Objective:   BP  136/73   Pulse 70   Temp (!) 97.2 F (36.2 C)   Wt 184 lb (83.5 kg)   SpO2 100%   BMI 28.82 kg/m   Wt Readings from Last 5 Encounters:  11/26/22 184 lb (83.5 kg)  10/29/22 177 lb (80.3 kg)  08/26/22 182 lb 12.8 oz (82.9 kg)  07/30/22 176 lb (79.8 kg)  05/25/22 181 lb 3.2 oz (82.2 kg)     Physical Exam Vitals and nursing note reviewed.  Constitutional:      General: He is not in acute distress.    Appearance: He is well-developed.  Cardiovascular:     Rate and Rhythm: Normal rate and regular rhythm.  Pulmonary:     Effort: Pulmonary effort is normal.     Breath sounds: Normal breath sounds.  Skin:    General: Skin is warm and dry.  Neurological:     Mental Status: He is alert and oriented to person, place, and time.       Assessment & Plan:   Uncontrolled type 2 diabetes mellitus with hyperglycemia (HCC) -     Empagliflozin; Take 1 tablet (10 mg total) by mouth daily before breakfast.  Dispense: 90 tablet; Refill: 3  HTN (hypertension), benign  Left upper quadrant abdominal pain -  US Abdomen Complete; Future  Other orders -     Losartan Potassium; Take 1 tablet (50 mg total) by mouth daily.  Dispense: 30 tablet; Refill: 11 -     hydroCHLOROthiazide; Take 0.5 tablets (12.5 mg total) by mouth daily.  Dispense: 30 tablet; Refill: 6 -     Mirtazapine; Take 1 tablet (15 mg total) by mouth at bedtime.  Dispense: 30 tablet; Refill: 11 -     Rosuvastatin Calcium; Take 1 tablet (20 mg total) by mouth daily.  Dispense: 30 tablet; Refill: 11     Return in about 3 months (around 02/25/2023).   Ivonne Andrew, NP 11/26/2022

## 2022-11-26 NOTE — Patient Instructions (Signed)
1. Uncontrolled type 2 diabetes mellitus with hyperglycemia (HCC)  - empagliflozin (JARDIANCE) 10 MG TABS tablet; Take 1 tablet (10 mg total) by mouth daily before breakfast.  Dispense: 90 tablet; Refill: 3  2. HTN (hypertension), benign -low salt diet  3. Left upper quadrant abdominal pain  - US Abdomen Complete; Future  Follow up:  Follow up in 3 months

## 2022-11-26 NOTE — Patient Instructions (Addendum)
Right now, your imaging is scheduled:   October 1st at 7:40 am   [address]   If you want to call your insurance to see if there is a different imaging place in network (so probably cheaper) you can call:  [phone].  If you find a place that is in network with your insurance, please call the office.    Please talk to your supervisor about enrolling in [facility] insurance next month.

## 2022-11-26 NOTE — Progress Notes (Signed)
Care Coordination Call  Discussed with patient while in office. Unable to communicate with [facility] to discuss copays. Scripts sent to [facility].   Patient notes he was not paying for medications previously. Discussed with [facility] pharmacist - he is approved for [employer] so medications on formulary at [facility] are free. Patient notes he does not want to wait for [facility] to fill his prescriptions, so will fill the ones he is out of at [facility] today.   Will follow up in a few weeks.   Catie Eppie Gibson, PharmD, BCACP, CPP Clinical Pharmacist [facility] [phone]

## 2022-12-02 ENCOUNTER — Other Ambulatory Visit (HOSPITAL_COMMUNITY): Payer: Self-pay

## 2022-12-02 ENCOUNTER — Other Ambulatory Visit: Payer: Self-pay

## 2022-12-02 DIAGNOSIS — B20 Human immunodeficiency virus [HIV] disease: Secondary | ICD-10-CM

## 2022-12-02 DIAGNOSIS — I10 Essential (primary) hypertension: Secondary | ICD-10-CM

## 2022-12-02 MED ORDER — BIKTARVY 50-200-25 MG PO TABS: 1.0000 | ORAL_TABLET | Freq: Every day | ORAL | 5 refills | Status: DC

## 2022-12-02 MED ORDER — AMLODIPINE BESYLATE 10 MG PO TABS: ORAL_TABLET | ORAL | 5 refills | Status: DC

## 2022-12-03 ENCOUNTER — Other Ambulatory Visit (HOSPITAL_COMMUNITY): Payer: Self-pay

## 2022-12-04 ENCOUNTER — Other Ambulatory Visit (HOSPITAL_COMMUNITY): Payer: Self-pay

## 2022-12-07 ENCOUNTER — Other Ambulatory Visit (HOSPITAL_COMMUNITY): Payer: Self-pay

## 2022-12-12 ENCOUNTER — Other Ambulatory Visit (HOSPITAL_COMMUNITY): Payer: Self-pay

## 2022-12-15 ENCOUNTER — Other Ambulatory Visit: Payer: 59

## 2022-12-18 ENCOUNTER — Other Ambulatory Visit: Payer: Self-pay | Admitting: Nurse Practitioner

## 2022-12-18 DIAGNOSIS — Z1211 Encounter for screening for malignant neoplasm of colon: Secondary | ICD-10-CM

## 2022-12-18 DIAGNOSIS — Z1212 Encounter for screening for malignant neoplasm of rectum: Secondary | ICD-10-CM

## 2022-12-22 ENCOUNTER — Other Ambulatory Visit (HOSPITAL_COMMUNITY): Payer: Self-pay

## 2022-12-22 ENCOUNTER — Other Ambulatory Visit: Payer: 59 | Admitting: Pharmacist

## 2022-12-22 DIAGNOSIS — I10 Essential (primary) hypertension: Secondary | ICD-10-CM

## 2022-12-22 DIAGNOSIS — E1165 Type 2 diabetes mellitus with hyperglycemia: Secondary | ICD-10-CM

## 2022-12-22 MED ORDER — AMLODIPINE BESYLATE 10 MG PO TABS: 10.0000 mg | ORAL_TABLET | Freq: Every day | ORAL | 1 refills | Status: DC

## 2022-12-22 MED ORDER — MIRTAZAPINE 15 MG PO TABS: 15.0000 mg | ORAL_TABLET | Freq: Every day | ORAL | 1 refills | Status: DC

## 2022-12-22 MED ORDER — METFORMIN HCL 1000 MG PO TABS: 1000.0000 mg | ORAL_TABLET | Freq: Every day | ORAL | 3 refills | Status: DC

## 2022-12-22 MED ORDER — LOSARTAN POTASSIUM 50 MG PO TABS: 50.0000 mg | ORAL_TABLET | Freq: Every day | ORAL | 1 refills | Status: DC

## 2022-12-22 MED ORDER — HYDROCHLOROTHIAZIDE 12.5 MG PO TABS: 12.5000 mg | ORAL_TABLET | Freq: Every day | ORAL | 3 refills | Status: DC

## 2022-12-22 MED ORDER — ATORVASTATIN CALCIUM 80 MG PO TABS: 80.0000 mg | ORAL_TABLET | Freq: Every day | ORAL | 3 refills | Status: DC

## 2022-12-22 NOTE — Patient Instructions (Signed)
Donald Wheeler,   When you look at benefits options, take into consideration the cost of your medications and needing eye coverage (for glasses).   Check your blood sugars periodically: 1) Fasting, first thing in the morning before breakfast and  2) 2 hours after your largest meal.   For a goal A1c of less than 7%, goal fasting readings are less than 130 and goal 2 hour after meal readings are less than 180.    Talk to Dr. Drue Second about the nerve pain in your feet.   Thanks!  Catie Clearance Coots, PharmD

## 2022-12-22 NOTE — Progress Notes (Signed)
12/22/2022 Name: Donald Wheeler MRN: [id number] DOB: 02/05/1971  Chief Complaint  Patient presents with   Medication Management   Diabetes   Hypertension    Donald Wheeler is a 52 y.o. year old male who presented for a telephone visit.   They were referred to the pharmacist by their PCP for assistance in managing diabetes, hypertension, hyperlipidemia, and medication access.    Subjective:  Care Team: Primary Care Provider: Ivonne Andrew, NP ; Next Scheduled Visit: 02/25/23 Infectious Disease: Drue Second; Next Schedule Visit: 01/28/23  Medication Access/Adherence  [facility]  [id number] [place], [place] - [address] AT [facility] [address] [place] [address] Phone: [phone] Fax: [phone]   Patient reports affordability concerns with their medications: No  Patient reports access/transportation concerns to their pharmacy: Yes  Patient reports adherence concerns with their medications:  No    Patient has [employer]/[facility]P program[facility]. Notes he does not like using [facility] because it takes a while to fill anything/pick anything up, but understands he is limited until he picks up a new insurance plan.   Works for [employer], plans to enroll tomorrow for 2025.    Diabetes:  Current medications: metformin 1000 mg twice daily - only taking once daily; prescribed Jardiance 10 mg daily but unable to afford (not on HMAPP formulary)  If needed in the future, [name] is on HMAPP formulary.   Hypertension:  Current medications: amlodipine 10 mg daily, hydrochlorothiazide 12.5 mg daily, losartan 50 mg daily   Patient does not have a validated, automated, upper arm home BP cuff  Patient denies hypotensive s/sx including dizziness, lightheadedness.   Hyperlipidemia/ASCVD Risk Reduction  Current lipid lowering medications: atorvastatin 80 mg daily   Antiplatelet regimen: needs to pick up a new bottle of  aspirin. No coverage for aspirin on HMAPP or [facility] formulary  HIV: Current medications: Biktarvy 50/200/25 mg daily  Reports pins/needles and tingling pain in his feet. Notes he has not discussed this with Dr. Drue Second before.   Has not picked up mirtazapine. This is on HMAPP formulary. Does not report sleep concerns/mood concerns at this time  Objective:  Lab Results  Component Value Date   HGBA1C 6.5 (A) 11/26/2022    Lab Results  Component Value Date   CREATININE 1.38 (H) 10/29/2022   BUN 17 10/29/2022   NA 143 10/29/2022   K 4.1 10/29/2022   CL 107 10/29/2022   CO2 28 10/29/2022    Lab Results  Component Value Date   CHOL 141 07/30/2022   HDL 41 07/30/2022   LDLCALC 76 07/30/2022   LDLDIRECT 124 (H) 08/16/2017   TRIG 142 07/30/2022   CHOLHDL 3.4 07/30/2022    Medications Reviewed Today     Reviewed by Alden Hipp, RPH-CPP (Pharmacist) on 12/22/22 at 1037  Med List Status: <None>   Medication Order Taking? Sig Documenting Provider Last Dose Status Informant  amLODipine (NORVASC) 10 MG tablet [id number] Yes TAKE 1 TABLET(10 MG) BY MOUTH DAILY Judyann Munson, MD Taking Active   ASPIRIN LOW DOSE 81 MG tablet [id number] No TAKE 1 TABLET(81 MG) BY MOUTH DAILY  Patient not taking: Reported on 12/22/2022   Ivonne Andrew, NP Not Taking Active   atorvastatin (LIPITOR) 80 MG tablet [id number] Yes Take 1 tablet (80 mg total) by mouth daily. Ivonne Andrew, NP Taking Active   bictegravir-emtricitabine-tenofovir AF (BIKTARVY) 50-200-25 MG TABS tablet [id number] Yes Take 1 tablet by mouth daily.  Judyann Munson, MD Taking Active   blood glucose meter kit and supplies [id number]  Dispense based on patient and insurance preference. Use up to four times daily as directed. (FOR ICD-10 E10.9, E11.9).  Patient not taking: Reported on 10/29/2022   Ivonne Andrew, NP  Active   hydrochlorothiazide (HYDRODIURIL) 12.5 MG tablet [id number] Yes Take 1 tablet (12.5 mg total) by  mouth daily. Ivonne Andrew, NP Taking Active   losartan (COZAAR) 50 MG tablet [id number] Yes Take 1 tablet (50 mg total) by mouth daily. Ivonne Andrew, NP Taking Active   metFORMIN (GLUCOPHAGE) 1000 MG tablet [id number] Yes Take 1 tablet (1,000 mg total) by mouth 2 (two) times daily with a meal. Ivonne Andrew, NP Taking Active            Med Note Clearance Coots, Dalylah Ramey T   Tue Dec 22, 2022 10:36 AM) Taking once daily  mirtazapine (REMERON) 15 MG tablet [id number]  Take 1 tablet (15 mg total) by mouth at bedtime. Ivonne Andrew, NP  Active   sildenafil (VIAGRA) 25 MG tablet [id number] No Take 1 tablet (25 mg total) by mouth daily as needed for erectile dysfunction.  Patient not taking: Reported on 12/22/2022   Ivonne Andrew, NP Not Taking Active               Assessment/Plan:   Will collaborate with PCP to [facility] ([facility] formulary). Will collaborate with PCP to send refill for sildenafil to [facility] (more cost effective for patient).  Diabetes: - Currently controlled on 1000 mg daily. Biktarvy may increase concentrations of metformin, will collaborate with PCP to adjust to patient's dose of 1000 mg daily as opposed to 1000 mg twice daily.  - If future glucose lowering needed, could consider Farxiga (on HMAPP formulary) - Recommend to continue current regimen at this time - Recommend to check glucose periodically, fasting and 2 hour post prandial  Hypertension: - Currently controlled - Will collaborate with PCP to have [facility].  - Recommend to continue current regimen at this time.  Consider opportunities to consolidate therapy moving forward.   Hyperlipidemia/ASCVD Risk Reduction: - Currently uncontrolled but unclear which dose of statin he was taking at the time - Recommend to continue current regimen at this time. Update lipids with next visit.  - Will collaborate with PCP to have [facility]  for [facility].   Patient will investigate open enrollment options.    Follow Up Plan: phone call in  6 weeks  Catie TClearance Coots, PharmD, BCACP, CPP Clinical Pharmacist [facility] [phone]

## 2022-12-23 ENCOUNTER — Other Ambulatory Visit (HOSPITAL_COMMUNITY): Payer: Self-pay

## 2022-12-24 ENCOUNTER — Other Ambulatory Visit: Payer: Self-pay | Admitting: Nurse Practitioner

## 2022-12-24 ENCOUNTER — Other Ambulatory Visit (HOSPITAL_COMMUNITY): Payer: Self-pay

## 2022-12-24 DIAGNOSIS — N529 Male erectile dysfunction, unspecified: Secondary | ICD-10-CM

## 2022-12-24 MED ORDER — SILDENAFIL CITRATE 25 MG PO TABS
25.0000 mg | ORAL_TABLET | Freq: Every day | ORAL | 0 refills | Status: DC | PRN
  Filled 2022-12-24: qty 10, 10d supply, fill #0

## 2022-12-25 ENCOUNTER — Other Ambulatory Visit (HOSPITAL_COMMUNITY): Payer: Self-pay

## 2022-12-25 ENCOUNTER — Other Ambulatory Visit: Payer: Self-pay

## 2022-12-25 ENCOUNTER — Telehealth: Payer: Self-pay | Admitting: Nurse Practitioner

## 2022-12-25 DIAGNOSIS — N529 Male erectile dysfunction, unspecified: Secondary | ICD-10-CM

## 2022-12-25 MED ORDER — SILDENAFIL CITRATE 25 MG PO TABS
25.0000 mg | ORAL_TABLET | Freq: Every day | ORAL | 0 refills | Status: DC | PRN
Start: 2022-12-25 — End: 2022-12-28

## 2022-12-25 NOTE — Telephone Encounter (Signed)
Please advise K[facility] 

## 2022-12-25 NOTE — Telephone Encounter (Signed)
Prescription Request  12/25/2022  LOV: 11/26/2022  What is the name of the medication or equipment? VIAGRA GENERIC KIND   Have you contacted your pharmacy to request a refill? Yes   Which pharmacy would you like this sent to?  [facility]     Patient notified that their request is being sent to the clinical staff for review and that they should receive a response within 2 business days.   Please advise at [facility] [phone]

## 2022-12-28 ENCOUNTER — Other Ambulatory Visit: Payer: Self-pay | Admitting: Nurse Practitioner

## 2022-12-28 ENCOUNTER — Other Ambulatory Visit (HOSPITAL_COMMUNITY): Payer: Self-pay

## 2022-12-28 DIAGNOSIS — N529 Male erectile dysfunction, unspecified: Secondary | ICD-10-CM

## 2022-12-28 MED ORDER — SILDENAFIL CITRATE 25 MG PO TABS: 25.0000 mg | ORAL_TABLET | Freq: Every day | ORAL | 0 refills | Status: DC | PRN

## 2022-12-28 NOTE — Telephone Encounter (Signed)
Caller & Relationship to patient:  MRN #  [id number]   Call Back Number:   Date of Last Office Visit: 12/24/2022     Date of Next Office Visit: 12/25/2022    Medication(s) to be Refilled:  VIAGRA GENERIC KIND     Preferred Pharmacy:   ** Please notify patient to allow 48-72 hours to process** **Let patient know to contact pharmacy at the end of the day to make sure medication is ready. ** **If patient has not been seen in a year or longer, book an appointment **Advise to use MyChart for refill requests OR to contact their pharmacy

## 2023-01-28 ENCOUNTER — Ambulatory Visit: Payer: Self-pay | Admitting: Internal Medicine

## 2023-02-01 NOTE — Progress Notes (Unsigned)
02/02/2023 Name: Donald Wheeler MRN: [id number] DOB: [birth year]  Chief Complaint  Patient presents with   Diabetes   Hypertension   Hyperlipidemia    Donald Wheeler is a 52 y.o. year old male who presented for a telephone visit.   They were referred to the pharmacist by their PCP for assistance in managing diabetes, hypertension, and hyperlipidemia.    Subjective:  Care Team: Primary Care Provider: Ivonne Andrew, NP ; Next Scheduled Visit: 02/25/2023  Medication Access/Adherence  Current Pharmacy[facility]  [id number] [place], [place] - [address] AT [facility] [address] [place] [address] Phone: [phone] Fax: [phone]  [id number] - [place], [place] - [address] AT [facility] [address] [place] [address] Phone: [phone] Fax: [phone]   Patient reports affordability concerns with their medications: Yes  Patient reports access/transportation concerns to their pharmacy: Yes  Patient reports adherence concerns with their medications:  Yes     Diabetes:  Current medications: metformin 1000 mg once daily;  - Patient reports taking metformin twice daily   Current glucose readings: doesn't check, doesn't have meter - reports not being able to afford meter   Patient reports hypoglycemic s/sx including dizziness, shakiness, sweating sometimes. Patient reports hyperglycemic symptoms including nocturia 2-3 times/night.   Current meal patterns: 2 meals/day - Breakfast: cereal (Frosted Flakes) with milk, dry Cheeros  - Lunch: fruits (not a big appetite for lunch) - Supper: baked foods, plenty greens, carrots  - Drinks: coffee with creamer and sugar, water, regular soda (3-4 times/week)   Current physical activity: walks a lot   Current medication access support: HMAPP  Hypertension:  Current medications: amlodipine 10 mg daily,  hydrochlorothiazide 12.5 mg daily, losartan 50 mg daily   Patient does not have a validated, automated, upper arm home BP cuff  Hyperlipidemia/ASCVD Risk Reduction   Current lipid lowering medications: atorvastatin 80 mg daily    Antiplatelet regimen: needs to pick up a new bottle of aspirin. No coverage for aspirin on HMAPP or [facility] formulary  ASCVD History: cerebral infarction  Family History: DM, HTN (mother) Risk Factors: smoker, HTN, HLD, CKD, DM  The ASCVD Risk score (Arnett DK, et al., 2019) failed to calculate for the following reasons:   The patient has a prior MI or stroke diagnosis    HIV:  Current medications: Biktarvy 50/200/25 mg daily   Reports pins/needles and tingling pain in his feet at last visit. Notes he has not discussed this with Dr. Drue Second before. He had an appointment with Dr. Drue Second on 11/14 and was not able to make it.   Objective:  Lab Results  Component Value Date   HGBA1C 6.5 (A) 11/26/2022    Lab Results  Component Value Date   CREATININE 1.38 (H) 10/29/2022   BUN 17 10/29/2022   NA 143 10/29/2022   K 4.1 10/29/2022   CL 107 10/29/2022   CO2 28 10/29/2022    Lab Results  Component Value Date   CHOL 141 07/30/2022   HDL 41 07/30/2022   LDLCALC 76 07/30/2022   LDLDIRECT 124 (H) 08/16/2017   TRIG 142 07/30/2022   CHOLHDL 3.4 07/30/2022    Medications Reviewed Today     Reviewed by Alden Hipp, RPH-CPP (Pharmacist) on 02/02/23 at 1016  Med List Status: <None>   Medication Order Taking? Sig Documenting Provider Last Dose Status Informant  amLODipine (NORVASC) 10 MG  tablet [id number] Yes Take 1 tablet (10 mg total) by mouth daily. Ivonne Andrew, NP Taking Active   ASPIRIN LOW DOSE 81 MG tablet [id number] Yes TAKE 1 TABLET(81 MG) BY MOUTH DAILY Ivonne Andrew, NP Taking Active   atorvastatin (LIPITOR) 80 MG tablet [id number] Yes Take 1 tablet (80 mg total) by mouth daily. Ivonne Andrew, NP Taking Active    bictegravir-emtricitabine-tenofovir AF (BIKTARVY) 50-200-25 MG TABS tablet [id number] Yes Take 1 tablet by mouth daily. Judyann Munson, MD Taking Active    Patient not taking:   Discontinued 02/02/23 1014 (Completed Course)   hydrochlorothiazide (HYDRODIURIL) 12.5 MG tablet [id number] No Take 1 tablet (12.5 mg total) by mouth daily.  Patient not taking: Reported on 02/02/2023   Ivonne Andrew, NP Not Taking Active   losartan (COZAAR) 50 MG tablet [id number] Yes Take 1 tablet (50 mg total) by mouth daily. Ivonne Andrew, NP Taking Active   metFORMIN (GLUCOPHAGE) 1000 MG tablet [id number] Yes Take 1 tablet (1,000 mg total) by mouth daily with breakfast. Ivonne Andrew, NP Taking Active            Med Note Cornelius Moras, Brody Bonneau   Tue Feb 02, 2023 10:09 AM) Reports taking twice daily  mirtazapine (REMERON) 15 MG tablet [id number] Yes Take 1 tablet (15 mg total) by mouth at bedtime. Ivonne Andrew, NP Taking Active   sildenafil (VIAGRA) 25 MG tablet [id number] Yes Take 1 tablet (25 mg total) by mouth daily as needed for erectile dysfunction. Ivonne Andrew, NP Taking Active             Assessment/Plan:   Diabetes: - Currently controlled based on last A1c, however, patient reports nocturia and signs of hypoglycemia. Reports of nocturia may be unrelated to diabetes.  - Reviewed dietary modifications including eating less sugar by reducing regular sugar and swapping Frosted Flakes for Cheeros - Recommend to decrease metformin to 1,000 mg once daily since Biktarvy may increase concentrations of metformin. Previously adjusted order. - Sent in blood glucose meter for patient  - Recommend repeating A1c at next PCP visit in December   Hypertension: - Currently uncontrolled based on last office BP reading of 136/73 mmHg (goal <130/80 mmHg) - Patient does not currently have a blood pressure cuff. Offered to set one aside for patient and he agreed to pick up - Recommend to continue current regimen     Hyperlipidemia/ASCVD Risk Reduction: - Currently uncontrolled based on last LDL of 76 six months ago  - Reviewed long term complications of uncontrolled cholesterol - Recommend to continue current regimen   - Recommend repeating lipid panel at next PCP visit in December  - Meets financial criteria for patient assistance program through [employer] program  HIV: - Systems analyst. Collaborating with [facility] pharmacists to investigate access.  - Recommended rescheduling appointment with Dr. Drue Second   Social Determinant Concerns: - Patient reports reports concerns with not having housing and money - Sent referral for social workers help  - Assisted in medication access and refills for patient   Follow Up Plan: 02/25/2023 with PCP in-person   Roslyn Smiling, PharmD PGY1 Pharmacy Resident 02/02/2023 3:38 PM   I have reviewed the pharmacist's encounter and agree with their documentation.   Catie Eppie Gibson, PharmD, BCACP, CPP [facility] [phone]

## 2023-02-02 ENCOUNTER — Other Ambulatory Visit: Payer: Self-pay | Admitting: Pharmacist

## 2023-02-02 ENCOUNTER — Other Ambulatory Visit (HOSPITAL_COMMUNITY): Payer: Self-pay

## 2023-02-02 DIAGNOSIS — E1165 Type 2 diabetes mellitus with hyperglycemia: Secondary | ICD-10-CM

## 2023-02-02 DIAGNOSIS — Z21 Asymptomatic human immunodeficiency virus [HIV] infection status: Secondary | ICD-10-CM

## 2023-02-02 DIAGNOSIS — I10 Essential (primary) hypertension: Secondary | ICD-10-CM

## 2023-02-02 DIAGNOSIS — N529 Male erectile dysfunction, unspecified: Secondary | ICD-10-CM

## 2023-02-02 MED ORDER — HYDROCHLOROTHIAZIDE 12.5 MG PO TABS: 12.5000 mg | ORAL_TABLET | Freq: Every day | ORAL | 3 refills | Status: DC

## 2023-02-02 MED ORDER — AMLODIPINE BESYLATE 10 MG PO TABS: 10.0000 mg | ORAL_TABLET | Freq: Every day | ORAL | 3 refills | Status: DC

## 2023-02-02 MED ORDER — LANCETS MISC. MISC: 3 refills | Status: AC

## 2023-02-02 MED ORDER — ATORVASTATIN CALCIUM 80 MG PO TABS: 80.0000 mg | ORAL_TABLET | Freq: Every day | ORAL | 3 refills | Status: DC

## 2023-02-02 MED ORDER — BLOOD GLUCOSE MONITORING SUPPL DEVI: 0 refills | Status: DC

## 2023-02-02 MED ORDER — LANCET DEVICE MISC: 0 refills | Status: AC

## 2023-02-02 MED ORDER — SILDENAFIL CITRATE 25 MG PO TABS: 25.0000 mg | ORAL_TABLET | Freq: Every day | ORAL | 0 refills | Status: DC | PRN

## 2023-02-02 MED ORDER — LOSARTAN POTASSIUM 50 MG PO TABS: 50.0000 mg | ORAL_TABLET | Freq: Every day | ORAL | 1 refills | Status: DC

## 2023-02-02 MED ORDER — METFORMIN HCL 1000 MG PO TABS: 1000.0000 mg | ORAL_TABLET | Freq: Every day | ORAL | 3 refills | Status: AC

## 2023-02-02 MED ORDER — BLOOD GLUCOSE TEST VI STRP: ORAL_STRIP | 3 refills | Status: DC

## 2023-02-02 MED ORDER — AMLODIPINE BESYLATE 10 MG PO TABS
10.0000 mg | ORAL_TABLET | Freq: Every day | ORAL | 3 refills | Status: DC
Start: 2023-02-02 — End: 2023-02-10

## 2023-02-02 MED ORDER — MIRTAZAPINE 15 MG PO TABS: 15.0000 mg | ORAL_TABLET | Freq: Every day | ORAL | 1 refills | Status: DC

## 2023-02-02 NOTE — Patient Instructions (Addendum)
Hi Donald Wheeler,  It was great speaking with you today!  Decrease metformin to 1,000 mg (one tablet) daily. Continue other medications as normal  Reschedule appointment with Dr. Drue Second for follow-up  [facility] when [facility]   We placed a social work referral so hopefully they will contact you shortly. We are also continuing to work on medication access. Please let us know if you have any questions!

## 2023-02-02 NOTE — Progress Notes (Signed)
[facility] [employer] requires filling with them. Lupita Leash provided them with his ICAP information again to allow for $0 copay. Additional [facility] formulary items should be covered as well.   Margarite Gouge, PharmD, CPP, BCIDP, AAHIVP Clinical Pharmacist Practitioner Infectious Diseases Clinical Pharmacist [facility]

## 2023-02-03 ENCOUNTER — Telehealth: Payer: Self-pay | Admitting: *Deleted

## 2023-02-03 NOTE — Progress Notes (Signed)
  Care Coordination  Outreach Note  02/03/2023 Name: Donald Wheeler MRN: [id number] DOB: 1970/05/18   Care Coordination Outreach Attempts: An unsuccessful telephone outreach was attempted today to offer the patient information about available care coordination services.  Follow Up Plan:  Additional outreach attempts will be made to offer the patient care coordination information and services.   Encounter Outcome:  No Answer  Burman Nieves, CCMA Care Coordination Care Guide Direct Dial: [phone]

## 2023-02-05 NOTE — Progress Notes (Signed)
  Care Coordination   Note   02/05/2023 Name: Donald Wheeler MRN: [id number] DOB: 03/02/1971  Donald Wheeler is a 52 y.o. year old male who sees Ivonne Andrew, NP for primary care. I reached out to [name] by phone today to offer care coordination services.  Mr. Bruney was given information about Care Coordination services today including:   The Care Coordination services include support from the care team which includes your Nurse Coordinator, Clinical Social Worker, or Pharmacist.  The Care Coordination team is here to help remove barriers to the health concerns and goals most important to you. Care Coordination services are voluntary, and the patient may decline or stop services at any time by request to their care team member.   Care Coordination Consent Status: Patient agreed to services and verbal consent obtained.   Follow up plan:  Telephone appointment with care coordination team member scheduled for:  02/16/2023  Encounter Outcome:  Patient Scheduled from referral   Burman Nieves, [facility] Care Coordination Care Guide Direct Dial: [phone]

## 2023-02-10 ENCOUNTER — Other Ambulatory Visit (HOSPITAL_COMMUNITY): Payer: Self-pay | Admitting: Nurse Practitioner

## 2023-02-10 ENCOUNTER — Telehealth: Payer: Self-pay | Admitting: Nurse Practitioner

## 2023-02-10 DIAGNOSIS — I10 Essential (primary) hypertension: Secondary | ICD-10-CM

## 2023-02-10 MED ORDER — AMLODIPINE BESYLATE 10 MG PO TABS: 10.0000 mg | ORAL_TABLET | Freq: Every day | ORAL | 3 refills | Status: DC

## 2023-02-10 NOTE — Telephone Encounter (Signed)
Copied from CRM [id number]. Topic: Clinical - Medication Refill >> Feb 10, 2023  1:47 PM Larwance Sachs wrote: Most Recent Primary Care Visit:  Provider: Alden Hipp  Department: [facility]  Visit Type: PATIENT OUTREACH 30  Date: 02/02/2023  Medication: amLODipine (NORVASC) 10 MG tablet  Has the patient contacted their pharmacy? Yes, stated Doctors office needs to reach out to pharmacy  (Agent: If no, request that the patient contact the pharmacy for the refill. If patient does not wish to contact the pharmacy document the reason why and proceed with request.) (Agent: If yes, when and what did the pharmacy advise?)  Is this the correct pharmacy for this prescription? Yes If no, delete pharmacy and type the correct one.  This is the patient's preferred pharmacy:  [id number] [place], [place] - [address] AT [facility] [address] [place] [address] Phone: [phone] Fax: [phone]  [id number] [place], [place] - [address] AT [facility] [address] [place] [address] Phone: [phone] Fax: [phone]   Has the prescription been filled recently? No  Is the patient out of the medication? Yes, ran out 3 days ago  Has the patient been seen for an appointment in the last year OR does the patient have an upcoming appointment? Yes  Can we respond through MyChart? No, please call patient directly   Call back at [phone]  Agent: Please be advised that Rx refills may take up to 3 business days. We ask that you follow-up with your pharmacy.

## 2023-02-16 ENCOUNTER — Ambulatory Visit: Payer: Self-pay

## 2023-02-16 NOTE — Patient Instructions (Signed)
Visit Information  Thank you for taking time to visit with me today. Please don't hesitate to contact me if I can be of assistance to you.   Following are the goals we discussed today:  Patient will apply for Foodstamps. Patient will contact Child Support to request a reduction Patient to apply for income based housing Patient will use public transportation Patient will consider returning the car to dealership Patient will looking for another job or part time job   Our next appointment is by telephone on 02/25/23 at 11am  Please call the care guide team at [phone] if you need to cancel or reschedule your appointment.   If you are experiencing a Mental Health or Behavioral Health Crisis or need someone to talk to, please call 911  Patient verbalizes understanding of instructions and care plan provided today and agrees to view in MyChart. Active MyChart status and patient understanding of how to access instructions and care plan via MyChart confirmed with patient.     Telephone follow up appointment with care management team member scheduled for:02/25/23 at 11am.  Lysle Morales, BSW Social Worker  [phone]

## 2023-02-16 NOTE — Patient Outreach (Signed)
  Care Coordination   Initial Visit Note   02/16/2023 Name: Donald Wheeler MRN: [id number] DOB: 07/17/1970  Donald Wheeler is a 52 y.o. year old male who sees Ivonne Andrew, NP for primary care. I spoke with  [name] by phone today.  What matters to the patients health and wellness today?  Patient wants housing but he can not afford to pay rent and utilities.  Patient works, but pays $400-$500 in child support for his arrears.  He does not have much left for food or bills. Sw will provide a list of income based housing, food banks and DHHS foodstamps website to apply.  Patient will have family to assist with application.    Goals Addressed             This Visit's Progress    Care Coordination Activities       Interventions Today    Flowsheet Row Most Recent Value  Chronic Disease   Chronic disease during today's visit Diabetes, Hypertension (HTN), Chronic Kidney Disease/End Stage Renal Disease (ESRD)  General Interventions   General Interventions Discussed/Reviewed General Interventions Discussed, General Interventions Reviewed, [employer] is over budget. Pt agreed to contact child support for reduction, apply for foodstamps, look for another job or pt time job, consider returning his car, use the city bus and apply for income based housing. Pt stays with uncle and could be long term.]              SDOH assessments and interventions completed:  Yes  SDOH Interventions Today    Flowsheet Row Most Recent Value  SDOH Interventions   Food Insecurity Interventions Intervention Not Indicated, Other (Comment)  [Plans to apply for Food stamps, provide food bank list]  Housing Interventions Intervention Not Indicated, Other (Comment)  [Living with family until housing is secured]  Transportation Interventions Intervention Not Indicated, Other (Comment)  [Has car, advised to consider public transportation]  Utilities Interventions Intervention Not Indicated         Care Coordination Interventions:  Yes, provided   Follow up plan: Follow up call scheduled for 02/25/23 at 11am    Encounter Outcome:  Patient Visit Completed

## 2023-02-25 ENCOUNTER — Ambulatory Visit (INDEPENDENT_AMBULATORY_CARE_PROVIDER_SITE_OTHER): Payer: Self-pay | Admitting: Nurse Practitioner

## 2023-02-25 ENCOUNTER — Encounter: Payer: Self-pay | Admitting: Nurse Practitioner

## 2023-02-25 ENCOUNTER — Ambulatory Visit: Payer: Self-pay

## 2023-02-25 VITALS — BP 136/84 | HR 79 | Temp 98.4°F | Wt 180.4 lb

## 2023-02-25 DIAGNOSIS — E1165 Type 2 diabetes mellitus with hyperglycemia: Secondary | ICD-10-CM

## 2023-02-25 LAB — POCT GLYCOSYLATED HEMOGLOBIN (HGB A1C): Hemoglobin A1C: 6.7 % — AB (ref 4.0–5.6)

## 2023-02-25 MED ORDER — ASPIRIN 81 MG PO TBEC: 81.0000 mg | DELAYED_RELEASE_TABLET | Freq: Every day | ORAL | 3 refills | Status: DC

## 2023-02-25 MED ORDER — ATORVASTATIN CALCIUM 80 MG PO TABS: 80.0000 mg | ORAL_TABLET | Freq: Every day | ORAL | 3 refills | Status: DC

## 2023-02-25 NOTE — Patient Instructions (Signed)
Visit Information  Thank you for taking time to visit with me today. Please don't hesitate to contact me if I can be of assistance to you.   Following are the goals we discussed today:  Patient agreed to contact Child Support, apply for Foodstamps and contact food banks.   Our next appointment is by telephone on 03/02/23 at 3:30pm  Please call the care guide team at [phone] if you need to cancel or reschedule your appointment.   If you are experiencing a Mental Health or Behavioral Health Crisis or need someone to talk to, please call 911  Patient verbalizes understanding of instructions and care plan provided today and agrees to view in MyChart. Active MyChart status and patient understanding of how to access instructions and care plan via MyChart confirmed with patient.     Telephone follow up appointment with care management team member scheduled for:03/02/23 at 3:30pm  Lysle Morales, BSW Social Worker [phone]

## 2023-02-25 NOTE — Patient Outreach (Signed)
  Care Coordination   Follow Up Visit Note   02/25/2023 Name: Thienan Persons MRN: [id number] DOB: Feb 04, 1971  Vaughn Bickers is a 52 y.o. year old male who sees Ivonne Andrew, NP for primary care. I spoke with  [name] by phone today.  What matters to the patients health and wellness today?  Patient needs budgeting resources.    Goals Addressed             This Visit's Progress    Care Coordination Activities       Interventions Today    Flowsheet Row Most Recent Value  General Interventions   General Interventions Discussed/Reviewed General Interventions Discussed, General Interventions Reviewed, Doctor Visits  [Pt has not been able to follow up on resources as he was sick. Pt will contact child support today.]  Doctor Visits Discussed/Reviewed --  [Sw reminded patient of his medical appointment today.]              SDOH assessments and interventions completed:  No     Care Coordination Interventions:  Yes, provided   Follow up plan: Follow up call scheduled for 03/02/23 at 3:30pm    Encounter Outcome:  Patient Visit Completed

## 2023-02-25 NOTE — Progress Notes (Signed)
Subjective   Patient ID: Donald Wheeler, male    DOB: 1970-12-13, 52 y.o.   MRN: [id number]  Chief Complaint  Patient presents with   Diabetes    3 month follow up    Referring provider: Ivonne Andrew, NP  Donald Wheeler is a 52 y.o. male with Past Medical History: 09/07/2013: Bell's palsy 12/2017: Diabetes mellitus without complication (HCC)     Comment:  NEW ONSET  No date: HIV (human immunodeficiency virus infection) (HCC)     Comment:  dx'd 2000s No date: Hypertension 04/09/2017: Stroke [facility])     Comment:  vs TIA w/acute onset of right-sided weakness as well as               trouble speaking/notes 04/09/2017   HPI  Diabetes He presents for his follow-up diabetic visit. He has type 2 diabetes mellitus. No MedicAlert identification noted. His disease course has been stable. There are no hypoglycemic associated symptoms. There are no diabetic associated symptoms. There are no hypoglycemic complications. Symptoms are stable. Risk factors for coronary artery disease include diabetes mellitus, hypertension, male sex and sedentary lifestyle. He is compliant with treatment some of the time. He is following a diabetic diet. Meal planning includes avoidance of concentrated sweets. He has not had a previous visit with a dietitian. He rarely participates in exercise. A1C in office today is 6.7.  Hypertension This is a chronic problem. The problem is unchanged. The problem is controlled. There are no associated agents to hypertension. Risk factors for coronary artery disease include diabetes mellitus, male gender, sedentary lifestyle and family history. Compliance problems include medication cost.    Denies f/c/s, n/v/d, hemoptysis, PND, leg swelling Denies chest pain or edema   No Known Allergies  Immunization History  Administered Date(s) Administered   Hepatitis A 02/22/2012   Hepatitis A, Adult 02/20/2013   Hepatitis B 02/22/2012, 03/17/2012   Influenza Split 03/17/2012    Influenza Whole 03/17/2019   Influenza,inj,Quad PF,6+ Mos 02/20/2013, 01/03/2014, 11/07/2018, 01/26/2022   Meningococcal Mcv4o 05/11/2016   PFIZER(Purple Top)SARS-COV-2 Vaccination 05/29/2019, 06/20/2019, 01/05/2020   PNEUMOCOCCAL CONJUGATE-20 07/22/2021   Pneumococcal Polysaccharide-23 03/17/2012   Td 02/22/2012   Tdap 02/22/2012    Tobacco History: Social History   Tobacco Use  Smoking Status Some Days   Current packs/day: 0.00   Average packs/day: 0.2 packs/day for 15.0 years (3.0 ttl pk-yrs)   Types: Cigars, Cigarettes   Start date: 12/13/2002   Last attempt to quit: 12/12/2017   Years since quitting: 5.2  Smokeless Tobacco Never  Tobacco Comments   black and milds daily   Ready to quit: Yes Counseling given: Yes Tobacco comments: black and milds daily   Outpatient Encounter Medications as of 02/25/2023  Medication Sig   amLODipine (NORVASC) 10 MG tablet Take 1 tablet (10 mg total) by mouth daily.   bictegravir-emtricitabine-tenofovir AF (BIKTARVY) 50-200-25 MG TABS tablet Take 1 tablet by mouth daily.   hydrochlorothiazide (HYDRODIURIL) 12.5 MG tablet Take 1 tablet (12.5 mg total) by mouth daily.   Lancet Device MISC Use to check blood sugar twice daily. May substitute to any manufacturer covered by patient's insurance.   Lancets Misc. MISC May substitute to any manufacturer covered by patient's insurance.   losartan (COZAAR) 50 MG tablet Take 1 tablet (50 mg total) by mouth daily.   metFORMIN (GLUCOPHAGE) 1000 MG tablet Take 1 tablet (1,000 mg total) by mouth daily with breakfast.   sildenafil (VIAGRA) 25 MG tablet Take 1 tablet (25 mg total) by  mouth daily as needed for erectile dysfunction.   [DISCONTINUED] atorvastatin (LIPITOR) 80 MG tablet Take 1 tablet (80 mg total) by mouth daily.   aspirin EC (ASPIRIN LOW DOSE) 81 MG tablet Take 1 tablet (81 mg total) by mouth daily. Swallow whole.   atorvastatin (LIPITOR) 80 MG tablet Take 1 tablet (80 mg total) by mouth daily.    Blood Glucose Monitoring Suppl DEVI Use to check blood sugar twice daily. May substitute to any manufacturer covered by patient's insurance. (Patient not taking: Reported on 02/25/2023)   Glucose Blood (BLOOD GLUCOSE TEST STRIPS) STRP Use to check blood sugar twice daily. May substitute to any manufacturer covered by patient's insurance. (Patient not taking: Reported on 02/25/2023)   mirtazapine (REMERON) 15 MG tablet Take 1 tablet (15 mg total) by mouth at bedtime. (Patient not taking: Reported on 02/25/2023)   [DISCONTINUED] ASPIRIN LOW DOSE 81 MG tablet TAKE 1 TABLET(81 MG) BY MOUTH DAILY (Patient not taking: Reported on 02/25/2023)   No facility-administered encounter medications on file as of 02/25/2023.    Review of Systems  Review of Systems  Constitutional: Negative.   HENT: Negative.    Cardiovascular: Negative.   Gastrointestinal: Negative.   Allergic/Immunologic: Negative.   Neurological: Negative.   Psychiatric/Behavioral: Negative.       Objective:   BP 136/84   Pulse 79   Temp 98.4 F (36.9 C)   Wt 180 lb 6.4 oz (81.8 kg)   SpO2 99%   BMI 28.25 kg/m   Wt Readings from Last 5 Encounters:  02/25/23 180 lb 6.4 oz (81.8 kg)  11/26/22 184 lb (83.5 kg)  10/29/22 177 lb (80.3 kg)  08/26/22 182 lb 12.8 oz (82.9 kg)  07/30/22 176 lb (79.8 kg)     Physical Exam Vitals and nursing note reviewed.  Constitutional:      General: He is not in acute distress.    Appearance: He is well-developed.  Cardiovascular:     Rate and Rhythm: Normal rate and regular rhythm.  Pulmonary:     Effort: Pulmonary effort is normal.     Breath sounds: Normal breath sounds.  Skin:    General: Skin is warm and dry.  Neurological:     Mental Status: He is alert and oriented to person, place, and time.       Assessment & Plan:   Uncontrolled type 2 diabetes mellitus with hyperglycemia (HCC) -     POCT glycosylated hemoglobin (Hb A1C) -     CBC -     Comprehensive metabolic  panel  Other orders -     Aspirin; Take 1 tablet (81 mg total) by mouth daily. Swallow whole.  Dispense: 100 tablet; Refill: 3 -     Atorvastatin Calcium; Take 1 tablet (80 mg total) by mouth daily.  Dispense: 90 tablet; Refill: 3     Return in about 3 months (around 05/26/2023).   Ivonne Andrew, NP 02/25/2023

## 2023-02-25 NOTE — Patient Instructions (Signed)
1. Uncontrolled type 2 diabetes mellitus with hyperglycemia (HCC) (Primary)  - POCT glycosylated hemoglobin (Hb A1C) - CBC - Comprehensive metabolic panel   Follow up:  Follow up in 3 months

## 2023-02-26 ENCOUNTER — Other Ambulatory Visit: Payer: Self-pay | Admitting: Nurse Practitioner

## 2023-02-26 DIAGNOSIS — N289 Disorder of kidney and ureter, unspecified: Secondary | ICD-10-CM

## 2023-02-26 LAB — COMPREHENSIVE METABOLIC PANEL
ALT: 16 [IU]/L (ref 0–44)
AST: 17 [IU]/L (ref 0–40)
Albumin: 4.2 g/dL (ref 3.8–4.9)
Alkaline Phosphatase: 85 [IU]/L (ref 44–121)
BUN/Creatinine Ratio: 12 (ref 9–20)
BUN: 20 mg/dL (ref 6–24)
Bilirubin Total: 0.4 mg/dL (ref 0.0–1.2)
CO2: 27 mmol/L (ref 20–29)
Calcium: 9.1 mg/dL (ref 8.7–10.2)
Chloride: 99 mmol/L (ref 96–106)
Creatinine, Ser: 1.72 mg/dL — ABNORMAL HIGH (ref 0.76–1.27)
Globulin, Total: 2.9 g/dL (ref 1.5–4.5)
Glucose: 187 mg/dL — ABNORMAL HIGH (ref 70–99)
Potassium: 4.5 mmol/L (ref 3.5–5.2)
Sodium: 140 mmol/L (ref 134–144)
Total Protein: 7.1 g/dL (ref 6.0–8.5)
eGFR: 47 mL/min/{1.73_m2} — ABNORMAL LOW (ref >59)

## 2023-02-26 LAB — CBC
Hematocrit: 45.2 % (ref 37.5–51.0)
Hemoglobin: 14.9 g/dL (ref 13.0–17.7)
MCH: 29.3 pg (ref 26.6–33.0)
MCHC: 33 g/dL (ref 31.5–35.7)
MCV: 89 fL (ref 79–97)
Platelets: 229 10*3/uL (ref 150–450)
RBC: 5.08 x10E6/uL (ref 4.14–5.80)
RDW: 14.1 % (ref 11.6–15.4)
WBC: 7 10*3/uL (ref 3.4–10.8)

## 2023-03-11 ENCOUNTER — Ambulatory Visit: Payer: Self-pay

## 2023-03-11 NOTE — Patient Instructions (Signed)
Visit Information  Thank you for taking time to visit with me today. Please don't hesitate to contact me if I can be of assistance to you.   Following are the goals we discussed today:  Patient will follow up with Child Support if no reply and apply for foodstamps.   Our next appointment is by telephone on 03/25/23 at 1pm  Please call the care guide team at [phone] if you need to cancel or reschedule your appointment.   If you are experiencing a Mental Health or Behavioral Health Crisis or need someone to talk to, please call 911  Patient verbalizes understanding of instructions and care plan provided today and agrees to view in MyChart. Active MyChart status and patient understanding of how to access instructions and care plan via MyChart confirmed with patient.     Telephone follow up appointment with care management team member scheduled for:03/25/23 at 1pm.   Lysle Morales, BSW Social Worker [phone]

## 2023-03-11 NOTE — Patient Outreach (Signed)
  Care Coordination   Follow Up Visit Note   03/11/2023 Name: Kym Drawdy MRN: [id number] DOB: May 12, 1970  Kevaughn Cutbirth is a 52 y.o. year old male who sees Ivonne Andrew, NP for primary care. I spoke with  [name] by phone today.  What matters to the patients health and wellness today?  Patient needs child support adjustment and foodstamps.    Goals Addressed             This Visit's Progress    Care Coordination Activities       Interventions Today    Flowsheet Row Most Recent Value  General Interventions   General Interventions Discussed/Reviewed General Interventions Discussed, General Interventions Reviewed  [Pt was not able to connect with child support and left a message. Pt is doing ok with food and did not use the food banks. Pt has not applied for food stamps but plans to soon. Pt lives w/family and SW refers to [facility] for energy assistance.]              SDOH assessments and interventions completed:  No     Care Coordination Interventions:  Yes, provided   Follow up plan: Follow up call scheduled for 03/25/23 at 1pm.    Encounter Outcome:  Patient Visit Completed

## 2023-03-22 ENCOUNTER — Other Ambulatory Visit: Payer: Self-pay

## 2023-03-22 ENCOUNTER — Other Ambulatory Visit: Payer: Self-pay | Admitting: Pharmacist

## 2023-03-22 ENCOUNTER — Other Ambulatory Visit (HOSPITAL_COMMUNITY): Payer: Self-pay

## 2023-03-22 DIAGNOSIS — Z21 Asymptomatic human immunodeficiency virus [HIV] infection status: Secondary | ICD-10-CM

## 2023-03-22 MED ORDER — BIKTARVY 50-200-25 MG PO TABS
1.0000 | ORAL_TABLET | Freq: Every day | ORAL | 0 refills | Status: DC
  Filled 2023-03-22: qty 30, 30d supply, fill #0

## 2023-03-22 NOTE — Progress Notes (Signed)
 Specialty Pharmacy Initiation Note   Donald Wheeler is a 53 y.o. male who will be followed by the specialty pharmacy service for RxSp HIV    Review of administration, indication, effectiveness, safety, potential side effects, storage/disposable, and missed dose instructions occurred today for patient's specialty medication(s) Bictegravir-Emtricitab-Tenofov (Biktarvy )     Patient/Caregiver did not have any additional questions or concerns.   Patient's therapy is appropriate to: Continue    Goals Addressed             This Visit's Progress    Achieve Undetectable HIV Viral Load < 20       Patient is on track. Patient will work on increased adherence      Increase CD4 count until steady state       Patient is on track. Patient will work on increased adherence      Maintain optimal adherence to therapy       Patient is not on track and no change. Patient will work on increased adherence         Alan JINNY Geralds Specialty Pharmacist

## 2023-03-22 NOTE — Progress Notes (Signed)
 Specialty Pharmacy Initial Fill Coordination Note  Donald Wheeler is a 52 y.o. male contacted today regarding initial fill of specialty medication(s) Bictegravir-Emtricitab-Tenofov (Biktarvy )   Patient requested [name] at [facility] [place] date: 03/22/23   Medication will be filled on 03/22/23.   Patient is aware of $0 copayment.

## 2023-03-23 ENCOUNTER — Other Ambulatory Visit (HOSPITAL_COMMUNITY): Payer: Self-pay

## 2023-03-25 ENCOUNTER — Ambulatory Visit: Payer: Self-pay

## 2023-03-25 NOTE — Patient Instructions (Signed)
 Visit Information  Thank you for taking time to visit with me today. Please don't hesitate to contact me if I can be of assistance to you.   Following are the goals we discussed today:  Patient will file with the court house for child support reduction.   If you are experiencing a Mental Health or Behavioral Health Crisis or need someone to talk to, please call 911  Patient verbalizes understanding of instructions and care plan provided today and agrees to view in MyChart. Active MyChart status and patient understanding of how to access instructions and care plan via MyChart confirmed with patient.     No further follow up required: Patient does not request a follow up visit.  Tillman Gardener, BSW Social Worker [phone]

## 2023-03-25 NOTE — Patient Outreach (Signed)
  Care Coordination   Follow Up Visit Note   03/25/2023 Name: Donald Wheeler MRN: [id number] DOB: 1970-11-01  Donald Wheeler is a 53 y.o. year old male who sees Oley Bascom RAMAN, NP for primary care. I spoke with  Donald Wheeler by phone today.  What matters to the patients health and wellness today?  Patient need to adjust child support.  Pt plans to file next week.  No other unmet need.    Goals Addressed             This Visit's Progress    Care Coordination Activities       Interventions Today    Flowsheet Row Most Recent Value  General Interventions   General Interventions Discussed/Reviewed General Interventions Discussed, General Interventions Reviewed  [Pt has communicated with Child Support and he has to pay $20 per child to file for decreased payment. Pt plans to go to the court house next week to file.Family continues to help with food. Pt can't get foodstamps because he owes a balance.]              SDOH assessments and interventions completed:  No     Care Coordination Interventions:  Yes, provided   Follow up plan: No further intervention required.   Encounter Outcome:  Patient Visit Completed

## 2023-03-26 ENCOUNTER — Other Ambulatory Visit (HOSPITAL_COMMUNITY): Payer: Self-pay

## 2023-03-30 ENCOUNTER — Telehealth: Payer: Self-pay | Admitting: Pharmacist

## 2023-03-30 ENCOUNTER — Other Ambulatory Visit: Payer: Self-pay

## 2023-03-30 NOTE — Progress Notes (Signed)
 Attempted to contact patient for scheduled appointment for medication management. Left HIPAA compliant message for patient to return my call at their convenience.   Will collaborate with ID/PCP to discuss if patient would like medications sent to [facility] for mail order, now that he is on employee insurance.   Catie [facility], PharmD, BCACP, CPP Clinical Pharmacist [facility] [phone]

## 2023-03-30 NOTE — Progress Notes (Signed)
 Patient returned my call, left me a voicemail.   Called back and left another voicemail.

## 2023-03-31 ENCOUNTER — Telehealth: Payer: Self-pay

## 2023-03-31 ENCOUNTER — Ambulatory Visit: Payer: Self-pay | Admitting: Internal Medicine

## 2023-03-31 NOTE — Telephone Encounter (Signed)
 Attempted to call and was unable to reach or leave a message. I have sent mychart message reminding patient to call the office and reschedule NO SHOW from 03/31/2023 with Dr Levern Reader.

## 2023-04-08 ENCOUNTER — Other Ambulatory Visit: Payer: Self-pay

## 2023-04-08 ENCOUNTER — Encounter: Payer: Self-pay | Admitting: Internal Medicine

## 2023-04-08 ENCOUNTER — Ambulatory Visit (INDEPENDENT_AMBULATORY_CARE_PROVIDER_SITE_OTHER): Payer: Commercial Managed Care - PPO | Admitting: Internal Medicine

## 2023-04-08 VITALS — BP 132/86 | HR 71 | Temp 97.7°F | Resp 16 | Wt 183.4 lb

## 2023-04-08 DIAGNOSIS — F321 Major depressive disorder, single episode, moderate: Secondary | ICD-10-CM

## 2023-04-08 DIAGNOSIS — B20 Human immunodeficiency virus [HIV] disease: Secondary | ICD-10-CM | POA: Diagnosis not present

## 2023-04-08 DIAGNOSIS — Z21 Asymptomatic human immunodeficiency virus [HIV] infection status: Secondary | ICD-10-CM | POA: Diagnosis not present

## 2023-04-08 DIAGNOSIS — N183 Chronic kidney disease, stage 3 unspecified: Secondary | ICD-10-CM

## 2023-04-08 MED ORDER — BIKTARVY 50-200-25 MG PO TABS: 1.0000 | ORAL_TABLET | Freq: Every day | ORAL | 11 refills | Status: DC

## 2023-04-08 MED ORDER — FLUOXETINE HCL 10 MG PO CAPS: 10.0000 mg | ORAL_CAPSULE | Freq: Every day | ORAL | 3 refills | Status: DC

## 2023-04-08 NOTE — Progress Notes (Signed)
RFV: follow up for hiv disease  Patient ID: Donald Wheeler, male   DOB: December 19, 1970, 52 y.o.   MRN: [id number]  HPI The patient, diagnosed with HIV disease, HTN, CAD, T2DM who reports a recent lapse in their antiretroviral therapy (Biktarvy) due to a refill issue, resulting in a missed dosage for approximately one to two weeks. They have since resumed the medication for the past two to three weeks. Now taking it daily  In addition to their HIV management, the patient expresses significant emotional distress, describing feelings of depression and frequent crying episodes. They attribute this emotional state to concerns about their HIV status and financial difficulties. They report a history of suicidal ideation, although they deny recent thoughts of self-harm.  The patient also mentions fluctuations in their weight, but denies recent illness or significant changes in appetite. They express reluctance towards the idea of antidepressant therapy due to concerns about polypharmacy, but are open to the possibility of speaking with a therapist.  Lastly, the patient reports occasional difficulty in accessing their medications, which contributes to their overall stress.  Outpatient Encounter Medications as of 04/08/2023  Medication Sig   amLODipine (NORVASC) 10 MG tablet Take 1 tablet (10 mg total) by mouth daily.   aspirin EC (ASPIRIN LOW DOSE) 81 MG tablet Take 1 tablet (81 mg total) by mouth daily. Swallow whole.   atorvastatin (LIPITOR) 80 MG tablet Take 1 tablet (80 mg total) by mouth daily.   bictegravir-emtricitabine-tenofovir AF (BIKTARVY) 50-200-25 MG TABS tablet Take 1 tablet by mouth daily.   hydrochlorothiazide (HYDRODIURIL) 12.5 MG tablet Take 1 tablet (12.5 mg total) by mouth daily.   Lancet Device MISC Use to check blood sugar twice daily. May substitute to any manufacturer covered by patient's insurance.   Lancets Misc. MISC May substitute to any manufacturer covered by patient's  insurance.   losartan (COZAAR) 50 MG tablet Take 1 tablet (50 mg total) by mouth daily.   metFORMIN (GLUCOPHAGE) 1000 MG tablet Take 1 tablet (1,000 mg total) by mouth daily with breakfast.   mirtazapine (REMERON) 15 MG tablet Take 1 tablet (15 mg total) by mouth at bedtime.   sildenafil (VIAGRA) 25 MG tablet Take 1 tablet (25 mg total) by mouth daily as needed for erectile dysfunction.   Blood Glucose Monitoring Suppl DEVI Use to check blood sugar twice daily. May substitute to any manufacturer covered by patient's insurance. (Patient not taking: Reported on 04/08/2023)   Glucose Blood (BLOOD GLUCOSE TEST STRIPS) STRP Use to check blood sugar twice daily. May substitute to any manufacturer covered by patient's insurance. (Patient not taking: Reported on 04/08/2023)   No facility-administered encounter medications on file as of 04/08/2023.     Patient Active Problem List   Diagnosis Date Noted   Uncontrolled type 2 diabetes mellitus with hyperglycemia (HCC) 08/04/2021   Healthcare maintenance 02/19/2021   PSA elevation 02/05/2021   Persistent microscopic hematuria 02/05/2021   Controlled type 2 diabetes mellitus with microalbuminuria, with long-term current use of insulin (HCC) 08/09/2019   Vitamin D deficiency 05/10/2019   Shingles 07/2018   Hyperosmolar non-ketotic state in patient with type 2 diabetes mellitus (HCC) 12/23/2017   Diabetes mellitus, new onset (HCC) 12/23/2017   History of hematuria 08/16/2017   Mixed hyperlipidemia 06/16/2017   Cerebral thrombosis with cerebral infarction 04/10/2017   Solitary pulmonary nodule 04/10/2017   Focal neurological deficit 04/09/2017   CKD (chronic kidney disease) stage 3, GFR 30-59 ml/min 04/09/2017   Smoker 04/09/2017   HTN (hypertension), benign 09/07/2013  HIV (human immunodeficiency virus infection) (HCC) 03/17/2012     Health Maintenance Due  Topic Date Due   Zoster Vaccines- Shingrix (1 of 2) Never done   Fecal DNA (Cologuard)   Never done   OPHTHALMOLOGY EXAM  08/27/2021   FOOT EXAM  02/03/2022   DTaP/Tdap/Td (3 - Td or Tdap) 02/21/2022   COVID-19 Vaccine (4 - 2024-25 season) 11/15/2022     Review of Systems 12 point ros is negative except what is mentioned above Physical Exam   BP 132/86   Pulse 71   Temp 97.7 F (36.5 C) (Temporal)   Resp 16   Wt 183 lb 6.4 oz (83.2 kg)   SpO2 98%   BMI 28.72 kg/m   Physical Exam  Constitutional: He is oriented to person, place, and time. He appears well-developed and well-nourished. No distress.  HENT:  Mouth/Throat: Oropharynx is clear and moist. No oropharyngeal exudate.  Cardiovascular: Normal rate, regular rhythm and normal heart sounds. Exam reveals no gallop and no friction rub.  No murmur heard.  Pulmonary/Chest: Effort normal and breath sounds normal. No respiratory distress. He has no wheezes.  Lymphadenopathy:  He has no cervical adenopathy.  Neurological: He is alert and oriented to person, place, and time.  Skin: Skin is warm and dry. No rash noted. No erythema.  Psychiatric: He has a normal mood and affect. His behavior is normal.   Lab Results  Component Value Date   CD4TCELL 27 (L) 10/29/2022   Lab Results  Component Value Date   CD4TABS 612 10/29/2022   CD4TABS 456 07/30/2022   CD4TABS 488 07/22/2021   Lab Results  Component Value Date   HIV1RNAQUANT <20 (H) 10/29/2022   Lab Results  Component Value Date   HEPBSAB NONREACTIVE 03/03/2012   Lab Results  Component Value Date   LABRPR NON-REACTIVE 10/29/2022    CBC Lab Results  Component Value Date   WBC 7.0 02/25/2023   RBC 5.08 02/25/2023   HGB 14.9 02/25/2023   HCT 45.2 02/25/2023   PLT 229 02/25/2023   MCV 89 02/25/2023   MCH 29.3 02/25/2023   MCHC 33.0 02/25/2023   RDW 14.1 02/25/2023   LYMPHSABS 2,147 07/30/2022   MONOABS 0.6 01/06/2021   EOSABS 128 07/30/2022    BMET Lab Results  Component Value Date   NA 140 02/25/2023   K 4.5 02/25/2023   CL 99 02/25/2023    CO2 27 02/25/2023   GLUCOSE 187 (H) 02/25/2023   BUN 20 02/25/2023   CREATININE 1.72 (H) 02/25/2023   CALCIUM 9.1 02/25/2023   GFRNONAA >60 01/06/2021   GFRAA 61 07/15/2020      Assessment and Plan  HIV Infection HIV infection managed with Biktarvy. Missed doses for 1-2 weeks due to refill issue, resumed medication for 2-3 weeks. Last viral load check in August; December labs available. Emphasized adherence for long-term health, citing examples of individuals living into their eighties with well-controlled HIV. - Recheck viral load and CD4 count today - Refill Biktarvy  Long term medication management/CKD3  - will check cr to see that it is still stable  Depression Moderate depression with symptoms of crying, low mood, and past thoughts of self-harm. Financial and health-related stressors contributing. Discussed benefits of antidepressants and therapy. Patient open to low-dose medication and therapy. - Start low-dose fluoxetine 10mg  daily - Refer to therapist - Follow-up in 4-6 weeks to assess mood and treatment effectiveness  General Health Maintenance Discussed flu vaccine, declined. No recent illnesses reported. - Perform  blood work today  Follow-up - Schedule follow-up appointment in 4-6 weeks.

## 2023-04-09 ENCOUNTER — Other Ambulatory Visit (HOSPITAL_COMMUNITY): Payer: Self-pay

## 2023-04-09 ENCOUNTER — Other Ambulatory Visit: Payer: Self-pay | Admitting: Internal Medicine

## 2023-04-09 ENCOUNTER — Other Ambulatory Visit: Payer: Self-pay

## 2023-04-09 DIAGNOSIS — Z21 Asymptomatic human immunodeficiency virus [HIV] infection status: Secondary | ICD-10-CM

## 2023-04-09 LAB — T-HELPER CELL (CD4) - (RCID CLINIC ONLY)
CD4 % Helper T Cell: 24 % — ABNORMAL LOW (ref 33–65)
CD4 T Cell Abs: 574 /uL (ref 400–1790)

## 2023-04-09 MED ORDER — BIKTARVY 50-200-25 MG PO TABS
1.0000 | ORAL_TABLET | Freq: Every day | ORAL | 11 refills | Status: DC
  Filled 2023-04-09: qty 30, 30d supply, fill #0

## 2023-04-09 NOTE — Progress Notes (Signed)
Specialty Pharmacy Refill Coordination Note  Donald Wheeler is a 53 y.o. male contacted today regarding refills of specialty medication(s) Bictegravir-Emtricitab-Tenofov Musician)   Patient requested [name] at [facility] at [place] date: 04/19/23   Medication will be filled on 04/19/23 pending a refill request.

## 2023-04-12 ENCOUNTER — Other Ambulatory Visit: Payer: Self-pay

## 2023-04-12 ENCOUNTER — Other Ambulatory Visit (HOSPITAL_COMMUNITY): Payer: Self-pay

## 2023-04-12 LAB — BASIC METABOLIC PANEL
BUN/Creatinine Ratio: 10 (calc) (ref 6–22)
BUN: 17 mg/dL (ref 7–25)
CO2: 27 mmol/L (ref 20–32)
Calcium: 9.3 mg/dL (ref 8.6–10.3)
Chloride: 105 mmol/L (ref 98–110)
Creat: 1.66 mg/dL — ABNORMAL HIGH (ref 0.70–1.30)
Glucose, Bld: 142 mg/dL — ABNORMAL HIGH (ref 65–99)
Potassium: 4.2 mmol/L (ref 3.5–5.3)
Sodium: 142 mmol/L (ref 135–146)

## 2023-04-12 LAB — HIV-1 RNA QUANT-NO REFLEX-BLD
HIV 1 RNA Quant: <20 {copies}/mL — ABNORMAL HIGH
HIV-1 RNA Quant, Log: <1.3 {Log} — ABNORMAL HIGH

## 2023-04-12 LAB — RPR: RPR Ser Ql: NONREACTIVE

## 2023-04-12 LAB — MICROALBUMIN / CREATININE URINE RATIO
Creatinine, Urine: 125 mg/dL (ref 20–320)
Microalb Creat Ratio: 541 mg/g{creat} — ABNORMAL HIGH (ref <30)
Microalb, Ur: 67.6 mg/dL

## 2023-04-13 ENCOUNTER — Ambulatory Visit: Payer: Commercial Managed Care - PPO | Admitting: Licensed Clinical Social Worker

## 2023-04-16 ENCOUNTER — Other Ambulatory Visit: Payer: Self-pay

## 2023-04-19 ENCOUNTER — Other Ambulatory Visit (HOSPITAL_COMMUNITY): Payer: Self-pay

## 2023-04-20 ENCOUNTER — Other Ambulatory Visit (HOSPITAL_COMMUNITY): Payer: Self-pay

## 2023-04-20 MED ORDER — ATORVASTATIN CALCIUM 80 MG PO TABS
80.0000 mg | ORAL_TABLET | Freq: Every day | ORAL | 3 refills | Status: DC
  Filled 2023-04-20: qty 90, 90d supply, fill #0
  Filled 2023-07-29: qty 90, 90d supply, fill #1
  Filled 2023-10-27: qty 90, 90d supply, fill #2
  Filled 2023-11-26: qty 60, 60d supply, fill #3

## 2023-04-20 MED ORDER — LOSARTAN POTASSIUM 50 MG PO TABS
50.0000 mg | ORAL_TABLET | Freq: Every day | ORAL | 11 refills | Status: DC
  Filled 2023-04-20: qty 30, 30d supply, fill #0

## 2023-04-21 ENCOUNTER — Other Ambulatory Visit (HOSPITAL_COMMUNITY): Payer: Self-pay

## 2023-04-23 ENCOUNTER — Other Ambulatory Visit: Payer: Self-pay

## 2023-05-06 ENCOUNTER — Other Ambulatory Visit: Payer: Self-pay

## 2023-05-07 ENCOUNTER — Other Ambulatory Visit: Payer: Self-pay

## 2023-05-07 ENCOUNTER — Other Ambulatory Visit (HOSPITAL_COMMUNITY): Payer: Self-pay

## 2023-05-07 ENCOUNTER — Encounter (HOSPITAL_COMMUNITY): Payer: Self-pay | Admitting: Emergency Medicine

## 2023-05-07 ENCOUNTER — Emergency Department (HOSPITAL_COMMUNITY)
Admission: EM | Admit: 2023-05-07 | Discharge: 2023-05-07 | Disposition: A | Discharge status: Home / Self Care | Payer: Commercial Managed Care - PPO | Attending: Emergency Medicine | Admitting: Emergency Medicine

## 2023-05-07 ENCOUNTER — Emergency Department (HOSPITAL_COMMUNITY): Payer: Commercial Managed Care - PPO

## 2023-05-07 DIAGNOSIS — N189 Chronic kidney disease, unspecified: Secondary | ICD-10-CM | POA: Insufficient documentation

## 2023-05-07 DIAGNOSIS — Z21 Asymptomatic human immunodeficiency virus [HIV] infection status: Secondary | ICD-10-CM | POA: Diagnosis not present

## 2023-05-07 DIAGNOSIS — Z7982 Long term (current) use of aspirin: Secondary | ICD-10-CM | POA: Insufficient documentation

## 2023-05-07 DIAGNOSIS — S80911A Unspecified superficial injury of right knee, initial encounter: Secondary | ICD-10-CM | POA: Diagnosis not present

## 2023-05-07 DIAGNOSIS — E119 Type 2 diabetes mellitus without complications: Secondary | ICD-10-CM | POA: Insufficient documentation

## 2023-05-07 DIAGNOSIS — M25561 Pain in right knee: Secondary | ICD-10-CM | POA: Diagnosis not present

## 2023-05-07 DIAGNOSIS — Z7984 Long term (current) use of oral hypoglycemic drugs: Secondary | ICD-10-CM | POA: Diagnosis not present

## 2023-05-07 MED ORDER — LIDOCAINE 5 % EX PTCH
1.0000 | MEDICATED_PATCH | CUTANEOUS | Status: DC
  Administered 2023-05-07: 1 via TRANSDERMAL
  Filled 2023-05-07: qty 1

## 2023-05-07 MED ORDER — ACETAMINOPHEN 325 MG PO TABS
650.0000 mg | ORAL_TABLET | Freq: Once | ORAL | Status: AC
  Administered 2023-05-07: 650 mg via ORAL
  Filled 2023-05-07: qty 2

## 2023-05-07 MED ORDER — LIDOCAINE 5 % EX PTCH: 1.0000 | MEDICATED_PATCH | CUTANEOUS | 0 refills | Status: DC

## 2023-05-07 MED ORDER — LIDOCAINE 5 % EX PTCH
1.0000 | MEDICATED_PATCH | CUTANEOUS | 0 refills | Status: AC
  Filled 2023-05-07: qty 30, 30d supply, fill #0

## 2023-05-07 NOTE — ED Triage Notes (Signed)
Patient complaining of right knee pain since yesterday. Denies recent falls/injuries. Ambulatory to triage.

## 2023-05-07 NOTE — ED Provider Notes (Signed)
 [place] EMERGENCY DEPARTMENT AT [facility] Provider Note   CSN: [id number] Arrival date & time: 05/07/23  0559     History  Chief Complaint  Patient presents with   Knee Pain    Donald Wheeler is a 53 y.o. male past medical history significant for HIV, CKD, CVA, and diabetes presents today for right knee pain that began yesterday.  Patient denies any recent falls/injuries.  Patient denies fever, chills, swelling, or inability to bear weight.   Knee Pain      Home Medications Prior to Admission medications   Medication Sig Start Date End Date Taking? Authorizing Provider  lidocaine (LIDODERM) 5 % Place 1 patch onto the skin daily. Remove & Discard patch within 12 hours or as directed by MD 05/07/23  Yes Dolphus Jenny, PA-C  amLODipine (NORVASC) 10 MG tablet Take 1 tablet (10 mg total) by mouth daily. 02/10/23   Donell Beers, FNP  aspirin EC (ASPIRIN LOW DOSE) 81 MG tablet Take 1 tablet (81 mg total) by mouth daily. Swallow whole. 02/25/23   Ivonne Andrew, NP  atorvastatin (LIPITOR) 80 MG tablet Take 1 tablet (80 mg total) by mouth daily. 02/25/23   Ivonne Andrew, NP  atorvastatin (LIPITOR) 80 MG tablet Take 1 tablet (80 mg total) by mouth daily. 02/25/23   Ivonne Andrew, NP  bictegravir-emtricitabine-tenofovir AF (BIKTARVY) 50-200-25 MG TABS tablet Take 1 tablet by mouth daily. 04/09/23   Judyann Munson, MD  Blood Glucose Monitoring Suppl DEVI Use to check blood sugar twice daily. May substitute to any manufacturer covered by patient's insurance. Patient not taking: Reported on 04/08/2023 02/02/23   Ivonne Andrew, NP  FLUoxetine (PROZAC) 10 MG capsule Take 1 capsule (10 mg total) by mouth daily. 04/08/23   Judyann Munson, MD  Glucose Blood (BLOOD GLUCOSE TEST STRIPS) STRP Use to check blood sugar twice daily. May substitute to any manufacturer covered by patient's insurance. Patient not taking: Reported on 04/08/2023 02/02/23   Ivonne Andrew, NP   hydrochlorothiazide (HYDRODIURIL) 12.5 MG tablet Take 1 tablet (12.5 mg total) by mouth daily. 02/02/23   Ivonne Andrew, NP  Lancet Device MISC Use to check blood sugar twice daily. May substitute to any manufacturer covered by patient's insurance. 02/02/23   Ivonne Andrew, NP  Lancets Misc. MISC May substitute to any manufacturer covered by patient's insurance. 02/02/23   Ivonne Andrew, NP  losartan (COZAAR) 50 MG tablet Take 1 tablet (50 mg total) by mouth daily. 02/02/23   Ivonne Andrew, NP  losartan (COZAAR) 50 MG tablet Take 1 tablet (50 mg total) by mouth daily. 02/06/23   Judyann Munson, MD  metFORMIN (GLUCOPHAGE) 1000 MG tablet Take 1 tablet (1,000 mg total) by mouth daily with breakfast. 02/02/23   Ivonne Andrew, NP  mirtazapine (REMERON) 15 MG tablet Take 1 tablet (15 mg total) by mouth at bedtime. 02/02/23   Ivonne Andrew, NP  sildenafil (VIAGRA) 25 MG tablet Take 1 tablet (25 mg total) by mouth daily as needed for erectile dysfunction. 02/02/23   Ivonne Andrew, NP      Allergies    Patient has no known allergies.    Review of Systems   Review of Systems  Musculoskeletal:  Positive for arthralgias.    Physical Exam Updated Vital Signs BP (!) 143/98 (BP Location: Right Arm)   Pulse 97   Temp 97.9 F (36.6 C)   Resp 18   Ht 5\' 7"  (1.702  m)   Wt 81.6 kg   SpO2 93%   BMI 28.19 kg/m  Physical Exam Vitals and nursing note reviewed.  Constitutional:      General: He is not in acute distress.    Appearance: Normal appearance. He is well-developed. He is not ill-appearing, toxic-appearing or diaphoretic.  HENT:     Head: Normocephalic and atraumatic.     Mouth/Throat:     Mouth: Mucous membranes are moist.  Eyes:     Extraocular Movements: Extraocular movements intact.     Conjunctiva/sclera: Conjunctivae normal.  Cardiovascular:     Rate and Rhythm: Normal rate.     Pulses: Normal pulses.  Pulmonary:     Effort: Pulmonary effort is normal.      Breath sounds: Normal breath sounds.  Abdominal:     Palpations: Abdomen is soft.  Musculoskeletal:        General: No swelling or deformity.     Cervical back: Neck supple.     Right lower leg: No edema.     Left lower leg: No edema.     Comments: Patient has no signs of effusion, swelling, deformity, warmth, or erythema to bilateral knees.  Patient does have tenderness to palpation of the right patella.  Patient has no pain with lateral, medial, anterior, or posterior stress to the right knee.  Skin:    General: Skin is warm and dry.     Capillary Refill: Capillary refill takes less than 2 seconds.  Neurological:     Mental Status: He is alert and oriented to person, place, and time.  Psychiatric:        Mood and Affect: Mood normal.     ED Results / Procedures / Treatments   Labs (all labs ordered are listed, but only abnormal results are displayed) Labs Reviewed - No data to display  EKG None  Radiology DG Knee Complete 4 Views Right Result Date: 05/07/2023 CLINICAL DATA:  Knee pain without injury. EXAM: RIGHT KNEE - COMPLETE 4+ VIEW COMPARISON:  None Available. FINDINGS: No evidence of fracture, dislocation, or joint effusion. No evidence of arthropathy or other focal bone abnormality. Soft tissues are unremarkable aside from scattered calcific plaques in the superficial femoral artery and the popliteal trifurcation arteries. IMPRESSION: No acute radiographic findings. Atherosclerosis. Electronically Signed   By: Almira Bar M.D.   On: 05/07/2023 06:59    Procedures Procedures    Medications Ordered in ED Medications  lidocaine (LIDODERM) 5 % 1 patch (has no administration in time range)  acetaminophen (TYLENOL) tablet 650 mg (has no administration in time range)    ED Course/ Medical Decision Making/ A&P                                 Medical Decision Making  This patient presents to the ED with chief complaint(s) of right knee pain with pertinent past  medical history of diabetes, CKD, HIV which further complicates the presenting complaint. The complaint involves an extensive differential diagnosis and also carries with it a high risk of complications and morbidity.    The differential diagnosis includes gout, septic joint, musculoskeletal pain, bone fracture, ligament injury  Additional history obtained: Records reviewed infectious disease office visit  ED Course and Reassessment:   Independent visualization of imaging: - I independently visualized the following imaging with scope of interpretation limited to determining acute life threatening conditions related to emergency care: Right knee x-ray, which  revealed no acute radiographic findings  Consultation: - Consulted or discussed management/test interpretation w/ external professional: None  Consideration for admission or further workup: Considered for mission further compared patient's vital signs, physical exam, and imaging were reassuring.  Patient has no signs or symptoms concerning for septic joint.  Patient's symptoms likely due to musculoskeletal pain.  Patient given Lidoderm patches and advised to take Tylenol as needed for pain.  Patient should follow-up with his primary care for further evaluation and treatment of his symptoms persist.        Final Clinical Impression(s) / ED Diagnoses Final diagnoses:  Acute pain of right knee    Rx / DC Orders ED Discharge Orders          Ordered    lidocaine (LIDODERM) 5 %  Every 24 hours        05/07/23 0707              Dolphus Jenny, PA-C 05/07/23 1610    Maia Plan, MD 05/08/23 [phone]

## 2023-05-07 NOTE — Discharge Instructions (Addendum)
Today you were seen for right knee pain.  Please use the knee brace as needed for support.  Pick up your Lidoderm patches and take Tylenol (650mg  every 6 hours) as needed for pain.  If your symptoms persist please follow-up with your primary care for further evaluation and treatment.  Thank you for letting us treat you today. After performing a physical exam and reviewing your imaging, I feel you are safe to go home. Please follow up with your PCP in the next several days and provide them with your records from this visit. Return to the Emergency Room if pain becomes severe or symptoms worsen.

## 2023-05-08 ENCOUNTER — Encounter (HOSPITAL_COMMUNITY): Payer: Self-pay | Admitting: Emergency Medicine

## 2023-05-08 ENCOUNTER — Other Ambulatory Visit: Payer: Self-pay

## 2023-05-08 ENCOUNTER — Emergency Department (HOSPITAL_COMMUNITY)
Admission: EM | Admit: 2023-05-08 | Discharge: 2023-05-09 | Disposition: A | Discharge status: Home / Self Care | Payer: Commercial Managed Care - PPO | Attending: Emergency Medicine | Admitting: Emergency Medicine

## 2023-05-08 DIAGNOSIS — Z6828 Body mass index (BMI) 28.0-28.9, adult: Secondary | ICD-10-CM | POA: Insufficient documentation

## 2023-05-08 DIAGNOSIS — E669 Obesity, unspecified: Secondary | ICD-10-CM | POA: Diagnosis not present

## 2023-05-08 DIAGNOSIS — M25561 Pain in right knee: Secondary | ICD-10-CM | POA: Insufficient documentation

## 2023-05-08 NOTE — ED Triage Notes (Signed)
  Patient comes in with R knee pain that started on 2/20.  Patient states he went to bed fine and woke up with knee pain and soreness.  Was seen at [facility] on 2/21 and given knee brace and ibuprofen but states it isn't helping.  Patient just got off work and states it got worse as the night went on.  Pain 10/10, sharp.  Took 800 mg ibuprofen around 2130 this evening.

## 2023-05-09 MED ORDER — OXYCODONE-ACETAMINOPHEN 5-325 MG PO TABS: 1.0000 | ORAL_TABLET | Freq: Four times a day (QID) | ORAL | 0 refills | Status: DC | PRN

## 2023-05-09 MED ORDER — OXYCODONE-ACETAMINOPHEN 5-325 MG PO TABS
1.0000 | ORAL_TABLET | Freq: Once | ORAL | Status: AC
  Administered 2023-05-09: 1 via ORAL
  Filled 2023-05-09: qty 1

## 2023-05-09 MED ORDER — KETOROLAC TROMETHAMINE 15 MG/ML IJ SOLN
15.0000 mg | Freq: Once | INTRAMUSCULAR | Status: AC
  Administered 2023-05-09: 15 mg via INTRAMUSCULAR
  Filled 2023-05-09: qty 1

## 2023-05-09 MED ORDER — KETOROLAC TROMETHAMINE 15 MG/ML IJ SOLN: 15.0000 mg | Freq: Once | INTRAMUSCULAR | Status: DC

## 2023-05-09 NOTE — Discharge Instructions (Signed)
 You were seen in the emergency department today for your knee pain.  Your physical exam and vital signs are very reassuring. Please follow up with the orthopedist listed below.   You likely have inflammation of the bursa in your knee. This can be quite painful.  To help with your pain you may take Tylenol and / or NSAID medication (such as ibuprofen or naproxen) to help with your pain.    You may also utilize topical pain relief such as Biofreeze, IcyHot, or topical lidocaine patches.  I also recommend that you apply heat to the area, such as a hot shower or heating pad, and follow heat application with massage of the muscles that are most tight.  Please return to the emergency department if you develop any numbness/tingling/weakness in your arms or legs, any difficulty urinating, or urinary incontinence chest pain, shortness of breath, abdominal pain, nausea or vomiting that does not stop, or any other new severe symptoms.

## 2023-05-09 NOTE — ED Provider Notes (Signed)
 [place] EMERGENCY DEPARTMENT AT [facility] Provider Note   CSN: [id number] Arrival date & time: 05/08/23  2333     History  Chief Complaint  Patient presents with   Knee Pain    Donald Wheeler is a 53 y.o. male who concern for 48 hours of right knee pain.  States that he went to bed on Friday feeling normal and woke up Saturday morning with pain in the right knee that is impairing his ability to function normally.  He is ambulatory in the leg but with significant pain in the anterior knee.  Denies any recent history, denies any hobbies or occupations that require him to kneel regularly, no history of injury to the right knee in the past, no recent procedures to the knee. Denies much improvement to the knee with the brace provided when he was seen in the emergency department on 2/21.  Reassuring x-ray at that time.  Has been taking Tylenol and ibuprofen with minimal improvement.  Patient with history of HIV on Biktarvy, CKD, CVA, diabetes.  Endorses compliance with all of his medications.  Has not missed any doses in the last week.  Patient works at [employer]. HPI     Home Medications Prior to Admission medications   Medication Sig Start Date End Date Taking? Authorizing Provider  oxyCODONE-acetaminophen (PERCOCET/ROXICET) 5-325 MG tablet Take 1 tablet by mouth every 6 (six) hours as needed for severe pain (pain score 7-10). 05/09/23  Yes Sarah Baez R, PA-C  amLODipine (NORVASC) 10 MG tablet Take 1 tablet (10 mg total) by mouth daily. 02/10/23   Donell Beers, FNP  aspirin EC (ASPIRIN LOW DOSE) 81 MG tablet Take 1 tablet (81 mg total) by mouth daily. Swallow whole. 02/25/23   Ivonne Andrew, NP  atorvastatin (LIPITOR) 80 MG tablet Take 1 tablet (80 mg total) by mouth daily. 02/25/23   Ivonne Andrew, NP  atorvastatin (LIPITOR) 80 MG tablet Take 1 tablet (80 mg total) by mouth daily. 02/25/23   Ivonne Andrew, NP   bictegravir-emtricitabine-tenofovir AF (BIKTARVY) 50-200-25 MG TABS tablet Take 1 tablet by mouth daily. 04/09/23   Judyann Munson, MD  Blood Glucose Monitoring Suppl DEVI Use to check blood sugar twice daily. May substitute to any manufacturer covered by patient's insurance. Patient not taking: Reported on 04/08/2023 02/02/23   Ivonne Andrew, NP  FLUoxetine (PROZAC) 10 MG capsule Take 1 capsule (10 mg total) by mouth daily. 04/08/23   Judyann Munson, MD  Glucose Blood (BLOOD GLUCOSE TEST STRIPS) STRP Use to check blood sugar twice daily. May substitute to any manufacturer covered by patient's insurance. Patient not taking: Reported on 04/08/2023 02/02/23   Ivonne Andrew, NP  hydrochlorothiazide (HYDRODIURIL) 12.5 MG tablet Take 1 tablet (12.5 mg total) by mouth daily. 02/02/23   Ivonne Andrew, NP  Lancet Device MISC Use to check blood sugar twice daily. May substitute to any manufacturer covered by patient's insurance. 02/02/23   Ivonne Andrew, NP  Lancets Misc. MISC May substitute to any manufacturer covered by patient's insurance. 02/02/23   Ivonne Andrew, NP  lidocaine (LIDODERM) 5 % Place 1 patch onto the skin daily. Remove & Discard patch within 12 hours or as directed by MD 05/07/23   Dolphus Jenny, PA-C  losartan (COZAAR) 50 MG tablet Take 1 tablet (50 mg total) by mouth daily. 02/02/23   Ivonne Andrew, NP  losartan (COZAAR) 50 MG tablet Take 1 tablet (50 mg total) by  mouth daily. 02/06/23   Judyann Munson, MD  metFORMIN (GLUCOPHAGE) 1000 MG tablet Take 1 tablet (1,000 mg total) by mouth daily with breakfast. 02/02/23   Ivonne Andrew, NP  mirtazapine (REMERON) 15 MG tablet Take 1 tablet (15 mg total) by mouth at bedtime. 02/02/23   Ivonne Andrew, NP  sildenafil (VIAGRA) 25 MG tablet Take 1 tablet (25 mg total) by mouth daily as needed for erectile dysfunction. 02/02/23   Ivonne Andrew, NP      Allergies    Patient has no known allergies.    Review of Systems    Review of Systems  Musculoskeletal:        Right knee pain    Physical Exam Updated Vital Signs BP 138/68 (BP Location: Left Arm)   Pulse 73   Temp 97.6 F (36.4 C) (Oral)   Resp 19   Ht 5\' 7"  (1.702 m)   Wt 81.6 kg   SpO2 100%   BMI 28.19 kg/m  Physical Exam Vitals and nursing note reviewed.  Constitutional:      Appearance: He is obese. He is not ill-appearing or toxic-appearing.  HENT:     Head: Normocephalic and atraumatic.  Eyes:     General: No scleral icterus.       Right eye: No discharge.        Left eye: No discharge.     Conjunctiva/sclera: Conjunctivae normal.  Pulmonary:     Effort: Pulmonary effort is normal.  Musculoskeletal:     Right upper leg: Normal.     Left upper leg: Normal.     Right knee: Erythema and bony tenderness present. No swelling or crepitus. Decreased range of motion. No LCL laxity, MCL laxity, ACL laxity or PCL laxity. Normal alignment and normal patellar mobility.     Left knee: Normal.     Right lower leg: Normal.     Left lower leg: Normal.     Right ankle: Normal.     Right Achilles Tendon: Normal.     Left ankle: Normal.     Left Achilles Tendon: Normal.     Right foot: Normal.     Left foot: Normal.  Skin:    General: Skin is warm and dry.     Comments: Mild erythema over the anterior right knee after removal of lidocaine patch, without any warmth to the touch or swelling.  Patient able to fully flex the knee but with pain with progressive flexion of the knee.  Able to fully extend the knee.  Patient ambulatory though with antalgic gait favoring the right leg.  Neurovascularly intact in the leg distal to the site of pain.  Neurological:     General: No focal deficit present.     Mental Status: He is alert.  Psychiatric:        Mood and Affect: Mood normal.     ED Results / Procedures / Treatments   Labs (all labs ordered are listed, but only abnormal results are displayed) Labs Reviewed - No data to  display  EKG None  Radiology DG Knee Complete 4 Views Right Result Date: 05/07/2023 CLINICAL DATA:  Knee pain without injury. EXAM: RIGHT KNEE - COMPLETE 4+ VIEW COMPARISON:  None Available. FINDINGS: No evidence of fracture, dislocation, or joint effusion. No evidence of arthropathy or other focal bone abnormality. Soft tissues are unremarkable aside from scattered calcific plaques in the superficial femoral artery and the popliteal trifurcation arteries. IMPRESSION: No acute radiographic findings. Atherosclerosis. Electronically  Signed   By: Almira Bar M.D.   On: 05/07/2023 06:59    Procedures Procedures    Medications Ordered in ED Medications  oxyCODONE-acetaminophen (PERCOCET/ROXICET) 5-325 MG per tablet 1 tablet (1 tablet Oral Given 05/09/23 0155)  ketorolac (TORADOL) 15 MG/ML injection 15 mg (15 mg Intramuscular Given 05/09/23 0155)    ED Course/ Medical Decision Making/ A&P                                 Medical Decision Making 53 year old male with right knee pain, atraumatic.  Normal vitals on intake.  Cardiopulmonary exam unremarkable.  Neurovascularly intact in the right lower leg.  Musculoskeletal exam as above.  Differential diagnosis includes is not limited to prepatellar bursitis, septic bursitis, septic arthritis, cellulitis.  Amount and/or Complexity of Data Reviewed External Data Reviewed: radiology.    Details: Unremarkable x-ray of the right knee Labs:     Details: CD4 574 in January of this year.  Risk Prescription drug management.   Clinical concern for septic bursitis or arthritis is exceedingly low.  Patient with HIV but with compliance with antiretroviral therapy and without evidence of immunosuppression on labs from January of this year.  No evidence of malalignment to the knee or laxity of the main ligamentous structures of the knee on physical exam.  Clinical picture most consistent with prepatellar bursitis, recommend NSAIDs, RICE, crutches  provided for comfort.  Clinical concern for emergent underlying condition that would warrant further ED workup and patient management is exceedingly low.  Myer  voiced understanding of his medical evaluation and treatment plan. Each of their questions answered to their expressed satisfaction.  Return precautions were given.  Patient is well-appearing, stable, and was discharged in good condition.  This chart was dictated using voice recognition software, Dragon. Despite the best efforts of this provider to proofread and correct errors, errors may still occur which can change documentation meaning.         Final Clinical Impression(s) / ED Diagnoses Final diagnoses:  Acute pain of right knee    Rx / DC Orders ED Discharge Orders          Ordered    oxyCODONE-acetaminophen (PERCOCET/ROXICET) 5-325 MG tablet  Every 6 hours PRN        05/09/23 0203              Babak Lucus, Eugene Gavia, PA-C 05/09/23 0304    Maia Plan, MD 05/12/23 [phone]

## 2023-05-11 ENCOUNTER — Telehealth: Payer: Self-pay

## 2023-05-11 ENCOUNTER — Telehealth: Payer: Self-pay | Admitting: Nurse Practitioner

## 2023-05-11 ENCOUNTER — Other Ambulatory Visit (HOSPITAL_COMMUNITY): Payer: Self-pay

## 2023-05-11 NOTE — Telephone Encounter (Signed)
 Copied from CRM [id number]. Topic: General - Other >> May 10, 2023  3:09 PM Kristie Cowman wrote: Reason for CRM: Patient was seen in the ER on Saturday night.  Patient needs a note from Angus Seller, NP stating that he is able to return to work on 3/3.  Patient has an out of work note from the ER stating he is to stay out of work until 3/3.

## 2023-05-11 NOTE — Transitions of Care (Post Inpatient/ED Visit) (Signed)
 05/11/2023  Name: Armin Yerger MRN: [id number] DOB: 1970/04/06  Today's TOC FU Call Status:   Patient's Name and Date of Birth confirmed.  Transition Care Management Follow-up Telephone Call Date of Discharge: 05/09/23 Discharge Facility: [facility]) Type of Discharge: Emergency Department Reason for ED Visit: Orthopedic Conditions How have you been since you were released from the hospital?: Better Any questions or concerns?: No  Items Reviewed: Did you receive and understand the discharge instructions provided?: Yes Medications obtained,verified, and reconciled?: Yes (Medications Reviewed) Any new allergies since your discharge?: No Dietary orders reviewed?: No Do you have support at home?: No  Medications Reviewed Today: Medications Reviewed Today     Reviewed by Veneta Penton, CMA (Certified Medical Assistant) on 05/11/23 at 1012  Med List Status: <None>   Medication Order Taking? Sig Documenting Provider Last Dose Status Informant  amLODipine (NORVASC) 10 MG tablet [id number] Yes Take 1 tablet (10 mg total) by mouth daily. Donell Beers, FNP Taking Active   aspirin EC (ASPIRIN LOW DOSE) 81 MG tablet [id number] Yes Take 1 tablet (81 mg total) by mouth daily. Swallow whole. Ivonne Andrew, NP Taking Active   atorvastatin (LIPITOR) 80 MG tablet [id number] Yes Take 1 tablet (80 mg total) by mouth daily. Ivonne Andrew, NP Taking Active   atorvastatin (LIPITOR) 80 MG tablet [id number] Yes Take 1 tablet (80 mg total) by mouth daily. Ivonne Andrew, NP Taking Active   bictegravir-emtricitabine-tenofovir AF (BIKTARVY) 50-200-25 MG TABS tablet [id number] Yes Take 1 tablet by mouth daily. Judyann Munson, MD Taking Active   Blood Glucose Monitoring Suppl DEVI [id number] No Use to check blood sugar twice daily. May substitute to any manufacturer covered by patient's insurance.  Patient not taking: Reported on 02/25/2023   Ivonne Andrew, NP Not Taking Active    FLUoxetine (PROZAC) 10 MG capsule [id number] Yes Take 1 capsule (10 mg total) by mouth daily. Judyann Munson, MD Taking Active   Glucose Blood (BLOOD GLUCOSE TEST STRIPS) STRP [id number] No Use to check blood sugar twice daily. May substitute to any manufacturer covered by patient's insurance.  Patient not taking: Reported on 02/25/2023   Ivonne Andrew, NP Not Taking Active   hydrochlorothiazide (HYDRODIURIL) 12.5 MG tablet [id number] Yes Take 1 tablet (12.5 mg total) by mouth daily. Ivonne Andrew, NP Taking Active   Lancet Device MISC [id number] Yes Use to check blood sugar twice daily. May substitute to any manufacturer covered by patient's insurance. Ivonne Andrew, NP Taking Active   Lancets Misc. MISC [id number] Yes May substitute to any manufacturer covered by patient's insurance. Ivonne Andrew, NP Taking Active   lidocaine (LIDODERM) 5 % [id number] Yes Place 1 patch onto the skin daily. Remove & Discard patch within 12 hours or as directed by MD Dolphus Jenny, PA-C Taking Active   losartan (COZAAR) 50 MG tablet [id number] Yes Take 1 tablet (50 mg total) by mouth daily. Ivonne Andrew, NP Taking Active   losartan (COZAAR) 50 MG tablet [id number] Yes Take 1 tablet (50 mg total) by mouth daily. Judyann Munson, MD Taking Active   metFORMIN (GLUCOPHAGE) 1000 MG tablet [id number] Yes Take 1 tablet (1,000 mg total) by mouth daily with breakfast. Ivonne Andrew, NP Taking Active   mirtazapine (REMERON) 15 MG tablet [id number] Yes Take 1 tablet (15 mg total) by mouth at bedtime. Ivonne Andrew, NP Taking Active   oxyCODONE-acetaminophen (PERCOCET/ROXICET) 5-325 MG tablet [id number] Yes Take 1 tablet by mouth every 6 (  six) hours as needed for severe pain (pain score 7-10). Sponseller, Eugene Gavia, PA-C Taking Active   sildenafil (VIAGRA) 25 MG tablet [id number] Yes Take 1 tablet (25 mg total) by mouth daily as needed for erectile dysfunction. Ivonne Andrew, NP Taking Active              Home Care and Equipment/Supplies: Were Home Health Services Ordered?: No Any new equipment or medical supplies ordered?: Yes Were you able to get the equipment/medical supplies?: Yes Do you have any questions related to the use of the equipment/supplies?: No  Functional Questionnaire: Do you need assistance with bathing/showering or dressing?: No Do you need assistance with meal preparation?: No Do you need assistance with eating?: No Do you have difficulty maintaining continence: No Do you need assistance with getting out of bed/getting out of a chair/moving?: No Do you have difficulty managing or taking your medications?: No  Follow up appointments reviewed: PCP Follow-up appointment confirmed?: Yes Date of PCP follow-up appointment?: 05/28/23 Follow-up Provider: Angus Seller Specialist [facility] Follow-up appointment confirmed?: NA Do you need transportation to your follow-up appointment?: No Do you understand care options if your condition(s) worsen?: Yes-patient verbalized understanding    SIGNATURE Aliyyah Riese, CMA

## 2023-05-14 ENCOUNTER — Other Ambulatory Visit (HOSPITAL_COMMUNITY): Payer: Self-pay

## 2023-05-19 ENCOUNTER — Other Ambulatory Visit: Payer: Self-pay

## 2023-05-19 ENCOUNTER — Telehealth: Payer: Self-pay | Admitting: Nurse Practitioner

## 2023-05-19 ENCOUNTER — Other Ambulatory Visit (HOSPITAL_COMMUNITY): Payer: Self-pay

## 2023-05-19 ENCOUNTER — Ambulatory Visit (INDEPENDENT_AMBULATORY_CARE_PROVIDER_SITE_OTHER): Payer: Commercial Managed Care - PPO | Admitting: Internal Medicine

## 2023-05-19 ENCOUNTER — Encounter: Payer: Self-pay | Admitting: Internal Medicine

## 2023-05-19 VITALS — BP 117/68 | HR 53 | Ht 67.0 in | Wt 180.0 lb

## 2023-05-19 DIAGNOSIS — B20 Human immunodeficiency virus [HIV] disease: Secondary | ICD-10-CM | POA: Diagnosis not present

## 2023-05-19 DIAGNOSIS — Z79899 Other long term (current) drug therapy: Secondary | ICD-10-CM

## 2023-05-19 DIAGNOSIS — Z21 Asymptomatic human immunodeficiency virus [HIV] infection status: Secondary | ICD-10-CM

## 2023-05-19 DIAGNOSIS — I10 Essential (primary) hypertension: Secondary | ICD-10-CM

## 2023-05-19 DIAGNOSIS — Z0000 Encounter for general adult medical examination without abnormal findings: Secondary | ICD-10-CM

## 2023-05-19 MED ORDER — ASPIRIN 81 MG PO TBEC
81.0000 mg | DELAYED_RELEASE_TABLET | Freq: Every day | ORAL | 3 refills | Status: DC
  Filled 2023-05-19 – 2023-05-24 (×5): qty 100, 100d supply, fill #0

## 2023-05-19 MED ORDER — BIKTARVY 50-200-25 MG PO TABS
1.0000 | ORAL_TABLET | Freq: Every day | ORAL | 11 refills | Status: AC
  Filled 2023-05-19 (×3): qty 30, 30d supply, fill #0
  Filled 2023-06-14: qty 30, 30d supply, fill #1
  Filled 2023-07-15: qty 30, 30d supply, fill #2
  Filled 2023-08-20 (×2): qty 30, 30d supply, fill #3
  Filled 2023-09-16: qty 30, 30d supply, fill #4
  Filled 2023-10-15 – 2023-11-12 (×2): qty 30, 30d supply, fill #5
  Filled 2023-12-14: qty 30, 30d supply, fill #6
  Filled 2024-01-14: qty 30, 30d supply, fill #7
  Filled 2024-02-09 – 2024-02-14 (×2): qty 30, 30d supply, fill #8
  Filled 2024-03-08: qty 30, 30d supply, fill #9
  Filled 2024-04-07: qty 30, 30d supply, fill #10

## 2023-05-19 MED ORDER — AMLODIPINE BESYLATE 10 MG PO TABS
10.0000 mg | ORAL_TABLET | Freq: Every day | ORAL | 3 refills | Status: AC
  Filled 2023-05-19: qty 90, 90d supply, fill #0
  Filled 2023-08-25: qty 90, 90d supply, fill #1
  Filled 2023-11-26: qty 90, 90d supply, fill #2
  Filled 2023-12-10 – 2024-03-25 (×2): qty 90, 90d supply, fill #3

## 2023-05-19 MED ORDER — LOSARTAN POTASSIUM 50 MG PO TABS
50.0000 mg | ORAL_TABLET | Freq: Every day | ORAL | 1 refills | Status: DC
  Filled 2023-05-19: qty 90, 90d supply, fill #0
  Filled 2023-08-30: qty 90, 90d supply, fill #1

## 2023-05-19 NOTE — Progress Notes (Signed)
 Specialty Pharmacy Refill Coordination Note  Donald Wheeler is a 53 y.o. male contacted today regarding refills of specialty medication(s) Bictegravir-Emtricitab-Tenofov Musician)   Patient requested [name] at [facility] at [place] date: 05/20/23   Medication will be filled on 05/20/23.

## 2023-05-19 NOTE — Telephone Encounter (Signed)
 Patient Donald Wheeler requested amLODipine, and losartan to be send to [facility]. Has an appointment on 3/14, she stated he is completely out

## 2023-05-19 NOTE — Progress Notes (Signed)
 Patient ID: Donald Wheeler, male   DOB: 1970-12-22, 53 y.o.   MRN: [id number]  HPI  Donald Wheeler is a 53yo M with well controlled hiv disease, HTN, T2DM, he reports doing well with taking biktarvy daily. Not missing doses. No recent illnesses Outpatient Encounter Medications as of 05/19/2023  Medication Sig   atorvastatin (LIPITOR) 80 MG tablet Take 1 tablet (80 mg total) by mouth daily.   Blood Glucose Monitoring Suppl DEVI Use to check blood sugar twice daily. May substitute to any manufacturer covered by patient's insurance.   Glucose Blood (BLOOD GLUCOSE TEST STRIPS) STRP Use to check blood sugar twice daily. May substitute to any manufacturer covered by patient's insurance.   Lancet Device MISC Use to check blood sugar twice daily. May substitute to any manufacturer covered by patient's insurance.   Lancets Misc. MISC May substitute to any manufacturer covered by patient's insurance.   lidocaine (LIDODERM) 5 % Place 1 patch onto the skin daily. Remove & Discard patch within 12 hours or as directed by MD   losartan (COZAAR) 50 MG tablet Take 1 tablet (50 mg total) by mouth daily.   metFORMIN (GLUCOPHAGE) 1000 MG tablet Take 1 tablet (1,000 mg total) by mouth daily with breakfast.   sildenafil (VIAGRA) 25 MG tablet Take 1 tablet (25 mg total) by mouth daily as needed for erectile dysfunction.   [DISCONTINUED] amLODipine (NORVASC) 10 MG tablet Take 1 tablet (10 mg total) by mouth daily.   [DISCONTINUED] aspirin EC (ASPIRIN LOW DOSE) 81 MG tablet Take 1 tablet (81 mg total) by mouth daily. Swallow whole.   [DISCONTINUED] bictegravir-emtricitabine-tenofovir AF (BIKTARVY) 50-200-25 MG TABS tablet Take 1 tablet by mouth daily.   [DISCONTINUED] losartan (COZAAR) 50 MG tablet Take 1 tablet (50 mg total) by mouth daily.   amLODipine (NORVASC) 10 MG tablet Take 1 tablet (10 mg total) by mouth daily.   aspirin EC (ASPIRIN LOW DOSE) 81 MG tablet Take 1 tablet (81 mg total) by mouth daily. Swallow whole.    bictegravir-emtricitabine-tenofovir AF (BIKTARVY) 50-200-25 MG TABS tablet Take 1 tablet by mouth daily.   losartan (COZAAR) 50 MG tablet Take 1 tablet (50 mg total) by mouth daily.   [DISCONTINUED] atorvastatin (LIPITOR) 80 MG tablet Take 1 tablet (80 mg total) by mouth daily. (Patient not taking: Reported on 05/19/2023)   [DISCONTINUED] FLUoxetine (PROZAC) 10 MG capsule Take 1 capsule (10 mg total) by mouth daily. (Patient not taking: Reported on 05/19/2023)   [DISCONTINUED] hydrochlorothiazide (HYDRODIURIL) 12.5 MG tablet Take 1 tablet (12.5 mg total) by mouth daily. (Patient not taking: Reported on 05/19/2023)   [DISCONTINUED] mirtazapine (REMERON) 15 MG tablet Take 1 tablet (15 mg total) by mouth at bedtime. (Patient not taking: Reported on 05/19/2023)   [DISCONTINUED] oxyCODONE-acetaminophen (PERCOCET/ROXICET) 5-325 MG tablet Take 1 tablet by mouth every 6 (six) hours as needed for severe pain (pain score 7-10). (Patient not taking: Reported on 05/19/2023)   No facility-administered encounter medications on file as of 05/19/2023.     Patient Active Problem List   Diagnosis Date Noted   Uncontrolled type 2 diabetes mellitus with hyperglycemia (HCC) 08/04/2021   Healthcare maintenance 02/19/2021   PSA elevation 02/05/2021   Persistent microscopic hematuria 02/05/2021   Controlled type 2 diabetes mellitus with microalbuminuria, with long-term current use of insulin (HCC) 08/09/2019   Vitamin D deficiency 05/10/2019   Shingles 07/2018   Hyperosmolar non-ketotic state in patient with type 2 diabetes mellitus (HCC) 12/23/2017   Diabetes mellitus, new onset (HCC) 12/23/2017  History of hematuria 08/16/2017   Mixed hyperlipidemia 06/16/2017   Cerebral thrombosis with cerebral infarction 04/10/2017   Solitary pulmonary nodule 04/10/2017   Focal neurological deficit 04/09/2017   CKD (chronic kidney disease) stage 3, GFR 30-59 ml/min 04/09/2017   Smoker 04/09/2017   HTN (hypertension), benign 09/07/2013    HIV (human immunodeficiency virus infection) (HCC) 03/17/2012     Health Maintenance Due  Topic Date Due   Zoster Vaccines- Shingrix (1 of 2) Never done   Fecal DNA (Cologuard)  Never done   OPHTHALMOLOGY EXAM  08/27/2021   FOOT EXAM  02/03/2022   DTaP/Tdap/Td (3 - Td or Tdap) 02/21/2022   COVID-19 Vaccine (4 - 2024-25 season) 11/15/2022     Review of Systems Review of Systems  Constitutional: Negative for fever, chills, diaphoresis, activity change, appetite change, fatigue and unexpected weight change.  HENT: Negative for congestion, sore throat, rhinorrhea, sneezing, trouble swallowing and sinus pressure.  Eyes: Negative for photophobia and visual disturbance.  Respiratory: Negative for cough, chest tightness, shortness of breath, wheezing and stridor.  Cardiovascular: Negative for chest pain, palpitations and leg swelling.  Gastrointestinal: Negative for nausea, vomiting, abdominal pain, diarrhea, constipation, blood in stool, abdominal distention and anal bleeding.  Genitourinary: Negative for dysuria, hematuria, flank pain and difficulty urinating.  Musculoskeletal: Negative for myalgias, back pain, joint swelling, arthralgias and gait problem.  Skin: Negative for color change, pallor, rash and wound.  Neurological: Negative for dizziness, tremors, weakness and light-headedness.  Hematological: Negative for adenopathy. Does not bruise/bleed easily.  Psychiatric/Behavioral: Negative for behavioral problems, confusion, sleep disturbance, dysphoric mood, decreased concentration and agitation.   Physical Exam   BP (!) 143/77   Pulse (!) 45   Ht 5\' 7"  (1.702 m)   BMI 28.19 kg/m   Physical Exam  Constitutional: He is oriented to person, place, and time. He appears well-developed and well-nourished. No distress.  HENT:  Mouth/Throat: Oropharynx is clear and moist. No oropharyngeal exudate.  Cardiovascular: Normal rate, regular rhythm and normal heart sounds. Exam reveals no  gallop and no friction rub.  No murmur heard.  Pulmonary/Chest: Effort normal and breath sounds normal. No respiratory distress. He has no wheezes.  Abdominal: Soft. Bowel sounds are normal. He exhibits no distension. There is no tenderness.  Lymphadenopathy:  He has no cervical adenopathy.  Neurological: He is alert and oriented to person, place, and time.  Skin: Skin is warm and dry. No rash noted. No erythema.  Psychiatric: He has a normal mood and affect. His behavior is normal.   Lab Results  Component Value Date   CD4TCELL 24 (L) 04/08/2023   Lab Results  Component Value Date   CD4TABS 574 04/08/2023   CD4TABS 612 10/29/2022   CD4TABS 456 07/30/2022   Lab Results  Component Value Date   HIV1RNAQUANT <20 (H) 04/08/2023   Lab Results  Component Value Date   HEPBSAB NONREACTIVE 03/03/2012   Lab Results  Component Value Date   LABRPR NON-REACTIVE 04/08/2023    CBC Lab Results  Component Value Date   WBC 7.0 02/25/2023   RBC 5.08 02/25/2023   HGB 14.9 02/25/2023   HCT 45.2 02/25/2023   PLT 229 02/25/2023   MCV 89 02/25/2023   MCH 29.3 02/25/2023   MCHC 33.0 02/25/2023   RDW 14.1 02/25/2023   LYMPHSABS 2,147 07/30/2022   MONOABS 0.6 01/06/2021   EOSABS 128 07/30/2022    BMET Lab Results  Component Value Date   NA 142 04/08/2023   K 4.2 04/08/2023  CL 105 04/08/2023   CO2 27 04/08/2023   GLUCOSE 142 (H) 04/08/2023   BUN 17 04/08/2023   CREATININE 1.66 (H) 04/08/2023   CALCIUM 9.3 04/08/2023   GFRNONAA >60 01/06/2021   GFRAA 61 07/15/2020      Assessment and Plan  HIV disease= will check labs to see that his VL is well controlled; give refills on biktarvy  Long term medication management = will check cr to see that he is stable  Htn = slightly above goal initially. But on repeat was at goal. No change in medications at this time. Has bradycardia. But denies any syncope. Will continue to monitor. Have him bring up with pcp  Health  maintenance= No need for anal pap. He prefers male sex partners

## 2023-05-20 ENCOUNTER — Other Ambulatory Visit (HOSPITAL_COMMUNITY): Payer: Self-pay

## 2023-05-20 NOTE — Telephone Encounter (Signed)
 disregard

## 2023-05-24 ENCOUNTER — Other Ambulatory Visit (HOSPITAL_COMMUNITY): Payer: Self-pay

## 2023-05-28 ENCOUNTER — Ambulatory Visit (INDEPENDENT_AMBULATORY_CARE_PROVIDER_SITE_OTHER): Payer: Self-pay | Admitting: Nurse Practitioner

## 2023-05-28 ENCOUNTER — Other Ambulatory Visit (HOSPITAL_COMMUNITY): Payer: Self-pay

## 2023-05-28 ENCOUNTER — Encounter: Payer: Self-pay | Admitting: Nurse Practitioner

## 2023-05-28 ENCOUNTER — Other Ambulatory Visit: Payer: Self-pay

## 2023-05-28 VITALS — BP 139/69 | HR 46 | Temp 98.0°F | Wt 180.8 lb

## 2023-05-28 DIAGNOSIS — E118 Type 2 diabetes mellitus with unspecified complications: Secondary | ICD-10-CM

## 2023-05-28 DIAGNOSIS — N529 Male erectile dysfunction, unspecified: Secondary | ICD-10-CM | POA: Diagnosis not present

## 2023-05-28 LAB — POCT GLYCOSYLATED HEMOGLOBIN (HGB A1C): Hemoglobin A1C: 6.8 % — AB (ref 4.0–5.6)

## 2023-05-28 MED ORDER — ASPIRIN 81 MG PO TBEC
81.0000 mg | DELAYED_RELEASE_TABLET | Freq: Every day | ORAL | 3 refills | Status: DC
  Filled 2023-05-28: qty 100, 100d supply, fill #0

## 2023-05-28 MED ORDER — SILDENAFIL CITRATE 25 MG PO TABS
25.0000 mg | ORAL_TABLET | Freq: Every day | ORAL | 0 refills | Status: DC | PRN
Start: 2023-05-28 — End: 2023-08-18
  Filled 2023-05-28: qty 6, 30d supply, fill #0
  Filled 2023-07-15: qty 4, 20d supply, fill #1

## 2023-05-28 MED ORDER — ASPIRIN 81 MG PO TBEC
81.0000 mg | DELAYED_RELEASE_TABLET | Freq: Every day | ORAL | 3 refills | Status: DC
  Filled 2023-05-28: qty 100, 90d supply, fill #0

## 2023-05-28 NOTE — Patient Instructions (Signed)
 1. DM type 2, controlled, with complication (HCC) (Primary)  - POCT glycosylated hemoglobin (Hb A1C)  2. Erectile dysfunction, unspecified erectile dysfunction type  - sildenafil (VIAGRA) 25 MG tablet; Take 1 tablet (25 mg total) by mouth daily as needed for erectile dysfunction.  Dispense: 10 tablet; Refill: 0

## 2023-05-28 NOTE — Progress Notes (Signed)
 Subjective   Patient ID: Donald Wheeler, male    DOB: March 15, 1971, 53 y.o.   MRN: [id number]  Chief Complaint  Patient presents with   Follow-up    Referring provider: Ivonne Andrew, NP  Donald Wheeler is a 53 y.o. male with Past Medical History: 09/07/2013: Bell's palsy 12/2017: Diabetes mellitus without complication (HCC)     Comment:  NEW ONSET  No date: HIV (human immunodeficiency virus infection) (HCC)     Comment:  dx'd 2000s No date: Hypertension 04/09/2017: Stroke [facility])     Comment:  vs TIA w/acute onset of right-sided weakness as well as               trouble speaking/notes 04/09/2017   HPI  Diabetes He presents for his follow-up diabetic visit. He has type 2 diabetes mellitus. No MedicAlert identification noted. His disease course has been stable. There are no hypoglycemic associated symptoms. There are no diabetic associated symptoms. There are no hypoglycemic complications. Symptoms are stable. Risk factors for coronary artery disease include diabetes mellitus, hypertension, male sex and sedentary lifestyle. He is compliant with treatment some of the time. He is following a diabetic diet. Meal planning includes avoidance of concentrated sweets. He has not had a previous visit with a dietitian. He rarely participates in exercise. A1C in office today is 6.8.   Hypertension This is a chronic problem. The problem is unchanged. The problem is controlled. There are no associated agents to hypertension. Risk factors for coronary artery disease include diabetes mellitus, male gender, sedentary lifestyle and family history. Compliance problems include medication cost.    Denies f/c/s, n/v/d, hemoptysis, PND, leg swelling Denies chest pain or edema   No Known Allergies  Immunization History  Administered Date(s) Administered   Hepatitis A 02/22/2012   Hepatitis A, Adult 02/20/2013   Hepatitis B 02/22/2012, 03/17/2012   Influenza Split 03/17/2012   Influenza Whole  03/17/2019   Influenza,inj,Quad PF,6+ Mos 02/20/2013, 01/03/2014, 11/07/2018, 01/26/2022   Meningococcal Mcv4o 05/11/2016   PFIZER(Purple Top)SARS-COV-2 Vaccination 05/29/2019, 06/20/2019, 01/05/2020   PNEUMOCOCCAL CONJUGATE-20 07/22/2021   Pneumococcal Polysaccharide-23 03/17/2012   Td 02/22/2012   Tdap 02/22/2012    Tobacco History: Social History   Tobacco Use  Smoking Status Some Days   Current packs/day: 0.00   Average packs/day: 0.2 packs/day for 15.0 years (3.0 ttl pk-yrs)   Types: Cigars, Cigarettes   Start date: 12/13/2002   Last attempt to quit: 12/12/2017   Years since quitting: 5.4  Smokeless Tobacco Never  Tobacco Comments   black and milds daily   Ready to quit: Yes Counseling given: Yes Tobacco comments: black and milds daily   Outpatient Encounter Medications as of 05/28/2023  Medication Sig   amLODipine (NORVASC) 10 MG tablet Take 1 tablet (10 mg total) by mouth daily.   atorvastatin (LIPITOR) 80 MG tablet Take 1 tablet (80 mg total) by mouth daily.   bictegravir-emtricitabine-tenofovir AF (BIKTARVY) 50-200-25 MG TABS tablet Take 1 tablet by mouth daily.   Blood Glucose Monitoring Suppl DEVI Use to check blood sugar twice daily. May substitute to any manufacturer covered by patient's insurance.   lidocaine (LIDODERM) 5 % Place 1 patch onto the skin daily. Remove & Discard patch within 12 hours or as directed by MD   losartan (COZAAR) 50 MG tablet Take 1 tablet (50 mg total) by mouth daily.   metFORMIN (GLUCOPHAGE) 1000 MG tablet Take 1 tablet (1,000 mg total) by mouth daily with breakfast.   [DISCONTINUED] sildenafil (VIAGRA) 25 MG  tablet Take 1 tablet (25 mg total) by mouth daily as needed for erectile dysfunction.   aspirin EC (ASPIRIN LOW DOSE) 81 MG tablet Take 1 tablet (81 mg total) by mouth daily. Swallow whole.   Glucose Blood (BLOOD GLUCOSE TEST STRIPS) STRP Use to check blood sugar twice daily. May substitute to any manufacturer covered by patient's  insurance. (Patient not taking: Reported on 05/28/2023)   Lancet Device MISC Use to check blood sugar twice daily. May substitute to any manufacturer covered by patient's insurance. (Patient not taking: Reported on 05/28/2023)   Lancets Misc. MISC May substitute to any manufacturer covered by patient's insurance. (Patient not taking: Reported on 05/28/2023)   losartan (COZAAR) 50 MG tablet Take 1 tablet (50 mg total) by mouth daily.   sildenafil (VIAGRA) 25 MG tablet Take 1 tablet (25 mg total) by mouth daily as needed for erectile dysfunction.   [DISCONTINUED] aspirin EC (ASPIRIN LOW DOSE) 81 MG tablet Take 1 tablet (81 mg total) by mouth daily. Swallow whole. (Patient not taking: Reported on 05/28/2023)   [DISCONTINUED] aspirin EC (ASPIRIN LOW DOSE) 81 MG tablet Take 1 tablet (81 mg total) by mouth daily. Swallow whole.   No facility-administered encounter medications on file as of 05/28/2023.    Review of Systems  Review of Systems  Constitutional: Negative.   HENT: Negative.    Cardiovascular: Negative.   Gastrointestinal: Negative.   Allergic/Immunologic: Negative.   Neurological: Negative.   Psychiatric/Behavioral: Negative.       Objective:   BP 139/69   Pulse (!) 46   Temp 98 F (36.7 C) (Oral)   Wt 180 lb 12.8 oz (82 kg)   SpO2 97%   BMI 28.32 kg/m   Wt Readings from Last 5 Encounters:  05/28/23 180 lb 12.8 oz (82 kg)  05/19/23 180 lb (81.6 kg)  05/08/23 180 lb (81.6 kg)  05/07/23 180 lb (81.6 kg)  04/08/23 183 lb 6.4 oz (83.2 kg)     Physical Exam Vitals and nursing note reviewed.  Constitutional:      General: He is not in acute distress.    Appearance: He is well-developed.  Cardiovascular:     Rate and Rhythm: Normal rate and regular rhythm.  Pulmonary:     Effort: Pulmonary effort is normal.     Breath sounds: Normal breath sounds.  Skin:    General: Skin is warm and dry.  Neurological:     Mental Status: He is alert and oriented to person, place, and  time.       Assessment & Plan:   DM type 2, controlled, with complication (HCC) -     POCT glycosylated hemoglobin (Hb A1C)  Erectile dysfunction, unspecified erectile dysfunction type -     Sildenafil Citrate; Take 1 tablet (25 mg total) by mouth daily as needed for erectile dysfunction.  Dispense: 10 tablet; Refill: 0  Other orders -     Aspirin; Take 1 tablet (81 mg total) by mouth daily. Swallow whole.  Dispense: 100 tablet; Refill: 3     Return in about 6 months (around 11/28/2023).   Ivonne Andrew, NP 05/28/2023

## 2023-06-11 ENCOUNTER — Other Ambulatory Visit: Payer: Self-pay

## 2023-06-14 ENCOUNTER — Other Ambulatory Visit: Payer: Self-pay

## 2023-06-14 ENCOUNTER — Other Ambulatory Visit: Payer: Self-pay | Admitting: Pharmacy Technician

## 2023-06-14 NOTE — Progress Notes (Signed)
 Specialty Pharmacy Refill Coordination Note  Donald Wheeler is a 53 y.o. male contacted today regarding refills of specialty medication(s) Bictegravir-Emtricitab-Tenofov Musician)   Patient requested Donald Wheeler at [facility] at [place] date: 06/18/23   Medication will be filled on 06/17/23.

## 2023-06-17 NOTE — Telephone Encounter (Signed)
Please disreguard ?

## 2023-07-12 ENCOUNTER — Other Ambulatory Visit: Payer: Self-pay

## 2023-07-15 ENCOUNTER — Other Ambulatory Visit: Payer: Self-pay | Admitting: Pharmacy Technician

## 2023-07-15 ENCOUNTER — Other Ambulatory Visit: Payer: Self-pay

## 2023-07-15 ENCOUNTER — Other Ambulatory Visit (HOSPITAL_COMMUNITY): Payer: Self-pay

## 2023-07-15 NOTE — Progress Notes (Signed)
 Specialty Pharmacy Refill Coordination Note  Donald Wheeler is a 53 y.o. male contacted today regarding refills of specialty medication(s) Bictegravir-Emtricitab-Tenofov (Biktarvy )   Patient requested [name] at [facility] at [place] date: 07/22/23   Medication will be filled on 07/21/23.

## 2023-07-21 ENCOUNTER — Other Ambulatory Visit: Payer: Self-pay

## 2023-07-29 ENCOUNTER — Other Ambulatory Visit (HOSPITAL_COMMUNITY): Payer: Self-pay

## 2023-08-03 ENCOUNTER — Other Ambulatory Visit (HOSPITAL_COMMUNITY): Payer: Self-pay

## 2023-08-03 ENCOUNTER — Other Ambulatory Visit: Payer: Self-pay

## 2023-08-03 ENCOUNTER — Emergency Department (HOSPITAL_COMMUNITY)

## 2023-08-03 ENCOUNTER — Telehealth: Payer: Self-pay | Admitting: Internal Medicine

## 2023-08-03 ENCOUNTER — Emergency Department (HOSPITAL_COMMUNITY)
Admission: EM | Admit: 2023-08-03 | Discharge: 2023-08-03 | Disposition: A | Discharge status: Home / Self Care | Attending: Emergency Medicine | Admitting: Emergency Medicine

## 2023-08-03 DIAGNOSIS — N183 Chronic kidney disease, stage 3 unspecified: Secondary | ICD-10-CM | POA: Diagnosis not present

## 2023-08-03 DIAGNOSIS — R748 Abnormal levels of other serum enzymes: Secondary | ICD-10-CM | POA: Diagnosis not present

## 2023-08-03 DIAGNOSIS — R002 Palpitations: Secondary | ICD-10-CM | POA: Diagnosis not present

## 2023-08-03 DIAGNOSIS — I129 Hypertensive chronic kidney disease with stage 1 through stage 4 chronic kidney disease, or unspecified chronic kidney disease: Secondary | ICD-10-CM | POA: Diagnosis not present

## 2023-08-03 DIAGNOSIS — Z79899 Other long term (current) drug therapy: Secondary | ICD-10-CM | POA: Diagnosis not present

## 2023-08-03 DIAGNOSIS — R600 Localized edema: Secondary | ICD-10-CM | POA: Diagnosis not present

## 2023-08-03 DIAGNOSIS — E1122 Type 2 diabetes mellitus with diabetic chronic kidney disease: Secondary | ICD-10-CM | POA: Diagnosis not present

## 2023-08-03 DIAGNOSIS — Z7982 Long term (current) use of aspirin: Secondary | ICD-10-CM | POA: Diagnosis not present

## 2023-08-03 DIAGNOSIS — R0602 Shortness of breath: Secondary | ICD-10-CM | POA: Diagnosis not present

## 2023-08-03 DIAGNOSIS — R0789 Other chest pain: Secondary | ICD-10-CM | POA: Diagnosis not present

## 2023-08-03 DIAGNOSIS — Z21 Asymptomatic human immunodeficiency virus [HIV] infection status: Secondary | ICD-10-CM | POA: Diagnosis not present

## 2023-08-03 DIAGNOSIS — R072 Precordial pain: Secondary | ICD-10-CM | POA: Insufficient documentation

## 2023-08-03 DIAGNOSIS — Z7984 Long term (current) use of oral hypoglycemic drugs: Secondary | ICD-10-CM | POA: Diagnosis not present

## 2023-08-03 DIAGNOSIS — R079 Chest pain, unspecified: Secondary | ICD-10-CM | POA: Diagnosis not present

## 2023-08-03 LAB — CBC WITH DIFFERENTIAL/PLATELET
Abs Immature Granulocytes: 0.01 10*3/uL (ref 0.00–0.07)
Basophils Absolute: 0 10*3/uL (ref 0.0–0.1)
Basophils Relative: 0 %
Eosinophils Absolute: 0.1 10*3/uL (ref 0.0–0.5)
Eosinophils Relative: 2 %
HCT: 45.3 % (ref 39.0–52.0)
Hemoglobin: 15.3 g/dL (ref 13.0–17.0)
Immature Granulocytes: 0 %
Lymphocytes Relative: 39 %
Lymphs Abs: 2.4 10*3/uL (ref 0.7–4.0)
MCH: 29.3 pg (ref 26.0–34.0)
MCHC: 33.8 g/dL (ref 30.0–36.0)
MCV: 86.8 fL (ref 80.0–100.0)
Monocytes Absolute: 0.6 10*3/uL (ref 0.1–1.0)
Monocytes Relative: 10 %
Neutro Abs: 3 10*3/uL (ref 1.7–7.7)
Neutrophils Relative %: 49 %
Platelets: 216 10*3/uL (ref 150–400)
RBC: 5.22 MIL/uL (ref 4.22–5.81)
RDW: 14.9 % (ref 11.5–15.5)
WBC: 6.3 10*3/uL (ref 4.0–10.5)
nRBC: 0 % (ref 0.0–0.2)

## 2023-08-03 LAB — MAGNESIUM: Magnesium: 1.9 mg/dL (ref 1.7–2.4)

## 2023-08-03 LAB — BASIC METABOLIC PANEL WITH GFR
Anion gap: 10 (ref 5–15)
BUN: 13 mg/dL (ref 6–20)
CO2: 21 mmol/L — ABNORMAL LOW (ref 22–32)
Calcium: 8.5 mg/dL — ABNORMAL LOW (ref 8.9–10.3)
Chloride: 106 mmol/L (ref 98–111)
Creatinine, Ser: 1.62 mg/dL — ABNORMAL HIGH (ref 0.61–1.24)
GFR, Estimated: 50 mL/min — ABNORMAL LOW (ref >60)
Glucose, Bld: 241 mg/dL — ABNORMAL HIGH (ref 70–99)
Potassium: 4.1 mmol/L (ref 3.5–5.1)
Sodium: 137 mmol/L (ref 135–145)

## 2023-08-03 LAB — TROPONIN I (HIGH SENSITIVITY)
Troponin I (High Sensitivity): 10 ng/L (ref <18)
Troponin I (High Sensitivity): 9 ng/L (ref <18)

## 2023-08-03 LAB — RAPID URINE DRUG SCREEN, HOSP PERFORMED
Amphetamines: NOT DETECTED
Barbiturates: NOT DETECTED
Benzodiazepines: NOT DETECTED
Cocaine: NOT DETECTED
Opiates: NOT DETECTED
Tetrahydrocannabinol: POSITIVE — AB

## 2023-08-03 LAB — LIPASE, BLOOD: Lipase: 56 U/L — ABNORMAL HIGH (ref 11–51)

## 2023-08-03 LAB — BRAIN NATRIURETIC PEPTIDE: B Natriuretic Peptide: 50.9 pg/mL (ref 0.0–100.0)

## 2023-08-03 MED ORDER — ASPIRIN 81 MG PO TBEC
81.0000 mg | DELAYED_RELEASE_TABLET | Freq: Every day | ORAL | 0 refills | Status: AC
  Filled 2023-08-03: qty 120, 120d supply, fill #0

## 2023-08-03 NOTE — ED Notes (Signed)
 Patient discharged by RN. Patient verbalizes understanding of instructions. Ambulatory to lobby.

## 2023-08-03 NOTE — Discharge Instructions (Addendum)
 As discussed, your labs and imaging are reassuring.  I have sent a referral for cardiology, they will give you a call in several days to schedule an appointment.  Get help right away if: You have pain in your chest, neck, arm, jaw, or back, and the pain: Happens more often. Lasts more than a few minutes. Goes away and comes back. Does not get better after you take medicine under your tongue. You're dizzy or light-headed all of a sudden. You faint. You have any combination of these problems: Cold sweats. Heartburn or upset stomach. Trouble breathing. Feeling like you may throw up, or you throw up. Feeling very tired or weak. Feeling worried or nervous.

## 2023-08-03 NOTE — ED Triage Notes (Signed)
 Pt. Stated, Donald Wheeler had my heart racing feeling like its jumping out of my skin. My legs have been swelling, and sweating some. This started 2 weeks ago.

## 2023-08-03 NOTE — ED Provider Triage Note (Signed)
 Emergency Medicine Provider Triage Evaluation Note  Donald Wheeler , a 53 y.o. male  was evaluated in triage.  Pt complains of chest pain and palpitations.  Review of Systems  Positive: Chest pain, palpitations Negative: Shortness of breath, nausea, numbness, weakness  Physical Exam  BP 137/83 (BP Location: Right Arm)   Pulse (!) 45   Temp 98.4 F (36.9 C)   Resp 16   SpO2 97%  Gen:   Awake, no distress Resp:  Normal effort  MSK:   Moves extremities without difficulty   Medical Decision Making  Medically screening exam initiated at 9:55 AM.  Appropriate orders placed.  Donald Wheeler was informed that the remainder of the evaluation will be completed by another provider, this initial triage assessment does not replace that evaluation, and the importance of remaining in the ED until their evaluation is complete.  Patient presenting for intermittent chest pain and palpitations.  This has been ongoing for the past 2 weeks.  It does seem to occur every day.  When it occurs, he will typically last for couple hours.  He has never seen a cardiologist in the past.  He is currently asymptomatic.   Iva Mariner, MD 08/03/23 [phone]

## 2023-08-03 NOTE — ED Provider Notes (Signed)
 [place] [facility] Provider Note   CSN: [id number] Arrival date & time: 08/03/23  4098     History  Chief Complaint  Patient presents with   Palpitations   Excessive Sweating   Leg Swelling    Donald Wheeler is a 53 y.o. male with a history of HIV, stage III CKD, diabetes mellitus, and hypertension who presents the ED today for chest pain.  Reports intermittent episodes of substernal chest pain for the past 2 weeks with palpitations and shortness of breath.  States that this occurs mostly when walking at work.  Also reports he has been sweating more than normal.  Has been having intermittent episodes of leg swelling.  States that they are less swollen than before.  Denies any pain along the touch of the legs.  No fevers.    Home Medications Prior to Admission medications   Medication Sig Start Date End Date Taking? Authorizing Provider  amLODipine  (NORVASC ) 10 MG tablet Take 1 tablet (10 mg total) by mouth daily. 05/19/23   Liane Redman, MD  aspirin  EC (ASPIRIN  LOW DOSE) 81 MG tablet Take 1 tablet (81 mg total) by mouth daily. Swallow whole. 08/03/23 12/01/23  Sonnie Dusky, PA-C  atorvastatin  (LIPITOR ) 80 MG tablet Take 1 tablet (80 mg total) by mouth daily. 02/25/23   Jerrlyn Morel, NP  bictegravir-emtricitabine -tenofovir  AF (BIKTARVY ) 50-200-25 MG TABS tablet Take 1 tablet by mouth daily. 05/19/23   Liane Redman, MD  Blood Glucose Monitoring Suppl DEVI Use to check blood sugar twice daily. May substitute to any manufacturer covered by patient's insurance. 02/02/23   Jerrlyn Morel, NP  Glucose Blood (BLOOD GLUCOSE TEST STRIPS) STRP Use to check blood sugar twice daily. May substitute to any manufacturer covered by patient's insurance. Patient not taking: Reported on 05/28/2023 02/02/23   Jerrlyn Morel, NP  Lancet Device MISC Use to check blood sugar twice daily. May substitute to any manufacturer covered by patient's insurance. Patient not  taking: Reported on 05/28/2023 02/02/23   Jerrlyn Morel, NP  Lancets Misc. MISC May substitute to any manufacturer covered by patient's insurance. Patient not taking: Reported on 05/28/2023 02/02/23   Jerrlyn Morel, NP  lidocaine  (LIDODERM ) 5 % Place 1 patch onto the skin daily. Remove & Discard patch within 12 hours or as directed by MD 05/07/23   Carie Charity, PA-C  losartan  (COZAAR ) 50 MG tablet Take 1 tablet (50 mg total) by mouth daily. 02/06/23   Liane Redman, MD  losartan  (COZAAR ) 50 MG tablet Take 1 tablet (50 mg total) by mouth daily. 05/19/23   Liane Redman, MD  metFORMIN  (GLUCOPHAGE ) 1000 MG tablet Take 1 tablet (1,000 mg total) by mouth daily with breakfast. 02/02/23   Nichols, Tonya S, NP  sildenafil  (VIAGRA ) 25 MG tablet Take 1 tablet (25 mg total) by mouth daily as needed for erectile dysfunction. 05/28/23   Jerrlyn Morel, NP      Allergies    Patient has no known allergies.    Review of Systems   Review of Systems  Cardiovascular:  Positive for chest pain and palpitations.  All other systems reviewed and are negative.   Physical Exam Updated Vital Signs BP (!) 141/90   Pulse (!) 40   Temp 98 F (36.7 C) (Oral)   Resp 16   Ht 5\' 7"  (1.702 m)   Wt 85.7 kg   SpO2 100%   BMI 29.60 kg/m  Physical Exam Vitals and nursing note  reviewed.  Constitutional:      General: He is not in acute distress.    Appearance: Normal appearance.  HENT:     Head: Normocephalic and atraumatic.     Mouth/Throat:     Mouth: Mucous membranes are moist.  Eyes:     Conjunctiva/sclera: Conjunctivae normal.     Pupils: Pupils are equal, round, and reactive to light.  Cardiovascular:     Rate and Rhythm: Normal rate and regular rhythm.     Pulses: Normal pulses.     Heart sounds: Normal heart sounds.  Pulmonary:     Effort: Pulmonary effort is normal.     Breath sounds: Normal breath sounds.  Abdominal:     Palpations: Abdomen is soft.     Tenderness: There is no  abdominal tenderness.  Musculoskeletal:        General: No tenderness. Normal range of motion.     Cervical back: Normal range of motion.     Right lower leg: Edema present.     Left lower leg: Edema present.     Comments: Slight swelling bilateral ankles without pitting edema  Skin:    General: Skin is warm and dry.     Findings: No rash.  Neurological:     General: No focal deficit present.     Mental Status: He is alert.  Psychiatric:        Mood and Affect: Mood normal.        Behavior: Behavior normal.    ED Results / Procedures / Treatments   Labs (all labs ordered are listed, but only abnormal results are displayed) Labs Reviewed  LIPASE, BLOOD - Abnormal; Notable for the following components:      Result Value   Lipase 56 (*)    All other components within normal limits  BASIC METABOLIC PANEL WITH GFR - Abnormal; Notable for the following components:   CO2 21 (*)    Glucose, Bld 241 (*)    Creatinine, Ser 1.62 (*)    Calcium  8.5 (*)    GFR, Estimated 50 (*)    All other components within normal limits  RAPID URINE DRUG SCREEN, HOSP PERFORMED - Abnormal; Notable for the following components:   Tetrahydrocannabinol POSITIVE (*)    All other components within normal limits  MAGNESIUM  BRAIN NATRIURETIC PEPTIDE  CBC WITH DIFFERENTIAL/PLATELET  TROPONIN I (HIGH SENSITIVITY)  TROPONIN I (HIGH SENSITIVITY)    EKG EKG Interpretation Date/Time:  Tuesday Aug 03 2023 09:49:05 EDT Ventricular Rate:  47 PR Interval:  146 QRS Duration:  142 QT Interval:  442 QTC Calculation: 391 R Axis:   -7  Text Interpretation: Sinus bradycardia Right atrial enlargement Right bundle branch block Abnormal ECG When compared with ECG of 04-Aug-2019 09:51, No significant change since last tracing Confirmed by Racheal Buddle [phone]) on 08/03/2023 1:41:54 PM  Radiology DG Chest 1 View Result Date: 08/03/2023 CLINICAL DATA:  Chest pain. EXAM: CHEST  1 VIEW COMPARISON:  Chest radiograph  dated 02/11/2019. FINDINGS: The heart size and mediastinal contours are within normal limits. Both lungs are clear. The visualized skeletal structures are unremarkable. IMPRESSION: No active disease. Electronically Signed   By: Angus Bark M.D.   On: 08/03/2023 11:43    Procedures Procedures    Medications Ordered in ED Medications - No data to display  ED Course/ Medical Decision Making/ A&P  Medical Decision Making Risk OTC drugs.   This patient presents to the ED for concern of chest pain, this involves an extensive number of treatment options, and is a complaint that carries with it a high risk of complications and morbidity.   Differential diagnosis includes: ACS, cardiac arrhythmia, CHF, GERD, costochondritis, pleurisy, etc.   Comorbidities  See HPI above   Additional History  Additional history obtained from prior records   Cardiac Monitoring / EKG  The patient was maintained on a cardiac monitor.  I personally viewed and interpreted the cardiac monitored which showed: sinus bradycardia, right atrial enlargement, RBBB with a heart rate of 46 bpm.   Lab Tests  I ordered and personally interpreted labs.  The pertinent results include:   UDS positive for marijuana Initial troponin of 10, repeat 9 Magnesium was reassuring Lipase slightly elevated 56 BNP is unremarkable BMP and CBC within normal limits for patient   Imaging Studies  I ordered imaging studies including CXR  I independently visualized and interpreted imaging which showed:  No acute cardiopulmonary disease I agree with the radiologist interpretation   Problem List / ED Course / Critical Interventions / Medication Management  Patient's been having intermittent episodes of substernal chest pain and palpitations with exertion.  Symptoms resolved with rest.  He has never seen a cardiologist before.  Denies any symptoms at this time. Ambulated patient in the  room today.  Heart rate remained between 86 and 91 bpm. Oxygen remained at 100% room air. No chest pain, palpitations, or shortness of breath with this. Ambulatory referral for cardiology provided. Patient staffed with my attending, Dr. Randal Bury.   Social Determinants of Health  Access to healthcare   Test / Admission - Considered  Discussed right with patient.  All questions were answered. He is stable and safe for discharge home. Return precautions given.       Final Clinical Impression(s) / ED Diagnoses Final diagnoses:  Atypical chest pain    Rx / DC Orders ED Discharge Orders          Ordered    aspirin  EC (ASPIRIN  LOW DOSE) 81 MG tablet  Daily        08/03/23 1403    Ambulatory referral to Cardiology       Comments: If you have not heard from the Cardiology office within the next 72 hours please call [phone].   08/03/23 1413              Sonnie Dusky, PA-C 08/03/23 1455    Tonya Fredrickson, MD 08/03/23 1710

## 2023-08-03 NOTE — Telephone Encounter (Signed)
 Called Donald Wheeler to address his concerns, he states he's currently in the office meeting with the financial counselor.   He reports he's been sweating, not sleeping well, and that his heart has been racing. Also endorses chest pain that does not radiate for the last couple of weeks.   Discussed importance of urgent evaluation for potential cardiac event and recommended he go to the emergency department.   Went to discuss with [name] in person since he was in the clinic. He does not appear to be in any acute distress and is able to ambulate without assistance. He is not currently diaphoretic.  He states he will go across the street to the [facility] ED.  Avalynn Bowe, BSN, RN

## 2023-08-03 NOTE — Telephone Encounter (Signed)
 Donald Wheeler came in requesting to speak with Dr. Levern Reader. Pt did not want to disclose why but stated it was important he speak with her. I attempted to schedule pt for the next available with [facility] but pt declined. I also gave pt options to send a MyChart message or call to speak with a triage nurse. Pt requested an urgent call from Dr. Levern Reader at [phone].

## 2023-08-04 ENCOUNTER — Telehealth: Payer: Self-pay

## 2023-08-04 NOTE — Transitions of Care (Post Inpatient/ED Visit) (Unsigned)
 08/04/2023  Name: Donald Wheeler MRN: [id number] DOB: 1971/02/02  Today's TOC FU Call Status:   Patient's Name and Date of Birth confirmed.  Transition Care Management Follow-up Telephone Call Date of Discharge: 08/03/23 Discharge [facility] Type of Discharge: Emergency Department Reason for ED Visit: Cardiac Conditions Cardiac Conditions Diagnosis: Chest Pain Persisting How have you been since you were released from the hospital?: Same Any questions or concerns?: No  Items Reviewed: Did you receive and understand the discharge instructions provided?: Yes Medications obtained,verified, and reconciled?: Yes (Medications Reviewed) Any new allergies since your discharge?: No Dietary orders reviewed?: No Do you have support at home?: Yes People in Home [RPT]: alone  Medications Reviewed Today: Medications Reviewed Today     Reviewed by Angelita Bares, CMA (Certified Medical Assistant) on 08/04/23 at 1057  Med List Status: <None>   Medication Order Taking? Sig Documenting Provider Last Dose Status Informant  amLODipine  (NORVASC ) 10 MG tablet [id number] Yes Take 1 tablet (10 mg total) by mouth daily. Liane Redman, MD Taking Active   aspirin  EC (ASPIRIN  LOW DOSE) 81 MG tablet [id number] Yes Take 1 tablet (81 mg total) by mouth daily. Swallow whole. Sonnie Dusky, PA-C Taking Active   atorvastatin  (LIPITOR ) 80 MG tablet [id number] Yes Take 1 tablet (80 mg total) by mouth daily. Jerrlyn Morel, NP Taking Active   bictegravir-emtricitabine -tenofovir  AF (BIKTARVY ) 50-200-25 MG TABS tablet [id number] Yes Take 1 tablet by mouth daily. Liane Redman, MD Taking Active   Blood Glucose Monitoring Suppl DEVI [id number] Yes Use to check blood sugar twice daily. May substitute to any manufacturer covered by patient's insurance. Jerrlyn Morel, NP Taking Active   Glucose Blood (BLOOD GLUCOSE TEST STRIPS) STRP [id number]  Use to check blood sugar twice daily. May substitute to any  manufacturer covered by patient's insurance.  Patient not taking: Reported on 05/28/2023   Jerrlyn Morel, NP  Active   Lancet Device MISC [id number]  Use to check blood sugar twice daily. May substitute to any manufacturer covered by patient's insurance.  Patient not taking: Reported on 05/28/2023   Jerrlyn Morel, NP  Active   Lancets Misc. MISC [id number]  May substitute to any manufacturer covered by patient's insurance.  Patient not taking: Reported on 05/28/2023   Jerrlyn Morel, NP  Active   lidocaine  (LIDODERM ) 5 % [id number] Yes Place 1 patch onto the skin daily. Remove & Discard patch within 12 hours or as directed by MD Keith, Kayla N, PA-C Taking Active   losartan  (COZAAR ) 50 MG tablet [id number] Yes Take 1 tablet (50 mg total) by mouth daily. Liane Redman, MD Taking Active   losartan  (COZAAR ) 50 MG tablet [id number] Yes Take 1 tablet (50 mg total) by mouth daily. Liane Redman, MD Taking Active   metFORMIN  (GLUCOPHAGE ) 1000 MG tablet [id number] Yes Take 1 tablet (1,000 mg total) by mouth daily with breakfast. Jerrlyn Morel, NP Taking Active   sildenafil  (VIAGRA ) 25 MG tablet [id number] Yes Take 1 tablet (25 mg total) by mouth daily as needed for erectile dysfunction. Jerrlyn Morel, NP Taking Active             Home Care and Equipment/Supplies: Were Home Health Services Ordered?: No Any new equipment or medical supplies ordered?: No  Functional Questionnaire: Do you need assistance with bathing/showering or dressing?: No Do you need assistance with meal preparation?: No Do you need assistance with eating?: No Do you have difficulty maintaining continence: No Do you need assistance  with getting out of bed/getting out of a chair/moving?: No Do you have difficulty managing or taking your medications?: No  Follow up appointments reviewed: PCP Follow-up appointment confirmed?: Yes Date of PCP follow-up appointment?: 08/18/23 Follow-up Provider: Abbey Hobby Special[facility] Follow-up appointment confirmed?: NA Do you need transportation to your follow-up appointment?: No Do you understand care options if your condition(s) worsen?: Yes-patient verbalized understanding    SIGNATURE Lanisha Stepanian, RMA

## 2023-08-10 ENCOUNTER — Other Ambulatory Visit (HOSPITAL_COMMUNITY): Payer: Self-pay

## 2023-08-13 NOTE — Progress Notes (Signed)
 The ASCVD Risk score (Arnett DK, et al., 2019) failed to calculate for the following reasons:   Risk score cannot be calculated because patient has a medical history suggesting prior/existing ASCVD  Arlon Bergamo, BSN, RN

## 2023-08-18 ENCOUNTER — Encounter: Payer: Self-pay | Admitting: Nurse Practitioner

## 2023-08-18 ENCOUNTER — Other Ambulatory Visit (HOSPITAL_COMMUNITY): Payer: Self-pay

## 2023-08-18 ENCOUNTER — Ambulatory Visit (INDEPENDENT_AMBULATORY_CARE_PROVIDER_SITE_OTHER): Payer: Self-pay | Admitting: Nurse Practitioner

## 2023-08-18 ENCOUNTER — Other Ambulatory Visit: Payer: Self-pay

## 2023-08-18 VITALS — BP 136/81 | HR 67 | Temp 97.9°F | Wt 177.4 lb

## 2023-08-18 DIAGNOSIS — Z1211 Encounter for screening for malignant neoplasm of colon: Secondary | ICD-10-CM

## 2023-08-18 DIAGNOSIS — E118 Type 2 diabetes mellitus with unspecified complications: Secondary | ICD-10-CM | POA: Diagnosis not present

## 2023-08-18 MED ORDER — SILDENAFIL CITRATE 100 MG PO TABS
50.0000 mg | ORAL_TABLET | Freq: Every day | ORAL | 11 refills | Status: DC | PRN
  Filled 2023-08-18: qty 5, 5d supply, fill #0

## 2023-08-18 NOTE — Progress Notes (Signed)
 Subjective   Patient ID: Donald Wheeler, male    DOB: 30-Jul-1970, 53 y.o.   MRN: [id number]  Chief Complaint  Patient presents with   Hospitalization Follow-up    Referring provider: Jerrlyn Morel, NP  Lipa Knauff is a 53 y.o. male with Past Medical History: 09/07/2013: Bell's palsy 12/2017: Diabetes mellitus without complication (HCC)     Comment:  NEW ONSET  No date: HIV (human immunodeficiency virus infection) (HCC)     Comment:  dx'd 2000s No date: Hypertension 04/09/2017: Stroke [facility])     Comment:  vs TIA w/acute onset of right-sided weakness as well as               trouble speaking/notes 04/09/2017   HPI  Patient presents today for a follow-up visit.  Overall he has been stable.  He is asking for an increase in his dosage of Viagra .  We will check labs today. Denies f/c/s, n/v/d, hemoptysis, PND, leg swelling Denies chest pain or edema    No Known Allergies  Immunization History  Administered Date(s) Administered   Hepatitis A 02/22/2012   Hepatitis A, Adult 02/20/2013   Hepatitis B 02/22/2012, 03/17/2012   Influenza Split 03/17/2012   Influenza Whole 03/17/2019   Influenza,inj,Quad PF,6+ Mos 02/20/2013, 01/03/2014, 11/07/2018, 01/26/2022   Meningococcal Mcv4o 05/11/2016   PFIZER(Purple Top)SARS-COV-2 Vaccination 05/29/2019, 06/20/2019, 01/05/2020   PNEUMOCOCCAL CONJUGATE-20 07/22/2021   Pneumococcal Polysaccharide-23 03/17/2012   Td 02/22/2012   Tdap 02/22/2012    Tobacco History: Social History   Tobacco Use  Smoking Status Some Days   Current packs/day: 0.00   Average packs/day: 0.2 packs/day for 15.0 years (3.0 ttl pk-yrs)   Types: Cigars, Cigarettes   Start date: 12/13/2002   Last attempt to quit: 12/12/2017   Years since quitting: 5.6  Smokeless Tobacco Never  Tobacco Comments   black and milds daily   Ready to quit: Yes Counseling given: Yes Tobacco comments: black and milds daily   Outpatient Encounter Medications as of 08/18/2023   Medication Sig   amLODipine  (NORVASC ) 10 MG tablet Take 1 tablet (10 mg total) by mouth daily.   aspirin  EC (ASPIRIN  LOW DOSE) 81 MG tablet Take 1 tablet (81 mg total) by mouth daily. Swallow whole.   atorvastatin  (LIPITOR ) 80 MG tablet Take 1 tablet (80 mg total) by mouth daily.   bictegravir-emtricitabine -tenofovir  AF (BIKTARVY ) 50-200-25 MG TABS tablet Take 1 tablet by mouth daily.   Blood Glucose Monitoring Suppl DEVI Use to check blood sugar twice daily. May substitute to any manufacturer covered by patient's insurance.   Glucose Blood (BLOOD GLUCOSE TEST STRIPS) STRP Use to check blood sugar twice daily. May substitute to any manufacturer covered by patient's insurance.   lidocaine  (LIDODERM ) 5 % Place 1 patch onto the skin daily. Remove & Discard patch within 12 hours or as directed by MD   losartan  (COZAAR ) 50 MG tablet Take 1 tablet (50 mg total) by mouth daily.   losartan  (COZAAR ) 50 MG tablet Take 1 tablet (50 mg total) by mouth daily.   metFORMIN  (GLUCOPHAGE ) 1000 MG tablet Take 1 tablet (1,000 mg total) by mouth daily with breakfast.   sildenafil  (VIAGRA ) 100 MG tablet Take 0.5-1 tablets (50-100 mg total) by mouth daily as needed for erectile dysfunction.   [DISCONTINUED] sildenafil  (VIAGRA ) 25 MG tablet Take 1 tablet (25 mg total) by mouth daily as needed for erectile dysfunction.   Lancet Device MISC Use to check blood sugar twice daily. May substitute to any manufacturer covered  by patient's insurance. (Patient not taking: Reported on 05/28/2023)   Lancets Misc. MISC May substitute to any manufacturer covered by patient's insurance. (Patient not taking: Reported on 05/28/2023)   No facility-administered encounter medications on file as of 08/18/2023.    Review of Systems  Review of Systems  Constitutional: Negative.   HENT: Negative.    Cardiovascular: Negative.   Gastrointestinal: Negative.   Allergic/Immunologic: Negative.   Neurological: Negative.    Psychiatric/Behavioral: Negative.       Objective:   BP 136/81   Pulse 67   Temp 97.9 F (36.6 C) (Oral)   Wt 177 lb 6.4 oz (80.5 kg)   SpO2 100%   BMI 27.78 kg/m   Wt Readings from Last 5 Encounters:  08/18/23 177 lb 6.4 oz (80.5 kg)  08/03/23 189 lb (85.7 kg)  05/28/23 180 lb 12.8 oz (82 kg)  05/19/23 180 lb (81.6 kg)  05/08/23 180 lb (81.6 kg)     Physical Exam Vitals and nursing note reviewed.  Constitutional:      General: He is not in acute distress.    Appearance: He is well-developed.  Cardiovascular:     Rate and Rhythm: Normal rate and regular rhythm.  Pulmonary:     Effort: Pulmonary effort is normal.     Breath sounds: Normal breath sounds.  Skin:    General: Skin is warm and dry.  Neurological:     Mental Status: He is alert and oriented to person, place, and time.       Assessment & Plan:   Screen for colon cancer -     Cologuard  DM type 2, controlled, with complication (HCC) -     Basic metabolic panel with GFR -     CBC  Other orders -     Sildenafil  Citrate; Take 0.5-1 tablets (50-100 mg total) by mouth daily as needed for erectile dysfunction.  Dispense: 5 tablet; Refill: 11     Return in about 3 months (around 11/18/2023).   Jerrlyn Morel, NP 08/18/2023

## 2023-08-18 NOTE — Patient Instructions (Signed)
 1. Screen for colon cancer (Primary)  - Cologuard  2. DM type 2, controlled, with complication (HCC)  - Basic Metabolic Panel - CBC

## 2023-08-19 ENCOUNTER — Ambulatory Visit: Payer: Self-pay | Admitting: Nurse Practitioner

## 2023-08-19 ENCOUNTER — Other Ambulatory Visit: Payer: Self-pay | Admitting: Nurse Practitioner

## 2023-08-19 DIAGNOSIS — N289 Disorder of kidney and ureter, unspecified: Secondary | ICD-10-CM

## 2023-08-19 LAB — BASIC METABOLIC PANEL WITH GFR
BUN/Creatinine Ratio: 9 (ref 9–20)
BUN: 15 mg/dL (ref 6–24)
CO2: 22 mmol/L (ref 20–29)
Calcium: 9.3 mg/dL (ref 8.7–10.2)
Chloride: 104 mmol/L (ref 96–106)
Creatinine, Ser: 1.71 mg/dL — ABNORMAL HIGH (ref 0.76–1.27)
Glucose: 104 mg/dL — ABNORMAL HIGH (ref 70–99)
Potassium: 4.6 mmol/L (ref 3.5–5.2)
Sodium: 142 mmol/L (ref 134–144)
eGFR: 47 mL/min/{1.73_m2} — ABNORMAL LOW (ref >59)

## 2023-08-19 LAB — CBC
Hematocrit: 51.3 % — ABNORMAL HIGH (ref 37.5–51.0)
Hemoglobin: 17.2 g/dL (ref 13.0–17.7)
MCH: 30.5 pg (ref 26.6–33.0)
MCHC: 33.5 g/dL (ref 31.5–35.7)
MCV: 91 fL (ref 79–97)
Platelets: 190 10*3/uL (ref 150–450)
RBC: 5.64 x10E6/uL (ref 4.14–5.80)
RDW: 15.6 % — ABNORMAL HIGH (ref 11.6–15.4)
WBC: 6.4 10*3/uL (ref 3.4–10.8)

## 2023-08-20 ENCOUNTER — Other Ambulatory Visit: Payer: Self-pay

## 2023-08-20 NOTE — Progress Notes (Signed)
 Specialty Pharmacy Refill Coordination Note  Donald Wheeler is a 53 y.o. male contacted today regarding refills of specialty medication(s) Bictegravir-Emtricitab-Tenofov (Biktarvy )   Patient requested [name] at [facility] [place] date: 08/23/23   Medication will be filled on 06.06.25.

## 2023-08-23 ENCOUNTER — Telehealth: Payer: Self-pay | Admitting: Nurse Practitioner

## 2023-08-23 ENCOUNTER — Ambulatory Visit: Payer: Self-pay

## 2023-08-23 DIAGNOSIS — Z1211 Encounter for screening for malignant neoplasm of colon: Secondary | ICD-10-CM | POA: Diagnosis not present

## 2023-08-23 NOTE — Telephone Encounter (Signed)
 FYI Only or Action Required?: Action required by provider  Patient was last seen in primary care on 08/18/2023 by Jerrlyn Morel, NP. Called Nurse Triage reporting question. Symptoms began today. Interventions attempted: Nothing. Symptoms are: stable.  Triage Disposition: No disposition on file.  Patient/caregiver understands and will follow disposition?:   Reason for Disposition  Nursing judgment or information in reference  Answer Assessment - Initial Assessment Questions 1. REASON FOR CALL: "What is your main concern right now?"     States received cologuard kit but does not know how to do it.  This nurse asked him to read the directions, patient states "it's a lot".  States there is so much stuff in it.  Would like a call back from the office.  Protocols used: No Guideline Available-A-AH

## 2023-08-23 NOTE — Telephone Encounter (Signed)
 Copied from CRM [id number]. Topic: Appointments - Scheduling Inquiry for Clinic >> Aug 23, 2023  9:48 AM Corin V wrote: Reason for CRM: Patient is wanting to come into the office to complete his cologuard test with assistance from staff as he does not understand how to complete the test himself. Please advise if that is something that can be done in office. If not, please call patient back to go through instructions with him so he can complete at home.

## 2023-08-25 ENCOUNTER — Other Ambulatory Visit (HOSPITAL_COMMUNITY): Payer: Self-pay

## 2023-08-28 LAB — COLOGUARD: COLOGUARD: NEGATIVE

## 2023-08-30 ENCOUNTER — Other Ambulatory Visit (HOSPITAL_COMMUNITY): Payer: Self-pay

## 2023-09-07 ENCOUNTER — Other Ambulatory Visit (HOSPITAL_COMMUNITY): Payer: Self-pay

## 2023-09-07 ENCOUNTER — Other Ambulatory Visit: Payer: Self-pay | Admitting: Nurse Practitioner

## 2023-09-07 ENCOUNTER — Other Ambulatory Visit: Payer: Self-pay

## 2023-09-07 DIAGNOSIS — E1165 Type 2 diabetes mellitus with hyperglycemia: Secondary | ICD-10-CM

## 2023-09-08 ENCOUNTER — Other Ambulatory Visit (HOSPITAL_COMMUNITY): Payer: Self-pay

## 2023-09-08 ENCOUNTER — Other Ambulatory Visit: Payer: Self-pay

## 2023-09-08 MED ORDER — METFORMIN HCL 1000 MG PO TABS
1000.0000 mg | ORAL_TABLET | Freq: Two times a day (BID) | ORAL | 3 refills | Status: AC
  Filled 2023-09-08: qty 180, 90d supply, fill #0
  Filled 2024-02-14: qty 180, 90d supply, fill #1

## 2023-09-15 ENCOUNTER — Other Ambulatory Visit (HOSPITAL_COMMUNITY): Payer: Self-pay

## 2023-09-15 DIAGNOSIS — N1831 Chronic kidney disease, stage 3a: Secondary | ICD-10-CM | POA: Diagnosis not present

## 2023-09-15 DIAGNOSIS — R809 Proteinuria, unspecified: Secondary | ICD-10-CM | POA: Diagnosis not present

## 2023-09-15 DIAGNOSIS — I129 Hypertensive chronic kidney disease with stage 1 through stage 4 chronic kidney disease, or unspecified chronic kidney disease: Secondary | ICD-10-CM | POA: Diagnosis not present

## 2023-09-15 MED ORDER — LOSARTAN POTASSIUM 100 MG PO TABS
100.0000 mg | ORAL_TABLET | Freq: Every day | ORAL | 6 refills | Status: DC
  Filled 2023-09-15: qty 30, 30d supply, fill #0
  Filled 2023-10-23: qty 30, 30d supply, fill #1

## 2023-09-16 ENCOUNTER — Other Ambulatory Visit: Payer: Self-pay

## 2023-09-16 ENCOUNTER — Other Ambulatory Visit (HOSPITAL_COMMUNITY): Payer: Self-pay

## 2023-09-16 DIAGNOSIS — N1831 Chronic kidney disease, stage 3a: Secondary | ICD-10-CM

## 2023-09-16 MED ORDER — LOSARTAN POTASSIUM 100 MG PO TABS
100.0000 mg | ORAL_TABLET | Freq: Every day | ORAL | 6 refills | Status: AC
  Filled 2023-09-16 – 2023-12-01 (×2): qty 30, 30d supply, fill #0
  Filled 2024-01-10: qty 30, 30d supply, fill #1
  Filled 2024-02-14: qty 30, 30d supply, fill #2
  Filled 2024-03-15: qty 30, 30d supply, fill #3

## 2023-09-16 NOTE — Progress Notes (Signed)
 Specialty Pharmacy Refill Coordination Note  Spoke with Matthewjames Petrasek  Berdell Hostetler is a 53 y.o. male contacted today regarding refills of specialty medication(s) Bictegravir-Emtricitab-Tenofov (Biktarvy )  Patient requested: Delivery   Pickup date: 09/21/23  Medication will be filled on 09/20/23.

## 2023-09-20 ENCOUNTER — Ambulatory Visit
Admission: RE | Admit: 2023-09-20 | Discharge: 2023-09-20 | Disposition: A | Discharge status: Home / Self Care | Source: Ambulatory Visit

## 2023-09-20 ENCOUNTER — Other Ambulatory Visit (HOSPITAL_COMMUNITY): Payer: Self-pay

## 2023-09-20 DIAGNOSIS — N1831 Chronic kidney disease, stage 3a: Secondary | ICD-10-CM

## 2023-09-23 ENCOUNTER — Other Ambulatory Visit (HOSPITAL_COMMUNITY): Payer: Self-pay

## 2023-09-27 ENCOUNTER — Ambulatory Visit: Admitting: Internal Medicine

## 2023-10-01 DIAGNOSIS — I129 Hypertensive chronic kidney disease with stage 1 through stage 4 chronic kidney disease, or unspecified chronic kidney disease: Secondary | ICD-10-CM | POA: Diagnosis not present

## 2023-10-01 DIAGNOSIS — N1831 Chronic kidney disease, stage 3a: Secondary | ICD-10-CM | POA: Diagnosis not present

## 2023-10-15 ENCOUNTER — Other Ambulatory Visit (HOSPITAL_COMMUNITY): Payer: Self-pay

## 2023-10-27 ENCOUNTER — Other Ambulatory Visit (HOSPITAL_COMMUNITY): Payer: Self-pay

## 2023-11-12 ENCOUNTER — Other Ambulatory Visit: Payer: Self-pay

## 2023-11-12 NOTE — Progress Notes (Signed)
 Specialty Pharmacy Refill Coordination Note  Donald Wheeler is a 53 y.o. male contacted today regarding refills of specialty medication(s) Bictegravir-Emtricitab-Tenofov (Biktarvy )   Patient requested Delivery   Pickup date: 11/12/23   Medication will be filled on 11/12/23.

## 2023-11-18 ENCOUNTER — Ambulatory Visit: Payer: Self-pay | Admitting: Nurse Practitioner

## 2023-11-26 ENCOUNTER — Other Ambulatory Visit (HOSPITAL_COMMUNITY): Payer: Self-pay

## 2023-12-01 ENCOUNTER — Other Ambulatory Visit (HOSPITAL_COMMUNITY): Payer: Self-pay

## 2023-12-01 ENCOUNTER — Other Ambulatory Visit: Payer: Self-pay

## 2023-12-02 ENCOUNTER — Encounter: Payer: Self-pay | Admitting: Nurse Practitioner

## 2023-12-02 ENCOUNTER — Other Ambulatory Visit (HOSPITAL_COMMUNITY): Payer: Self-pay

## 2023-12-02 ENCOUNTER — Telehealth: Payer: Self-pay | Admitting: Nurse Practitioner

## 2023-12-02 ENCOUNTER — Ambulatory Visit (INDEPENDENT_AMBULATORY_CARE_PROVIDER_SITE_OTHER): Payer: Self-pay | Admitting: Nurse Practitioner

## 2023-12-02 VITALS — BP 131/88 | HR 72 | Temp 97.9°F | Wt 184.4 lb

## 2023-12-02 DIAGNOSIS — N529 Male erectile dysfunction, unspecified: Secondary | ICD-10-CM | POA: Diagnosis not present

## 2023-12-02 DIAGNOSIS — E118 Type 2 diabetes mellitus with unspecified complications: Secondary | ICD-10-CM

## 2023-12-02 LAB — POCT GLYCOSYLATED HEMOGLOBIN (HGB A1C): Hemoglobin A1C: 6.9 % — AB (ref 4.0–5.6)

## 2023-12-02 MED ORDER — ONETOUCH ULTRA TEST VI STRP
ORAL_STRIP | Freq: Two times a day (BID) | 3 refills | Status: AC
  Filled 2023-12-02 – 2023-12-03 (×2): qty 100, 50d supply, fill #0

## 2023-12-02 MED ORDER — BLOOD GLUCOSE METER KIT
PACK | 0 refills | Status: AC
  Filled 2023-12-02 – 2023-12-03 (×2): qty 1, 30d supply, fill #0

## 2023-12-02 NOTE — Progress Notes (Signed)
 Subjective   Patient ID: Donald Wheeler, male    DOB: 17-Mar-1970, 53 y.o.   MRN: [id number]  Chief Complaint  Patient presents with   Medical Management of Chronic Issues   Diabetes    Referring provider: Oley Bascom RAMAN, NP  Donald Wheeler is a 53 y.o. male with Past Medical History: 09/07/2013: Bell's palsy 12/2017: Diabetes mellitus without complication (HCC)     Comment:  NEW ONSET  No date: HIV (human immunodeficiency virus infection) (HCC)     Comment:  dx'd 2000s No date: Hypertension 04/09/2017: Stroke [facility])     Comment:  vs TIA w/acute onset of right-sided weakness as well as               trouble speaking/notes 04/09/2017   Diabetes    Saw nephrology  Patient presents today for a follow-up visit.  Overall he has been stable.  He has followed with nephrology since last visit. He is asking for an increase in his dosage of Viagra .  He is already on 100 mg so we will refer him to urology for further evaluation and treatment for erectile dysfunction.  Denies f/c/s, n/v/d, hemoptysis, PND, leg swelling. Denies chest pain or edema.    No Known Allergies  Immunization History  Administered Date(s) Administered   Hepatitis A 02/22/2012   Hepatitis A, Adult 02/20/2013   Hepatitis B 02/22/2012, 03/17/2012   Influenza Split 03/17/2012   Influenza Whole 03/17/2019   Influenza,inj,Quad PF,6+ Mos 02/20/2013, 01/03/2014, 11/07/2018, 01/26/2022   Meningococcal Mcv4o 05/11/2016   PFIZER(Purple Top)SARS-COV-2 Vaccination 05/29/2019, 06/20/2019, 01/05/2020   PNEUMOCOCCAL CONJUGATE-20 07/22/2021   Pneumococcal Polysaccharide-23 03/17/2012   Td 02/22/2012   Tdap 02/22/2012    Tobacco History: Social History   Tobacco Use  Smoking Status Some Days   Current packs/day: 0.00   Average packs/day: 0.2 packs/day for 15.0 years (3.0 ttl pk-yrs)   Types: Cigars, Cigarettes   Start date: 12/13/2002   Last attempt to quit: 12/12/2017   Years since quitting: 5.9  Smokeless  Tobacco Never  Tobacco Comments   black and milds daily   Ready to quit: Not Answered Counseling given: Yes Tobacco comments: black and milds daily   Outpatient Encounter Medications as of 12/02/2023  Medication Sig   amLODipine  (NORVASC ) 10 MG tablet Take 1 tablet (10 mg total) by mouth daily.   atorvastatin  (LIPITOR ) 80 MG tablet Take 1 tablet (80 mg total) by mouth daily.   bictegravir-emtricitabine -tenofovir  AF (BIKTARVY ) 50-200-25 MG TABS tablet Take 1 tablet by mouth daily.   lidocaine  (LIDODERM ) 5 % Place 1 patch onto the skin daily. Remove & Discard patch within 12 hours or as directed by MD   losartan  (COZAAR ) 100 MG tablet Take 1 tablet (100 mg total) by mouth daily.   metFORMIN  (GLUCOPHAGE ) 1000 MG tablet Take 1 tablet (1,000 mg total) by mouth daily with breakfast.   sildenafil  (VIAGRA ) 100 MG tablet Take 0.5-1 tablets (50-100 mg total) by mouth daily as needed for erectile dysfunction.   Blood Glucose Monitoring Suppl DEVI Use to check blood sugar twice daily. May substitute to any manufacturer covered by patient's insurance.   Glucose Blood (BLOOD GLUCOSE TEST STRIPS) STRP Use to check blood sugar twice daily. May substitute to any manufacturer covered by patient's insurance.   Lancet Device MISC Use to check blood sugar twice daily. May substitute to any manufacturer covered by patient's insurance. (Patient not taking: Reported on 05/28/2023)   Lancets Misc. MISC May substitute to any manufacturer covered by patient's  insurance. (Patient not taking: Reported on 05/28/2023)   losartan  (COZAAR ) 100 MG tablet Take 1 tablet (100 mg total) by mouth daily. (Patient not taking: Reported on 12/02/2023)   losartan  (COZAAR ) 50 MG tablet Take 1 tablet (50 mg total) by mouth daily. (Patient not taking: Reported on 12/02/2023)   losartan  (COZAAR ) 50 MG tablet Take 1 tablet (50 mg total) by mouth daily. (Patient not taking: Reported on 12/02/2023)   metFORMIN  (GLUCOPHAGE ) 1000 MG tablet Take 1  tablet (1,000 mg total) by mouth 2 (two) times daily with a meal.   [DISCONTINUED] Blood Glucose Monitoring Suppl DEVI Use to check blood sugar twice daily. May substitute to any manufacturer covered by patient's insurance. (Patient not taking: Reported on 12/02/2023)   [DISCONTINUED] Glucose Blood (BLOOD GLUCOSE TEST STRIPS) STRP Use to check blood sugar twice daily. May substitute to any manufacturer covered by patient's insurance. (Patient not taking: Reported on 12/02/2023)   No facility-administered encounter medications on file as of 12/02/2023.    Review of Systems  Review of Systems  Constitutional: Negative.   HENT: Negative.    Cardiovascular: Negative.   Gastrointestinal: Negative.   Allergic/Immunologic: Negative.   Neurological: Negative.   Psychiatric/Behavioral: Negative.       Objective:   BP 131/88   Pulse 72   Temp 97.9 F (36.6 C) (Oral)   Wt 184 lb 6.4 oz (83.6 kg)   SpO2 100%   BMI 28.88 kg/m   Wt Readings from Last 5 Encounters:  12/02/23 184 lb 6.4 oz (83.6 kg)  08/18/23 177 lb 6.4 oz (80.5 kg)  08/03/23 189 lb (85.7 kg)  05/28/23 180 lb 12.8 oz (82 kg)  05/19/23 180 lb (81.6 kg)     Physical Exam Vitals and nursing note reviewed.  Constitutional:      General: He is not in acute distress.    Appearance: He is well-developed.  Cardiovascular:     Rate and Rhythm: Normal rate and regular rhythm.  Pulmonary:     Effort: Pulmonary effort is normal.     Breath sounds: Normal breath sounds.  Skin:    General: Skin is warm and dry.  Neurological:     Mental Status: He is alert and oriented to person, place, and time.       Assessment & Plan:   DM type 2, controlled, with complication (HCC) -     POCT glycosylated hemoglobin (Hb A1C)  Erectile dysfunction, unspecified erectile dysfunction type -     Ambulatory referral to Urology  Other orders -     Blood Glucose Monitoring Suppl; Use to check blood sugar twice daily. May substitute to  any manufacturer covered by patient's insurance.  Dispense: 1 each; Refill: 0 -     Blood Glucose Test; Use to check blood sugar twice daily. May substitute to any manufacturer covered by patient's insurance.  Dispense: 100 strip; Refill: 3     Return in about 3 months (around 03/02/2024).   Bascom GORMAN Borer, NP 12/02/2023

## 2023-12-02 NOTE — Telephone Encounter (Signed)
 Copied from CRM [id number]. Topic: Appointments - Appointment Info/Confirmation >> Dec 01, 2023  2:18 PM Donald Wheeler wrote: Patient/patient representative is calling for information regarding an appointment.  Patient confirmed appointment tomorrow

## 2023-12-03 ENCOUNTER — Other Ambulatory Visit (HOSPITAL_COMMUNITY): Payer: Self-pay

## 2023-12-03 ENCOUNTER — Other Ambulatory Visit: Payer: Self-pay

## 2023-12-03 MED ORDER — FREESTYLE LANCETS MISC
3 refills | Status: AC
  Filled 2023-12-03: qty 100, 50d supply, fill #0

## 2023-12-09 ENCOUNTER — Other Ambulatory Visit (HOSPITAL_COMMUNITY): Payer: Self-pay

## 2023-12-10 ENCOUNTER — Other Ambulatory Visit (HOSPITAL_COMMUNITY): Payer: Self-pay

## 2023-12-13 ENCOUNTER — Ambulatory Visit (INDEPENDENT_AMBULATORY_CARE_PROVIDER_SITE_OTHER): Payer: Self-pay | Admitting: Nurse Practitioner

## 2023-12-13 ENCOUNTER — Other Ambulatory Visit (HOSPITAL_COMMUNITY): Payer: Self-pay

## 2023-12-13 ENCOUNTER — Encounter: Payer: Self-pay | Admitting: Nurse Practitioner

## 2023-12-13 VITALS — BP 154/84 | HR 63 | Temp 97.8°F | Ht 67.0 in | Wt 186.0 lb

## 2023-12-13 DIAGNOSIS — H547 Unspecified visual loss: Secondary | ICD-10-CM

## 2023-12-13 MED ORDER — SILDENAFIL CITRATE 100 MG PO TABS
50.0000 mg | ORAL_TABLET | Freq: Every day | ORAL | 11 refills | Status: AC | PRN
  Filled 2023-12-13: qty 5, 5d supply, fill #0
  Filled 2024-01-19: qty 5, 5d supply, fill #1
  Filled 2024-01-19: qty 5, 5d supply, fill #0
  Filled 2024-01-20: qty 10, 10d supply, fill #0
  Filled 2024-02-17: qty 10, 10d supply, fill #1
  Filled 2024-03-15: qty 10, 10d supply, fill #2

## 2023-12-13 NOTE — Patient Instructions (Signed)
 [facility] (accepts Medicaid, Medicare, [employer], and/or Self-Pay) [facility]  [address], [address],  [place], [place] [address] [phone]  [facility]  [address] [place], [place] [address] [phone]  [facility]  [facility] [place] [address] [place], [place] [address] *Located next to [facility]  [phone]  [facility]  [address] [place], [place] [address]  *Located next to [employer] [phone]  [facility]  [address] [place], [place] [address] [phone]  [facility]  [address] [place], [place] [address]  [phone] *Located inside [facility]  [address] [place], [place] [address] [phone]  [facility] [address] [place], [place] [address] [phone] [address] [place], [place] [address] [phone]  [facility] [address] [place], [place] [address] [phone]  [facility]  [address] [place], [place] [address] [phone]  [facility]  [address] [place], [place] [address] [phone]

## 2023-12-13 NOTE — Progress Notes (Signed)
 Subjective   Patient ID: Donald Wheeler, male    DOB: 07-13-1970, 53 y.o.   MRN: [id number]  Chief Complaint  Patient presents with   Diabetes    DM f/u.  No questions / concerns     Referring provider: Oley Bascom RAMAN, NP  Donald Wheeler is a 53 y.o. male with Past Medical History: 09/07/2013: Bell's palsy 12/2017: Diabetes mellitus without complication (HCC)     Comment:  NEW ONSET  No date: HIV (human immunodeficiency virus infection) (HCC)     Comment:  dx'd 2000s No date: Hypertension 04/09/2017: Stroke [facility])     Comment:  vs TIA w/acute onset of right-sided weakness as well as               trouble speaking/notes 04/09/2017  HPI   Patient presents today for follow-up visit.  He recently had A1c checked in office.  Overall has been stable.  He does need a refill of Viagra  today.  He does need a referral to optometry today. Denies f/c/s, n/v/d, hemoptysis, PND, leg swelling Denies chest pain or edema    No Known Allergies  Immunization History  Administered Date(s) Administered   Hepatitis A 02/22/2012   Hepatitis A, Adult 02/20/2013   Hepatitis B 02/22/2012, 03/17/2012   Influenza Split 03/17/2012   Influenza Whole 03/17/2019   Influenza,inj,Quad PF,6+ Mos 02/20/2013, 01/03/2014, 11/07/2018, 01/26/2022   Meningococcal Mcv4o 05/11/2016   PFIZER(Purple Top)SARS-COV-2 Vaccination 05/29/2019, 06/20/2019, 01/05/2020   PNEUMOCOCCAL CONJUGATE-20 07/22/2021   Pneumococcal Polysaccharide-23 03/17/2012   Td 02/22/2012   Tdap 02/22/2012    Tobacco History: Social History   Tobacco Use  Smoking Status Some Days   Current packs/day: 0.00   Average packs/day: 0.2 packs/day for 15.0 years (3.0 ttl pk-yrs)   Types: Cigars, Cigarettes   Start date: 12/13/2002   Last attempt to quit: 12/12/2017   Years since quitting: 6.0  Smokeless Tobacco Never  Tobacco Comments   black and milds daily   Ready to quit: Not Answered Counseling given: Not Answered Tobacco comments:  black and milds daily   Outpatient Encounter Medications as of 12/13/2023  Medication Sig   amLODipine  (NORVASC ) 10 MG tablet Take 1 tablet (10 mg total) by mouth daily.   atorvastatin  (LIPITOR ) 80 MG tablet Take 1 tablet (80 mg total) by mouth daily.   bictegravir-emtricitabine -tenofovir  AF (BIKTARVY ) 50-200-25 MG TABS tablet Take 1 tablet by mouth daily.   blood glucose meter kit and supplies use twice daily   glucose blood (ONETOUCH ULTRA TEST) test strip Use to test blood sugar twice daily as directed   Lancet Device MISC Use to check blood sugar twice daily. May substitute to any manufacturer covered by patient's insurance.   Lancets (FREESTYLE) lancets Use as directed to test blood sugar twice daily   Lancets Misc. MISC May substitute to any manufacturer covered by patient's insurance.   lidocaine  (LIDODERM ) 5 % Place 1 patch onto the skin daily. Remove & Discard patch within 12 hours or as directed by MD   losartan  (COZAAR ) 100 MG tablet Take 1 tablet (100 mg total) by mouth daily.   metFORMIN  (GLUCOPHAGE ) 1000 MG tablet Take 1 tablet (1,000 mg total) by mouth daily with breakfast.   metFORMIN  (GLUCOPHAGE ) 1000 MG tablet Take 1 tablet (1,000 mg total) by mouth 2 (two) times daily with a meal.   [DISCONTINUED] sildenafil  (VIAGRA ) 100 MG tablet Take 0.5-1 tablets (50-100 mg total) by mouth daily as needed for erectile dysfunction.   losartan  (COZAAR ) 100 MG  tablet Take 1 tablet (100 mg total) by mouth daily. (Patient not taking: Reported on 12/13/2023)   losartan  (COZAAR ) 50 MG tablet Take 1 tablet (50 mg total) by mouth daily. (Patient not taking: Reported on 12/13/2023)   losartan  (COZAAR ) 50 MG tablet Take 1 tablet (50 mg total) by mouth daily. (Patient not taking: Reported on 12/13/2023)   sildenafil  (VIAGRA ) 100 MG tablet Take 0.5-1 tablets (50-100 mg total) by mouth daily as needed for erectile dysfunction.   No facility-administered encounter medications on file as of 12/13/2023.     Review of Systems  Review of Systems  Constitutional: Negative.   HENT: Negative.    Cardiovascular: Negative.   Gastrointestinal: Negative.   Allergic/Immunologic: Negative.   Neurological: Negative.   Psychiatric/Behavioral: Negative.       Objective:   BP (!) 154/84 (BP Location: Left Arm, Patient Position: Sitting, Cuff Size: Normal)   Pulse 63   Temp 97.8 F (36.6 C) (Oral)   Ht 5' 7 (1.702 m)   Wt 186 lb (84.4 kg)   SpO2 100%   BMI 29.13 kg/m   Wt Readings from Last 5 Encounters:  12/13/23 186 lb (84.4 kg)  12/02/23 184 lb 6.4 oz (83.6 kg)  08/18/23 177 lb 6.4 oz (80.5 kg)  08/03/23 189 lb (85.7 kg)  05/28/23 180 lb 12.8 oz (82 kg)     Physical Exam Vitals and nursing note reviewed.  Constitutional:      General: He is not in acute distress.    Appearance: He is well-developed.  Cardiovascular:     Rate and Rhythm: Normal rate and regular rhythm.  Pulmonary:     Effort: Pulmonary effort is normal.     Breath sounds: Normal breath sounds.  Skin:    General: Skin is warm and dry.  Neurological:     Mental Status: He is alert and oriented to person, place, and time.       Assessment & Plan:   Decreased vision -     Ambulatory referral to Optometry  Other orders -     Sildenafil  Citrate; Take 0.5-1 tablets (50-100 mg total) by mouth daily as needed for erectile dysfunction.  Dispense: 5 tablet; Refill: 11     Return in about 3 months (around 03/13/2024).   Bascom GORMAN Borer, NP 12/13/2023

## 2023-12-14 ENCOUNTER — Other Ambulatory Visit: Payer: Self-pay

## 2023-12-14 NOTE — Progress Notes (Signed)
 Specialty Pharmacy Refill Coordination Note  Donald Wheeler is a 53 y.o. male contacted today regarding refills of specialty medication(s) Bictegravir-Emtricitab-Tenofov (Biktarvy )   Patient requested Delivery   Pickup date: 12/16/23   Medication will be filled on 10.01.25.

## 2023-12-15 ENCOUNTER — Other Ambulatory Visit: Payer: Self-pay

## 2023-12-20 ENCOUNTER — Other Ambulatory Visit: Payer: Self-pay

## 2023-12-20 NOTE — Progress Notes (Signed)
 Specialty Pharmacy Ongoing Clinical Assessment Note  Donald Wheeler is a 53 y.o. male who is being followed by the specialty pharmacy service for RxSp HIV   Patient's specialty medication(s) reviewed today: Bictegravir-Emtricitab-Tenofov (Biktarvy )   Missed doses in the last 4 weeks: 2 (forgot)   Patient/Caregiver did not have any additional questions or concerns.   Therapeutic benefit summary: Patient is achieving benefit   Adverse events/side effects summary: No adverse events/side effects   Patient's therapy is appropriate to: Continue    Goals Addressed             This Visit's Progress    Achieve Undetectable HIV Viral Load < 20   On track    Patient is on track. Patient will work on increased adherence.  Last viral load was <20 copies/mL on 04/08/23.         Follow up: 12 months  Silvano LOISE Dolly Specialty Pharmacist

## 2023-12-24 ENCOUNTER — Other Ambulatory Visit (HOSPITAL_COMMUNITY): Payer: Self-pay

## 2024-01-10 ENCOUNTER — Other Ambulatory Visit (HOSPITAL_COMMUNITY): Payer: Self-pay

## 2024-01-14 ENCOUNTER — Other Ambulatory Visit (HOSPITAL_COMMUNITY): Payer: Self-pay

## 2024-01-18 ENCOUNTER — Other Ambulatory Visit: Payer: Self-pay

## 2024-01-18 NOTE — Progress Notes (Signed)
 Specialty Pharmacy Refill Coordination Note  Donald Wheeler is a 53 y.o. male contacted today regarding refills of specialty medication(s) Bictegravir-Emtricitab-Tenofov (Biktarvy )   Patient requested [name] at [facility] [place] date: 01/21/24   Medication will be filled on: 01/20/24

## 2024-01-19 ENCOUNTER — Other Ambulatory Visit (HOSPITAL_COMMUNITY): Payer: Self-pay

## 2024-01-19 ENCOUNTER — Other Ambulatory Visit: Payer: Self-pay

## 2024-01-20 ENCOUNTER — Other Ambulatory Visit (HOSPITAL_COMMUNITY): Payer: Self-pay

## 2024-02-09 ENCOUNTER — Other Ambulatory Visit: Payer: Self-pay

## 2024-02-14 ENCOUNTER — Other Ambulatory Visit: Payer: Self-pay | Admitting: Nurse Practitioner

## 2024-02-14 ENCOUNTER — Other Ambulatory Visit (HOSPITAL_COMMUNITY): Payer: Self-pay

## 2024-02-14 ENCOUNTER — Other Ambulatory Visit: Payer: Self-pay

## 2024-02-14 MED ORDER — ATORVASTATIN CALCIUM 80 MG PO TABS
80.0000 mg | ORAL_TABLET | Freq: Every day | ORAL | 1 refills | Status: AC
  Filled 2024-02-14: qty 90, 90d supply, fill #0
  Filled 2024-04-21: qty 90, 90d supply, fill #1

## 2024-02-14 NOTE — Progress Notes (Signed)
 Specialty Pharmacy Refill Coordination Note  Donald Wheeler is a 53 y.o. male contacted today regarding refills of specialty medication(s) Bictegravir-Emtricitab-Tenofov (Biktarvy )   Patient requested [name] at [facility] [place] date: 02/18/24   Medication will be filled on: 02/17/24

## 2024-02-16 ENCOUNTER — Other Ambulatory Visit: Payer: Self-pay

## 2024-02-17 ENCOUNTER — Other Ambulatory Visit (HOSPITAL_COMMUNITY): Payer: Self-pay

## 2024-03-02 ENCOUNTER — Ambulatory Visit: Payer: Self-pay | Admitting: Nurse Practitioner

## 2024-03-08 ENCOUNTER — Other Ambulatory Visit (HOSPITAL_COMMUNITY): Payer: Self-pay

## 2024-03-13 ENCOUNTER — Other Ambulatory Visit (HOSPITAL_COMMUNITY): Payer: Self-pay

## 2024-03-15 ENCOUNTER — Other Ambulatory Visit: Payer: Self-pay

## 2024-03-15 ENCOUNTER — Other Ambulatory Visit (HOSPITAL_COMMUNITY): Payer: Self-pay

## 2024-03-15 ENCOUNTER — Other Ambulatory Visit: Payer: Self-pay | Admitting: Pharmacy Technician

## 2024-03-15 NOTE — Progress Notes (Signed)
 Specialty Pharmacy Refill Coordination Note  Donald Wheeler is a 53 y.o. male contacted today regarding refills of specialty medication(s) Bictegravir-Emtricitab-Tenofov (Biktarvy )   Patient requested [name] at [facility] [place] date: 03/20/24   Medication will be filled on: 03/17/24

## 2024-03-18 ENCOUNTER — Other Ambulatory Visit (HOSPITAL_COMMUNITY): Payer: Self-pay

## 2024-03-20 NOTE — Progress Notes (Unsigned)
 "     Cardiology Office Note Date:  03/20/2024  ID:  Donald Wheeler, DOB 06/13/1970, MRN [id number] PCP:  Donald Bascom RAMAN, NP  Cardiologist:  Donald VEAR Ren Donley, MD  No chief complaint on file.    Problems CP TTE 1/19: 55-60%, mild LVH CVA DM (HA1C 6.9 9/25) CKDIII HIV M: AE10, AN80, LN100  Visits  1/26: TTE, LP, CCTA, EN10     ROS: Please see the history of present illness. All other systems are reviewed and negative.   Past Medical History:  Diagnosis Date   Bell's palsy 09/07/2013   Diabetes mellitus without complication (HCC) 12/2017   NEW ONSET    HIV (human immunodeficiency virus infection) (HCC)    dx'd 2000s   Hypertension    Stroke (HCC) 04/09/2017   vs TIA w/acute onset of right-sided weakness as well as trouble speaking/notes 04/09/2017    Past Surgical History:  Procedure Laterality Date   HEMORRHOID SURGERY      Current Outpatient Medications  Medication Sig Dispense Refill   amLODipine  (NORVASC ) 10 MG tablet Take 1 tablet (10 mg total) by mouth daily. 90 tablet 3   atorvastatin  (LIPITOR ) 80 MG tablet Take 1 tablet (80 mg total) by mouth daily. 90 tablet 1   bictegravir-emtricitabine -tenofovir  AF (BIKTARVY ) 50-200-25 MG TABS tablet Take 1 tablet by mouth daily. 30 tablet 11   blood glucose meter kit and supplies use twice daily 1 each 0   glucose blood (ONETOUCH ULTRA TEST) test strip Use to test blood sugar twice daily as directed 100 each 3   Lancet Device MISC Use to check blood sugar twice daily. May substitute to any manufacturer covered by patient's insurance. 1 each 0   Lancets (FREESTYLE) lancets Use as directed to test blood sugar twice daily 100 each 3   Lancets Misc. MISC May substitute to any manufacturer covered by patient's insurance. 100 each 3   lidocaine  (LIDODERM ) 5 % Place 1 patch onto the skin daily. Remove & Discard patch within 12 hours or as directed by MD 30 patch 0   losartan  (COZAAR ) 100 MG tablet Take 1 tablet (100 mg total) by  mouth daily. (Patient not taking: Reported on 12/13/2023) 30 tablet 6   losartan  (COZAAR ) 100 MG tablet Take 1 tablet (100 mg total) by mouth daily. 30 tablet 6   losartan  (COZAAR ) 50 MG tablet Take 1 tablet (50 mg total) by mouth daily. (Patient not taking: Reported on 12/13/2023) 30 tablet 11   losartan  (COZAAR ) 50 MG tablet Take 1 tablet (50 mg total) by mouth daily. (Patient not taking: Reported on 12/13/2023) 90 tablet 1   metFORMIN  (GLUCOPHAGE ) 1000 MG tablet Take 1 tablet (1,000 mg total) by mouth daily with breakfast. 90 tablet 3   metFORMIN  (GLUCOPHAGE ) 1000 MG tablet Take 1 tablet (1,000 mg total) by mouth 2 (two) times daily with a meal. 180 tablet 3   sildenafil  (VIAGRA ) 100 MG tablet Take 0.5-1 tablets (50-100 mg total) by mouth daily as needed for erectile dysfunction. 5 tablet 11   No current facility-administered medications for this visit.    Allergies:   Patient has no known allergies.   Social History:  see above  Family History:  see above  PHYSICAL EXAM: VS:  There were no vitals taken for this visit. , BMI There is no height or weight on file to calculate BMI. GEN: Well nourished, well developed, in no acute distress HEENT: normal Neck: no JVD, carotid bruits, or masses Cardiac: ***RRR; no  murmurs, rubs, or gallops,no edema  Respiratory:  CTAB bilaterally, normal work of breathing GI: soft, nontender, nondistended, + BS Extremities: No LE edema Skin: warm and dry, no rash Neuro:  Strength and sensation are intact  EKG: ***  Recent Labs: Reviewed  Studies: Reviewed  ASSESSMENT AND PLAN: Donald Wheeler is a 54 y.o. male who presents for new visit.    Signed, Donald VEAR Ren Donley, MD  03/20/2024 3:01 PM    [facility] "

## 2024-03-24 ENCOUNTER — Ambulatory Visit

## 2024-03-24 DIAGNOSIS — I10 Essential (primary) hypertension: Secondary | ICD-10-CM

## 2024-03-24 DIAGNOSIS — R0789 Other chest pain: Secondary | ICD-10-CM

## 2024-03-24 DIAGNOSIS — E1129 Type 2 diabetes mellitus with other diabetic kidney complication: Secondary | ICD-10-CM

## 2024-03-24 DIAGNOSIS — N1831 Chronic kidney disease, stage 3a: Secondary | ICD-10-CM

## 2024-03-25 ENCOUNTER — Other Ambulatory Visit (HOSPITAL_COMMUNITY): Payer: Self-pay

## 2024-04-07 ENCOUNTER — Other Ambulatory Visit: Payer: Self-pay

## 2024-04-10 ENCOUNTER — Other Ambulatory Visit: Payer: Self-pay

## 2024-04-10 NOTE — Progress Notes (Signed)
 Specialty Pharmacy Refill Coordination Note  Donald Wheeler is a 54 y.o. male contacted today regarding refills of specialty medication(s) Bictegravir-Emtricitab-Tenofov (Biktarvy )   Patient requested [name] at [facility] [place] date: 04/14/24   Medication will be filled on: 04/13/24

## 2024-04-13 ENCOUNTER — Other Ambulatory Visit: Payer: Self-pay

## 2024-04-21 ENCOUNTER — Other Ambulatory Visit (HOSPITAL_COMMUNITY): Payer: Self-pay
